# Patient Record
Sex: Male | Born: 1935
Health system: Southern US, Community
[De-identification: ages and names within clinical notes are randomized; demographics above are authoritative.]

## PROBLEM LIST (undated history)

## (undated) DIAGNOSIS — K5732 Diverticulitis of large intestine without perforation or abscess without bleeding: Secondary | ICD-10-CM

## (undated) DIAGNOSIS — N529 Male erectile dysfunction, unspecified: Secondary | ICD-10-CM

## (undated) DIAGNOSIS — E559 Vitamin D deficiency, unspecified: Secondary | ICD-10-CM

## (undated) DIAGNOSIS — I10 Essential (primary) hypertension: Secondary | ICD-10-CM

## (undated) DIAGNOSIS — R739 Hyperglycemia, unspecified: Secondary | ICD-10-CM

## (undated) HISTORY — DX: Hyperglycemia, unspecified: R73.9

## (undated) HISTORY — PX: HERNIA REPAIR: SHX51

## (undated) HISTORY — DX: Diverticulitis of large intestine without perforation or abscess without bleeding: K57.32

## (undated) HISTORY — DX: Essential (primary) hypertension: I10

## (undated) HISTORY — DX: Vitamin D deficiency, unspecified: E55.9

## (undated) HISTORY — DX: Male erectile dysfunction, unspecified: N52.9

## (undated) SURGERY — ENDOSCOPIC RETROGRADE CHOLANGIOPANCREATOGRAPHY (ERCP)
Anesthesia: General

---

## 2009-02-24 ENCOUNTER — Inpatient Hospital Stay: Payer: Self-pay | Admitting: Specialist

## 2011-07-10 ENCOUNTER — Emergency Department: Payer: Self-pay | Admitting: Emergency Medicine

## 2011-10-16 ENCOUNTER — Ambulatory Visit: Payer: Self-pay | Admitting: Gastroenterology

## 2011-10-17 LAB — PATHOLOGY REPORT

## 2013-12-22 ENCOUNTER — Encounter: Payer: Self-pay | Admitting: *Deleted

## 2013-12-29 ENCOUNTER — Encounter (INDEPENDENT_AMBULATORY_CARE_PROVIDER_SITE_OTHER): Payer: Self-pay

## 2013-12-29 ENCOUNTER — Encounter: Payer: Self-pay | Admitting: Cardiovascular Disease

## 2013-12-29 ENCOUNTER — Ambulatory Visit (INDEPENDENT_AMBULATORY_CARE_PROVIDER_SITE_OTHER): Payer: Medicare PPO | Admitting: Cardiovascular Disease

## 2013-12-29 VITALS — BP 152/94 | HR 70 | Ht 72.0 in | Wt 248.2 lb

## 2013-12-29 DIAGNOSIS — R0602 Shortness of breath: Secondary | ICD-10-CM

## 2013-12-29 DIAGNOSIS — R0600 Dyspnea, unspecified: Secondary | ICD-10-CM | POA: Insufficient documentation

## 2013-12-29 DIAGNOSIS — I10 Essential (primary) hypertension: Secondary | ICD-10-CM

## 2013-12-29 MED ORDER — ERGOCALCIFEROL 1.25 MG (50000 UT) PO CAPS: 50000.0000 [IU] | ORAL_CAPSULE | ORAL | Status: DC

## 2013-12-29 NOTE — Assessment & Plan Note (Signed)
BP is elevated but he states that his BP has been well controlled at home He may need addition of HCTZ Advised him to avoid salt and salty foods. Will see him in 6 months.

## 2013-12-29 NOTE — Assessment & Plan Note (Signed)
Blake Rivera presents for further evaluation of his shortness of breath. He has a history of hypertension and has evidence of LVH with repolarization changes on EKG. He also has a long history of cigarette smoking and may have some degree of COPD. We will get an echocardiogram for further evaluation of his LV function and cardiac size. We will also schedule him for set of pulmonary function tests.  He may need to be referred to [facility] pulmonary group if he's found have significant lung disease.  I will see him in 6 months.   Will have him DC his Vit. E since it was not found to be beneficial from a cardiac standpoint.

## 2013-12-29 NOTE — Patient Instructions (Addendum)
Your physician has requested that you have an echocardiogram. Echocardiography is a painless test that uses sound waves to create images of your heart. It provides your doctor with information about the size and shape of your heart and how well your heart's chambers and valves are working. This procedure takes approximately one hour. There are no restrictions for this procedure.   Your physician has recommended you make the following change in your medication:  Stop your Vitamin E   Your physician has recommended that you have a pulmonary function test. Pulmonary Function Tests are a group of tests that measure how well air moves in and out of your lungs. No prep for this test.  You will be contacted with the date and time.   Your physician wants you to follow-up in: 6 months with Dr. Acie Fredrickson. You will receive a reminder letter in the mail two months in advance. If you don't receive a letter, please call our office to schedule the follow-up appointment.

## 2013-12-29 NOTE — Progress Notes (Signed)
London Sheer Date of Birth  1934/10/31       [facility]    [employer] [address], [place], [place]  [address]   [place],   [address] [address]   Fax  [phone]     Fax [phone]  Problem List: 1. Shortness of breath 2. Hypertension  History of Present Illness:  Mr Harkless is a 79 yo who is seen today for further evaluation of his dyspnea.  He describes DOE while playing golf or walking at his grandson's campus [facility] )  Has been having DOE for several months. No CP. Lasts for only a few minuest or until he stops walking.   No sweats, no syncope.   No PND or orthopnea  Developed HTN after he stopped working.  He is a retired Education officer, museum in [place]. Now plays golf 3 times a week.    Takes his BP every day - readings are typically very well controlled. Is higher today.   Smokes 1 pack every 4 days. ETOH - 1-2 drinks at night.    Fhx:  No caridiac problems that he is aware of     Current Outpatient Prescriptions on File Prior to Visit  Medication Sig Dispense Refill  . amLODipine (NORVASC) 5 MG tablet Take 5 mg by mouth daily.    Marland Kitchen aspirin 81 MG tablet Take 81 mg by mouth daily.    . ergocalciferol (VITAMIN D2) 50000 UNITS capsule Take 50,000 Units by mouth once a week.    . Multiple Vitamins-Minerals (CENTRUM SILVER PO) Take by mouth daily.    . tadalafil (CIALIS) 20 MG tablet Take 20 mg by mouth daily as needed for erectile dysfunction.     No current facility-administered medications on file prior to visit.    Allergies  Allergen Reactions  . Lisinopril     Angioedema  . Omacor  [Omega-3-Acid Ethyl Esters]     Other reaction(s): Rash  . Zocor  [Simvastatin]     Muscle Pain    Past Medical History  Diagnosis Date  . Hypertension   . Vitamin D deficiency   . ED (erectile dysfunction)   . Hyperglycemia   . Diverticulitis of colon     Past  Surgical History  Procedure Laterality Date  . Hernia repair      History  Smoking status  . Current Some Day Smoker -- 25 years  Smokeless tobacco  . Not on file    History  Alcohol Use  . Yes    Comment: moderate    Family History  Problem Relation Age of Onset  . Heart disease Mother   . Heart disease Brother     Reviw of Systems:  Reviewed in the HPI.  All other systems are negative.  Physical Exam: Blood pressure 152/94, pulse 70, height 6' (1.829 m), weight 248 lb 4 oz (112.605 kg). Wt Readings from Last 3 Encounters:  12/29/13 248 lb 4 oz (112.605 kg)     General: Well developed, well nourished, in no acute distress.  Head: Normocephalic, atraumatic, sclera non-icteric, mucus membranes are moist,   Neck: Supple. Carotids are 2 + without bruits. No JVD   Lungs: Clear   Heart: RR, no significant murmur   Abdomen: Soft, non-tender, non-distended with normal bowel sounds.  Msk:  Strength and tone are normal   Extremities: No clubbing or cyanosis. No edema.  Distal  pedal pulses are 2+ and equal    Neuro: CN II - XII intact.  Alert and oriented X 3.   Psych:  Normal   ECG: 12/29/2013: Normal sinus rhythm at 70. Voltage for left ventricular hypertrophy with repolarization abnormalities.   Assessment / Plan:

## 2014-01-22 ENCOUNTER — Other Ambulatory Visit (INDEPENDENT_AMBULATORY_CARE_PROVIDER_SITE_OTHER): Payer: Medicare PPO

## 2014-01-22 ENCOUNTER — Other Ambulatory Visit: Payer: Self-pay

## 2014-01-22 DIAGNOSIS — R0602 Shortness of breath: Secondary | ICD-10-CM

## 2014-01-22 DIAGNOSIS — R0600 Dyspnea, unspecified: Secondary | ICD-10-CM

## 2014-01-23 ENCOUNTER — Other Ambulatory Visit: Payer: Medicare PPO

## 2014-07-16 ENCOUNTER — Ambulatory Visit (INDEPENDENT_AMBULATORY_CARE_PROVIDER_SITE_OTHER): Payer: Commercial Managed Care - HMO | Admitting: Family Medicine

## 2014-07-16 ENCOUNTER — Encounter: Payer: Self-pay | Admitting: Family Medicine

## 2014-07-16 VITALS — BP 130/84 | HR 79 | Temp 98.3°F | Ht 72.0 in | Wt 248.2 lb

## 2014-07-16 DIAGNOSIS — N529 Male erectile dysfunction, unspecified: Secondary | ICD-10-CM | POA: Insufficient documentation

## 2014-07-16 DIAGNOSIS — F17200 Nicotine dependence, unspecified, uncomplicated: Secondary | ICD-10-CM | POA: Insufficient documentation

## 2014-07-16 DIAGNOSIS — B029 Zoster without complications: Secondary | ICD-10-CM | POA: Diagnosis not present

## 2014-07-16 DIAGNOSIS — R739 Hyperglycemia, unspecified: Secondary | ICD-10-CM | POA: Insufficient documentation

## 2014-07-16 DIAGNOSIS — R9431 Abnormal electrocardiogram [ECG] [EKG]: Secondary | ICD-10-CM | POA: Insufficient documentation

## 2014-07-16 DIAGNOSIS — R209 Unspecified disturbances of skin sensation: Secondary | ICD-10-CM | POA: Insufficient documentation

## 2014-07-16 DIAGNOSIS — M25569 Pain in unspecified knee: Secondary | ICD-10-CM | POA: Insufficient documentation

## 2014-07-16 DIAGNOSIS — I10 Essential (primary) hypertension: Secondary | ICD-10-CM | POA: Insufficient documentation

## 2014-07-16 DIAGNOSIS — Z8719 Personal history of other diseases of the digestive system: Secondary | ICD-10-CM | POA: Insufficient documentation

## 2014-07-16 DIAGNOSIS — E559 Vitamin D deficiency, unspecified: Secondary | ICD-10-CM | POA: Insufficient documentation

## 2014-07-16 DIAGNOSIS — M25519 Pain in unspecified shoulder: Secondary | ICD-10-CM | POA: Insufficient documentation

## 2014-07-16 MED ORDER — LIDOCAINE 5 % EX OINT: 1.0000 "application " | TOPICAL_OINTMENT | Freq: Two times a day (BID) | CUTANEOUS | Status: DC | PRN

## 2014-07-16 MED ORDER — VALACYCLOVIR HCL 1 G PO TABS: 1000.0000 mg | ORAL_TABLET | Freq: Three times a day (TID) | ORAL | Status: DC

## 2014-07-16 MED ORDER — OXYCODONE-ACETAMINOPHEN 5-325 MG PO TABS: 1.0000 | ORAL_TABLET | Freq: Three times a day (TID) | ORAL | Status: DC | PRN

## 2014-07-16 NOTE — Progress Notes (Signed)
Name: Blake Rivera   MRN: [id number]    DOB: May 01, 1934   Date:07/16/2014       Progress Note  Subjective  Chief Complaint  Chief Complaint  Patient presents with  . Acute Visit    shingles    Rash This is a new problem. The current episode started yesterday. The problem has been gradually worsening since onset. The affected locations include the abdomen (itching and burning preceded the rash in the affected area). The rash is characterized by burning, itchiness and pain. He was exposed to nothing. Pertinent negatives include no fatigue, fever or joint pain. Past treatments include anti-itch cream. The treatment provided no relief.      Past Medical History  Diagnosis Date  . Hypertension   . Vitamin D deficiency   . ED (erectile dysfunction)   . Hyperglycemia   . Diverticulitis of colon     Past Surgical History  Procedure Laterality Date  . Hernia repair      Family History  Problem Relation Age of Onset  . Heart disease Mother   . Heart disease Brother     History   Social History  . Marital Status: Married    Spouse Name: N/A  . Number of Children: N/A  . Years of Education: N/A   Occupational History  . Not on file.   Social History Main Topics  . Smoking status: Current Some Day Smoker -- 25 years  . Smokeless tobacco: Not on file  . Alcohol Use: Yes     Comment: moderate  . Drug Use: No  . Sexual Activity: Not on file   Other Topics Concern  . Not on file   Social History Narrative     Current outpatient prescriptions:  .  amLODipine (NORVASC) 5 MG tablet, Take 5 mg by mouth daily., Disp: , Rfl:  .  aspirin 81 MG tablet, Take 81 mg by mouth daily., Disp: , Rfl:  .  ergocalciferol (VITAMIN D2) 50000 UNITS capsule, Take 1 capsule (50,000 Units total) by mouth once a week., Disp: , Rfl:  .  Multiple Vitamins-Minerals (CENTRUM SILVER PO), Take by mouth daily., Disp: , Rfl:  .  tadalafil (CIALIS) 20 MG tablet, Take 20 mg by mouth daily as  needed for erectile dysfunction., Disp: , Rfl:   Allergies  Allergen Reactions  . Lisinopril     Angioedema  . Omacor  [Omega-3-Acid Ethyl Esters]     Other reaction(s): Rash  . Zocor  [Simvastatin]     Muscle Pain     Review of Systems  Constitutional: Negative for fever and fatigue.  Musculoskeletal: Negative for joint pain.  Skin: Positive for rash.      Objective  Filed Vitals:   07/16/14 1422  BP: 130/84  Pulse: 79  Temp: 98.3 F (36.8 C)  TempSrc: Oral  Height: 6' (1.829 m)  Weight: 248 lb 3.2 oz (112.583 kg)  SpO2: 91%    Physical Exam  Constitutional: He is well-developed, well-nourished, and in no distress.  Skin: Burn and rash noted. Rash is maculopapular. There is erythema.     Erythematous intensely pruritic maculo-papular lesions on the abdomen, c/w early onset shingles.       No results found for this or any previous visit (from the past 2160 hour(s)).   Assessment & Plan 1. Shingles Symptoms consistent with shingles infection. Patient will be started on Valtrex, lidocaine topical for itch relief, and as needed opioid analgesics for relief of pain. Return in one  week if symptoms not improving. - valACYclovir (VALTREX) 1000 MG tablet; Take 1 tablet (1,000 mg total) by mouth 3 (three) times daily.  Dispense: 21 tablet; Refill: 0 - lidocaine (XYLOCAINE) 5 % ointment; Apply 1 application topically 2 (two) times daily as needed.  Dispense: 50 g; Refill: 0 - oxyCODONE-acetaminophen (ROXICET) 5-325 MG per tablet; Take 1 tablet by mouth every 8 (eight) hours as needed for moderate pain or severe pain.  Dispense: 21 tablet; Refill: 0    Eliese Kerwood Asad A[facility] 07/16/2014 2:38 PM

## 2014-07-30 ENCOUNTER — Ambulatory Visit: Payer: Commercial Managed Care - HMO | Admitting: Family Medicine

## 2014-08-19 ENCOUNTER — Ambulatory Visit: Payer: Self-pay | Admitting: Family Medicine

## 2014-08-21 ENCOUNTER — Other Ambulatory Visit: Payer: Self-pay | Admitting: Family Medicine

## 2014-08-24 ENCOUNTER — Other Ambulatory Visit: Payer: Self-pay | Admitting: Family Medicine

## 2014-08-24 MED ORDER — AMLODIPINE BESYLATE 5 MG PO TABS
5.0000 mg | ORAL_TABLET | Freq: Every day | ORAL | Status: DC
Start: 2014-08-24 — End: 2015-02-25

## 2014-08-24 NOTE — Telephone Encounter (Signed)
Medication has been refilled and sent to Pharmacy

## 2014-08-26 ENCOUNTER — Ambulatory Visit (INDEPENDENT_AMBULATORY_CARE_PROVIDER_SITE_OTHER): Payer: Commercial Managed Care - HMO | Admitting: Family Medicine

## 2014-08-26 ENCOUNTER — Encounter: Payer: Self-pay | Admitting: Family Medicine

## 2014-08-26 VITALS — BP 132/77 | HR 71 | Temp 98.2°F | Resp 18 | Ht 72.0 in | Wt 250.0 lb

## 2014-08-26 DIAGNOSIS — Z0000 Encounter for general adult medical examination without abnormal findings: Secondary | ICD-10-CM | POA: Diagnosis not present

## 2014-08-26 NOTE — Progress Notes (Signed)
Name: Blake Rivera   MRN: [id number]    DOB: 08-Dec-1934   Date:08/26/2014       Progress Note  Subjective  Chief Complaint  Chief Complaint  Patient presents with  . Annual Exam    CPE  . Medication Refill    HPI Pt. Is here for a Comprehensive Annual physical exam. He is doing well.  Past Medical History  Diagnosis Date  . Hypertension   . Vitamin D deficiency   . ED (erectile dysfunction)   . Hyperglycemia   . Diverticulitis of colon     Past Surgical History  Procedure Laterality Date  . Hernia repair      Family History  Problem Relation Age of Onset  . Heart disease Mother   . Heart disease Brother     Social History   Social History  . Marital Status: Married    Spouse Name: N/A  . Number of Children: N/A  . Years of Education: N/A   Occupational History  . Not on file.   Social History Main Topics  . Smoking status: Former Smoker -- 25 years    Quit date: 05/18/2014  . Smokeless tobacco: Never Used  . Alcohol Use: 0.0 oz/week    0 Standard drinks or equivalent per week     Comment: moderate  . Drug Use: No  . Sexual Activity: Not on file   Other Topics Concern  . Not on file   Social History Narrative     Current outpatient prescriptions:  .  amLODipine (NORVASC) 5 MG tablet, Take 1 tablet (5 mg total) by mouth daily., Disp: 30 tablet, Rfl: 0 .  aspirin 81 MG tablet, Take 81 mg by mouth daily., Disp: , Rfl:  .  ergocalciferol (VITAMIN D2) 50000 UNITS capsule, Take 1 capsule (50,000 Units total) by mouth once a week., Disp: , Rfl:  .  Multiple Vitamins-Minerals (CENTRUM SILVER PO), Take by mouth daily., Disp: , Rfl:  .  tadalafil (CIALIS) 20 MG tablet, Take 20 mg by mouth daily as needed for erectile dysfunction., Disp: , Rfl:  .  lidocaine (XYLOCAINE) 5 % ointment, Apply 1 application topically 2 (two) times daily as needed. (Patient not taking: Reported on 08/26/2014), Disp: 50 g, Rfl: 0 .  oxyCODONE-acetaminophen (ROXICET) 5-325 MG per  tablet, Take 1 tablet by mouth every 8 (eight) hours as needed for moderate pain or severe pain. (Patient not taking: Reported on 08/26/2014), Disp: 21 tablet, Rfl: 0 .  valACYclovir (VALTREX) 1000 MG tablet, Take 1 tablet (1,000 mg total) by mouth 3 (three) times daily. (Patient not taking: Reported on 08/26/2014), Disp: 21 tablet, Rfl: 0  Allergies  Allergen Reactions  . Lisinopril     Angioedema  . Omacor  [Omega-3-Acid Ethyl Esters]     Other reaction(s): Rash  . Zocor  [Simvastatin]     Muscle Pain     Review of Systems  Constitutional: Negative for fever, chills, weight loss and malaise/fatigue.  HENT: Negative for ear pain and sore throat.   Eyes: Negative for blurred vision.  Respiratory: Negative for cough, sputum production and shortness of breath.   Cardiovascular: Negative for chest pain and palpitations.  Gastrointestinal: Negative for heartburn, nausea, vomiting, abdominal pain, diarrhea, constipation and blood in stool.  Genitourinary: Negative for dysuria, frequency and hematuria.  Musculoskeletal: Positive for myalgias. Negative for back pain and neck pain.  Skin: Negative for rash.  Neurological: Negative for dizziness, tingling, seizures, loss of consciousness and headaches.  Endo/Heme/Allergies: Does  not bruise/bleed easily.  Psychiatric/Behavioral: Negative for depression. The patient is not nervous/anxious.      Objective  Filed Vitals:   08/26/14 0908  BP: 132/77  Pulse: 71  Temp: 98.2 F (36.8 C)  TempSrc: Oral  Resp: 18  Height: 6' (1.829 m)  Weight: 250 lb (113.399 kg)  SpO2: 93%    Physical Exam  Constitutional: He is oriented to person, place, and time and well-developed, well-nourished, and in no distress.  HENT:  Head: Normocephalic.  Eyes: Pupils are equal, round, and reactive to light.  Neck: Normal range of motion. Neck supple.  Cardiovascular: Normal rate and regular rhythm.   Pulmonary/Chest: Effort normal and breath sounds normal.   Abdominal: Soft. Bowel sounds are normal.  Genitourinary: Rectum normal and prostate normal.  Musculoskeletal: Normal range of motion. He exhibits no edema or tenderness.  Neurological: He is alert and oriented to person, place, and time.  Skin: Skin is warm and dry.  Psychiatric: Memory, affect and judgment normal.  Nursing note and vitals reviewed.    Assessment & Plan  1. Annual physical exam Last colonoscopy was in 2012. Hemoccult testing in office today was negative.  - CBC with Differential - Comprehensive metabolic panel - HgB A0O - Vitamin D (25 hydroxy) - PSA - TSH - Lipid Profile   Yarrow Linhart Asad A[facility] 08/26/2014 9:42 AM

## 2014-08-27 LAB — CBC WITH DIFFERENTIAL/PLATELET
BASOS ABS: 0 10*3/uL (ref 0.0–0.2)
Basos: 0 %
EOS (ABSOLUTE): 0.1 10*3/uL (ref 0.0–0.4)
EOS: 2 %
HEMOGLOBIN: 15.1 g/dL (ref 12.6–17.7)
Hematocrit: 46 % (ref 37.5–51.0)
Immature Grans (Abs): 0 10*3/uL (ref 0.0–0.1)
Immature Granulocytes: 0 %
LYMPHS ABS: 2.3 10*3/uL (ref 0.7–3.1)
Lymphs: 39 %
MCH: 29.6 pg (ref 26.6–33.0)
MCHC: 32.8 g/dL (ref 31.5–35.7)
MCV: 90 fL (ref 79–97)
MONOCYTES: 8 %
Monocytes Absolute: 0.5 10*3/uL (ref 0.1–0.9)
NEUTROS ABS: 2.9 10*3/uL (ref 1.4–7.0)
Neutrophils: 51 %
Platelets: 202 10*3/uL (ref 150–379)
RBC: 5.1 x10E6/uL (ref 4.14–5.80)
RDW: 14.2 % (ref 12.3–15.4)
WBC: 5.9 10*3/uL (ref 3.4–10.8)

## 2014-08-27 LAB — COMPREHENSIVE METABOLIC PANEL
A/G RATIO: 1.7 (ref 1.1–2.5)
ALBUMIN: 4.5 g/dL (ref 3.5–4.8)
ALK PHOS: 75 IU/L (ref 39–117)
ALT: 20 IU/L (ref 0–44)
AST: 25 IU/L (ref 0–40)
BILIRUBIN TOTAL: 0.4 mg/dL (ref 0.0–1.2)
BUN / CREAT RATIO: 8 — AB (ref 10–22)
BUN: 9 mg/dL (ref 8–27)
CHLORIDE: 101 mmol/L (ref 97–108)
CO2: 28 mmol/L (ref 18–29)
Calcium: 9.4 mg/dL (ref 8.6–10.2)
Creatinine, Ser: 1.16 mg/dL (ref 0.76–1.27)
GFR, EST AFRICAN AMERICAN: 69 mL/min/{1.73_m2} (ref >59)
GFR, EST NON AFRICAN AMERICAN: 60 mL/min/{1.73_m2} (ref >59)
GLUCOSE: 117 mg/dL — AB (ref 65–99)
Globulin, Total: 2.6 g/dL (ref 1.5–4.5)
Potassium: 4.9 mmol/L (ref 3.5–5.2)
Sodium: 140 mmol/L (ref 134–144)
Total Protein: 7.1 g/dL (ref 6.0–8.5)

## 2014-08-27 LAB — HEMOGLOBIN A1C
ESTIMATED AVERAGE GLUCOSE: 134 mg/dL
HEMOGLOBIN A1C: 6.3 % — AB (ref 4.8–5.6)

## 2014-08-27 LAB — LIPID PANEL
Chol/HDL Ratio: 3.9 ratio units (ref 0.0–5.0)
Cholesterol, Total: 195 mg/dL (ref 100–199)
HDL: 50 mg/dL (ref >39)
LDL Calculated: 116 mg/dL — ABNORMAL HIGH (ref 0–99)
TRIGLYCERIDES: 147 mg/dL (ref 0–149)
VLDL Cholesterol Cal: 29 mg/dL (ref 5–40)

## 2014-08-27 LAB — TSH: TSH: 2 u[IU]/mL (ref 0.450–4.500)

## 2014-08-27 LAB — PSA: PROSTATE SPECIFIC AG, SERUM: 1.5 ng/mL (ref 0.0–4.0)

## 2014-08-27 LAB — VITAMIN D 25 HYDROXY (VIT D DEFICIENCY, FRACTURES): Vit D, 25-Hydroxy: 20.2 ng/mL — ABNORMAL LOW (ref 30.0–100.0)

## 2014-08-28 ENCOUNTER — Telehealth: Payer: Self-pay | Admitting: Family Medicine

## 2014-08-28 MED ORDER — ERGOCALCIFEROL 1.25 MG (50000 UT) PO CAPS: 50000.0000 [IU] | ORAL_CAPSULE | ORAL | Status: DC

## 2014-08-28 NOTE — Telephone Encounter (Signed)
Medication has been refilled and sent to [facility] [address]

## 2014-08-28 NOTE — Telephone Encounter (Signed)
Patient was seen the other day for his annual CPE. Was told that his Vitamin D level was low. States that he normally get a prescription for the Vitamin D. Please send to [facility]. Also, patient is requesting a copy of his physical.

## 2014-11-10 ENCOUNTER — Telehealth: Payer: Self-pay | Admitting: Cardiovascular Disease

## 2014-11-10 NOTE — Telephone Encounter (Signed)
3 attempts made to schedule OD fu appt.  LM with spouse for patient to schedule.  Patient will call office when ready to schedule appt.     Deleting Recall.

## 2014-11-17 ENCOUNTER — Other Ambulatory Visit: Payer: Self-pay | Admitting: Family Medicine

## 2014-11-25 ENCOUNTER — Encounter: Payer: Self-pay | Admitting: Family Medicine

## 2014-11-25 ENCOUNTER — Ambulatory Visit (INDEPENDENT_AMBULATORY_CARE_PROVIDER_SITE_OTHER): Payer: Commercial Managed Care - HMO | Admitting: Family Medicine

## 2014-11-25 VITALS — BP 132/77 | HR 96 | Temp 99.1°F | Resp 18 | Ht 72.0 in | Wt 250.9 lb

## 2014-11-25 DIAGNOSIS — E559 Vitamin D deficiency, unspecified: Secondary | ICD-10-CM | POA: Diagnosis not present

## 2014-11-25 DIAGNOSIS — R972 Elevated prostate specific antigen [PSA]: Secondary | ICD-10-CM | POA: Diagnosis not present

## 2014-11-25 DIAGNOSIS — E785 Hyperlipidemia, unspecified: Secondary | ICD-10-CM

## 2014-11-25 MED ORDER — ATORVASTATIN CALCIUM 10 MG PO TABS: 10.0000 mg | ORAL_TABLET | Freq: Every day | ORAL | Status: DC

## 2014-11-25 NOTE — Progress Notes (Signed)
Name: Blake Rivera   MRN: [id number]    DOB: [birth year]   Date:11/25/2014       Progress Note  Subjective  Chief Complaint  Chief Complaint  Patient presents with  . Medication Refill    vitamin D, check labs first    Hyperlipidemia This is a chronic problem. The problem is uncontrolled. Recent lipid tests were reviewed and are high. Exacerbating diseases include obesity. Pertinent negatives include no myalgias. He is currently on no antihyperlipidemic treatment. Risk factors for coronary artery disease include dyslipidemia, obesity, male sex and hypertension.   Vitamin D Deficiency Pt. Is here for follow up of Vitamin D deficiency. Last level, obtained in August 2016 was 20.2, following which, he was started on Vitamin D3 50000 units every 12 weeks. He has finished the prescription and now presenting for recheck.  Increase in PSA Pt. Presents for recheck of PSA. It was 1.5 in August 2016, which was higher than 0.9 in August 2015. Pt. Has no history of prostate cancer, no LUTS (dysuria, irritative voiding, change in stream etc). He presents to recheck PSA.  Past Medical History  Diagnosis Date  . Hypertension   . Vitamin D deficiency   . ED (erectile dysfunction)   . Hyperglycemia   . Diverticulitis of colon     Past Surgical History  Procedure Laterality Date  . Hernia repair      Family History  Problem Relation Age of Onset  . Heart disease Mother   . Heart disease Brother     Social History   Social History  . Marital Status: Married    Spouse Name: N/A  . Number of Children: N/A  . Years of Education: N/A   Occupational History  . Not on file.   Social History Main Topics  . Smoking status: Former Smoker -- 25 years    Quit date: 05/18/2014  . Smokeless tobacco: Never Used  . Alcohol Use: 0.0 oz/week    0 Standard drinks or equivalent per week     Comment: moderate  . Drug Use: No  . Sexual Activity: Not on file   Other Topics Concern  . Not on  file   Social History Narrative     Current outpatient prescriptions:  .  amLODipine (NORVASC) 5 MG tablet, Take 1 tablet (5 mg total) by mouth daily., Disp: 30 tablet, Rfl: 0 .  aspirin 81 MG tablet, Take 81 mg by mouth daily., Disp: , Rfl:  .  Multiple Vitamins-Minerals (CENTRUM SILVER PO), Take by mouth daily., Disp: , Rfl:  .  tadalafil (CIALIS) 20 MG tablet, Take 20 mg by mouth daily as needed for erectile dysfunction., Disp: , Rfl:  .  ergocalciferol (VITAMIN D2) 50000 UNITS capsule, Take 1 capsule (50,000 Units total) by mouth once a week. For 12 weeks (Patient not taking: Reported on 11/25/2014), Disp: 12 capsule, Rfl: 0 .  lidocaine (XYLOCAINE) 5 % ointment, Apply 1 application topically 2 (two) times daily as needed. (Patient not taking: Reported on 08/26/2014), Disp: 50 g, Rfl: 0 .  oxyCODONE-acetaminophen (ROXICET) 5-325 MG per tablet, Take 1 tablet by mouth every 8 (eight) hours as needed for moderate pain or severe pain. (Patient not taking: Reported on 08/26/2014), Disp: 21 tablet, Rfl: 0 .  valACYclovir (VALTREX) 1000 MG tablet, Take 1 tablet (1,000 mg total) by mouth 3 (three) times daily. (Patient not taking: Reported on 08/26/2014), Disp: 21 tablet, Rfl: 0  Allergies  Allergen Reactions  . Lisinopril  Angioedema  . Omacor  [Omega-3-Acid Ethyl Esters]     Other reaction(s): Rash  . Zocor  [Simvastatin]     Muscle Pain   Review of Systems  Constitutional: Negative for fever, chills and malaise/fatigue.  Genitourinary: Negative for dysuria and hematuria.  Musculoskeletal: Negative for myalgias and joint pain.   Objective  Filed Vitals:   11/25/14 0853  BP: 132/77  Pulse: 96  Temp: 99.1 F (37.3 C)  TempSrc: Oral  Resp: 18  Height: 6' (1.829 m)  Weight: 250 lb 14.4 oz (113.807 kg)  SpO2: 98%    Physical Exam  Constitutional: He is oriented to person, place, and time and well-developed, well-nourished, and in no distress.  HENT:  Head: Normocephalic and  atraumatic.  Cardiovascular: Normal rate.   Pulmonary/Chest: Effort normal and breath sounds normal. He has no wheezes.  Abdominal: Soft. Bowel sounds are normal. There is no tenderness.  Neurological: He is alert and oriented to person, place, and time.  Nursing note and vitals reviewed.   Assessment & Plan  1. Avitaminosis D  - Vitamin D (25 hydroxy)  2. Dyslipidemia  - atorvastatin (LIPITOR) 10 MG tablet; Take 1 tablet (10 mg total) by mouth daily.  Dispense: 90 tablet; Refill: 0  3. Rising PSA level  - PSA   Claiborne Stroble Asad A. [facility] 11/25/2014 9:02 AM

## 2014-11-26 ENCOUNTER — Telehealth: Payer: Self-pay | Admitting: Family Medicine

## 2014-11-26 LAB — VITAMIN D 25 HYDROXY (VIT D DEFICIENCY, FRACTURES): Vit D, 25-Hydroxy: 28.5 ng/mL — ABNORMAL LOW (ref 30.0–100.0)

## 2014-11-26 LAB — PSA: Prostate Specific Ag, Serum: 2.2 ng/mL (ref 0.0–4.0)

## 2014-11-26 NOTE — Telephone Encounter (Signed)
Pt would like for his Vit D to be called into [facility] [address]

## 2014-11-26 NOTE — Telephone Encounter (Signed)
Lab results have been reported to patient and he has been informed to take OTC Vitamin D 2000 units daily and we will recheck levels in 3-4 months per Dr. Manuella Ghazi

## 2015-02-25 ENCOUNTER — Ambulatory Visit (INDEPENDENT_AMBULATORY_CARE_PROVIDER_SITE_OTHER): Payer: Medicare Other | Admitting: Family Medicine

## 2015-02-25 ENCOUNTER — Encounter: Payer: Self-pay | Admitting: Family Medicine

## 2015-02-25 VITALS — BP 152/94 | HR 107 | Temp 98.5°F | Resp 20 | Ht 72.0 in | Wt 249.6 lb

## 2015-02-25 DIAGNOSIS — I10 Essential (primary) hypertension: Secondary | ICD-10-CM | POA: Diagnosis not present

## 2015-02-25 MED ORDER — AMLODIPINE BESYLATE 10 MG PO TABS: 10.0000 mg | ORAL_TABLET | Freq: Every day | ORAL | Status: DC

## 2015-02-25 NOTE — Progress Notes (Signed)
Name: Blake Rivera   MRN: [id number]    DOB: 08/06/1934   Date:02/25/2015       Progress Note  Subjective  Chief Complaint  Chief Complaint  Patient presents with  . Follow-up    3 mo / BP  . Hypertension  . Hyperlipidemia    Hypertension This is a chronic problem. The problem is uncontrolled. Pertinent negatives include no blurred vision, chest pain, headaches, palpitations or shortness of breath. Past treatments include calcium channel blockers.   BP elevated to 138/70 my repeat check 10 minutes later was 152/94. Patient usually has excellent blood pressure control, values ranging between 120 to 130 over 70s to 80s.  Past Medical History  Diagnosis Date  . Hypertension   . Vitamin D deficiency   . ED (erectile dysfunction)   . Hyperglycemia   . Diverticulitis of colon     Past Surgical History  Procedure Laterality Date  . Hernia repair      Family History  Problem Relation Age of Onset  . Heart disease Mother   . Heart disease Brother     Social History   Social History  . Marital Status: Married    Spouse Name: N/A  . Number of Children: N/A  . Years of Education: N/A   Occupational History  . Not on file.   Social History Main Topics  . Smoking status: Former Smoker -- 25 years    Quit date: 05/18/2014  . Smokeless tobacco: Never Used  . Alcohol Use: 0.0 oz/week    0 Standard drinks or equivalent per week     Comment: moderate  . Drug Use: No  . Sexual Activity: Not on file   Other Topics Concern  . Not on file   Social History Narrative     Current outpatient prescriptions:  .  amLODipine (NORVASC) 5 MG tablet, Take 1 tablet (5 mg total) by mouth daily., Disp: 30 tablet, Rfl: 0 .  aspirin 81 MG tablet, Take 81 mg by mouth daily., Disp: , Rfl:  .  atorvastatin (LIPITOR) 10 MG tablet, Take 1 tablet (10 mg total) by mouth daily., Disp: 90 tablet, Rfl: 0 .  Multiple Vitamins-Minerals (CENTRUM SILVER PO), Take by mouth daily., Disp: , Rfl:  .   tadalafil (CIALIS) 20 MG tablet, Take 20 mg by mouth daily as needed for erectile dysfunction., Disp: , Rfl:   Allergies  Allergen Reactions  . Lisinopril     Angioedema  . Omacor  [Omega-3-Acid Ethyl Esters]     Other reaction(s): Rash  . Zocor  [Simvastatin]     Muscle Pain     Review of Systems  Eyes: Negative for blurred vision.  Respiratory: Negative for shortness of breath.   Cardiovascular: Negative for chest pain and palpitations.  Neurological: Negative for headaches.     Objective  Filed Vitals:   02/25/15 0824  BP: 138/70  Pulse: 107  Temp: 98.5 F (36.9 C)  TempSrc: Oral  Resp: 20  Height: 6' (1.829 m)  Weight: 249 lb 9.6 oz (113.218 kg)  SpO2: 96%    Physical Exam  Constitutional: He is oriented to person, place, and time and well-developed, well-nourished, and in no distress.  HENT:  Head: Normocephalic and atraumatic.  Cardiovascular: Normal rate and regular rhythm.   Pulmonary/Chest: Effort normal and breath sounds normal.  Musculoskeletal: He exhibits no edema.  Neurological: He is alert and oriented to person, place, and time.  Nursing note and vitals reviewed.    Assessment &  Plan  1. Benign hypertension We will increase amlodipine to 10 mg daily, patient advised to check blood pressure frequently and return in one month. - amLODipine (NORVASC) 10 MG tablet; Take 1 tablet (10 mg total) by mouth daily.  Dispense: 30 tablet; Refill: 2   Amron Guerrette Asad A. [facility] 02/25/2015 8:45 AM

## 2015-03-23 ENCOUNTER — Ambulatory Visit: Payer: Commercial Managed Care - HMO | Admitting: Family Medicine

## 2015-03-25 ENCOUNTER — Ambulatory Visit (INDEPENDENT_AMBULATORY_CARE_PROVIDER_SITE_OTHER): Payer: Medicare Other | Admitting: Family Medicine

## 2015-03-25 ENCOUNTER — Encounter: Payer: Self-pay | Admitting: Family Medicine

## 2015-03-25 VITALS — BP 118/82 | HR 71 | Temp 98.6°F | Resp 16 | Ht 72.0 in | Wt 253.0 lb

## 2015-03-25 DIAGNOSIS — I10 Essential (primary) hypertension: Secondary | ICD-10-CM | POA: Diagnosis not present

## 2015-03-25 MED ORDER — AMLODIPINE BESYLATE 10 MG PO TABS
10.0000 mg | ORAL_TABLET | Freq: Every day | ORAL | Status: DC
Start: 2015-03-25 — End: 2015-09-16

## 2015-03-25 NOTE — Progress Notes (Signed)
Name: Blake Rivera   MRN: [id number]    DOB: Jun 29, 1934   Date:03/25/2015       Progress Note  Subjective  Chief Complaint  Chief Complaint  Patient presents with  . Follow-up    1 month  . Hypertension    increased Amlodipine to 10 mg last visit    Hypertension This is a chronic problem. The problem is controlled. Pertinent negatives include no blurred vision, chest pain, headaches or palpitations. Past treatments include calcium channel blockers. There are no compliance problems.  There is no history of kidney disease, CAD/MI or CVA.    Past Medical History  Diagnosis Date  . Hypertension   . Vitamin D deficiency   . ED (erectile dysfunction)   . Hyperglycemia   . Diverticulitis of colon     Past Surgical History  Procedure Laterality Date  . Hernia repair      Family History  Problem Relation Age of Onset  . Heart disease Mother   . Heart disease Brother     Social History   Social History  . Marital Status: Married    Spouse Name: N/A  . Number of Children: N/A  . Years of Education: N/A   Occupational History  . Not on file.   Social History Main Topics  . Smoking status: Former Smoker -- 25 years    Quit date: 05/18/2014  . Smokeless tobacco: Never Used  . Alcohol Use: 0.0 oz/week    0 Standard drinks or equivalent per week     Comment: moderate  . Drug Use: No  . Sexual Activity: Not on file   Other Topics Concern  . Not on file   Social History Narrative     Current outpatient prescriptions:  .  amLODipine (NORVASC) 10 MG tablet, Take 1 tablet (10 mg total) by mouth daily., Disp: 30 tablet, Rfl: 2 .  aspirin 81 MG tablet, Take 81 mg by mouth daily., Disp: , Rfl:  .  atorvastatin (LIPITOR) 10 MG tablet, Take 1 tablet (10 mg total) by mouth daily., Disp: 90 tablet, Rfl: 0 .  Multiple Vitamins-Minerals (CENTRUM SILVER PO), Take by mouth daily., Disp: , Rfl:  .  tadalafil (CIALIS) 20 MG tablet, Take 20 mg by mouth daily as needed for  erectile dysfunction., Disp: , Rfl:   Allergies  Allergen Reactions  . Lisinopril     Angioedema  . Omacor  [Omega-3-Acid Ethyl Esters]     Other reaction(s): Rash  . Zocor  [Simvastatin]     Muscle Pain     Review of Systems  Eyes: Negative for blurred vision.  Cardiovascular: Negative for chest pain, palpitations and leg swelling.  Neurological: Negative for headaches.     Objective  Filed Vitals:   03/25/15 0833  BP: 118/82  Pulse: 71  Temp: 98.6 F (37 C)  TempSrc: Oral  Resp: 16  Height: 6' (1.829 m)  Weight: 253 lb (114.76 kg)  SpO2: 92%    Physical Exam  Constitutional: He is oriented to person, place, and time and well-developed, well-nourished, and in no distress.  Cardiovascular: Normal rate and regular rhythm.   Pulmonary/Chest: Effort normal and breath sounds normal.  Musculoskeletal:       Right ankle: He exhibits no swelling.       Left ankle: He exhibits no swelling.  Neurological: He is alert and oriented to person, place, and time.  Nursing note and vitals reviewed.      Assessment & Plan  1.  Essential hypertension Blood pressure at goal. - amLODipine (NORVASC) 10 MG tablet; Take 1 tablet (10 mg total) by mouth daily.  Dispense: 90 tablet; Refill: 1    Saira Kramme [facility] 03/25/2015 8:54 AM

## 2015-09-16 ENCOUNTER — Other Ambulatory Visit: Payer: Self-pay | Admitting: Family Medicine

## 2015-09-16 DIAGNOSIS — I10 Essential (primary) hypertension: Secondary | ICD-10-CM

## 2015-11-01 ENCOUNTER — Telehealth: Payer: Self-pay | Admitting: Family Medicine

## 2015-11-01 NOTE — Telephone Encounter (Signed)
Called Pt to schedule AWV with [facility] and CPE with [id number]

## 2016-02-07 ENCOUNTER — Ambulatory Visit (INDEPENDENT_AMBULATORY_CARE_PROVIDER_SITE_OTHER): Payer: Medicare Other | Admitting: Family Medicine

## 2016-02-07 ENCOUNTER — Encounter: Payer: Self-pay | Admitting: Family Medicine

## 2016-02-07 DIAGNOSIS — R05 Cough: Secondary | ICD-10-CM | POA: Diagnosis not present

## 2016-02-07 DIAGNOSIS — R058 Other specified cough: Secondary | ICD-10-CM | POA: Insufficient documentation

## 2016-02-07 MED ORDER — AZITHROMYCIN 250 MG PO TABS: ORAL_TABLET | ORAL | 0 refills | Status: DC

## 2016-02-07 MED ORDER — GUAIFENESIN-CODEINE 100-10 MG/5ML PO SYRP: 10.0000 mL | ORAL_SOLUTION | Freq: Two times a day (BID) | ORAL | 0 refills | Status: DC | PRN

## 2016-02-07 NOTE — Progress Notes (Signed)
Name: Blake Rivera   MRN: [id number]    DOB: 10/19/1934   Date:02/07/2016       Progress Note  Subjective  Chief Complaint  Chief Complaint  Patient presents with  . Cough    x3 weeks  . Nasal Congestion    Cough  This is a new problem. The current episode started 1 to 4 weeks ago (3 weeks ago). The problem has been gradually worsening. The cough is productive of sputum. Associated symptoms include chills, postnasal drip, a sore throat and wheezing. Pertinent negatives include no ear pain, fever, nasal congestion or shortness of breath. He has tried OTC cough suppressant (Mucinex and Nyquil) for the symptoms.    Past Medical History:  Diagnosis Date  . Diverticulitis of colon   . ED (erectile dysfunction)   . Hyperglycemia   . Hypertension   . Vitamin D deficiency     Past Surgical History:  Procedure Laterality Date  . HERNIA REPAIR      Family History  Problem Relation Age of Onset  . Heart disease Mother   . Heart disease Brother     Social History   Social History  . Marital status: Married    Spouse name: N/A  . Number of children: N/A  . Years of education: N/A   Occupational History  . Not on file.   Social History Main Topics  . Smoking status: Former Smoker    Years: 25.00    Quit date: 05/18/2014  . Smokeless tobacco: Never Used  . Alcohol use 0.0 oz/week     Comment: moderate  . Drug use: No  . Sexual activity: Not on file   Other Topics Concern  . Not on file   Social History Narrative  . No narrative on file     Current Outpatient Prescriptions:  .  amLODipine (NORVASC) 10 MG tablet, TAKE 1 TABLET (10 MG TOTAL) BY MOUTH DAILY., Disp: 90 tablet, Rfl: 1 .  aspirin 81 MG tablet, Take 81 mg by mouth daily., Disp: , Rfl:  .  Multiple Vitamins-Minerals (CENTRUM SILVER PO), Take by mouth daily., Disp: , Rfl:  .  tadalafil (CIALIS) 20 MG tablet, Take 20 mg by mouth daily as needed for erectile dysfunction., Disp: , Rfl:  .  atorvastatin  (LIPITOR) 10 MG tablet, Take 1 tablet (10 mg total) by mouth daily. (Patient not taking: Reported on 02/07/2016), Disp: 90 tablet, Rfl: 0  Allergies  Allergen Reactions  . Lisinopril     Angioedema  . Omacor  [Omega-3-Acid Ethyl Esters]     Other reaction(s): Rash  . Zocor  [Simvastatin]     Muscle Pain     Review of Systems  Constitutional: Positive for chills. Negative for fever.  HENT: Positive for postnasal drip and sore throat. Negative for ear pain.   Respiratory: Positive for cough and wheezing. Negative for shortness of breath.     Objective  Vitals:   02/07/16 0955  BP: 120/63  Pulse: 65  Resp: 16  Temp: 98.6 F (37 C)  TempSrc: Oral  SpO2: 96%  Weight: 249 lb 4.8 oz (113.1 kg)  Height: 6' (1.829 m)    Physical Exam  Constitutional: He is well-developed, well-nourished, and in no distress.  Nursing note and vitals reviewed.       Assessment & Plan  1. Productive cough  - azithromycin (ZITHROMAX) 250 MG tablet; 2 tabs po day 1,then 1 tab po q day x 4 days  Dispense: 6 each; Refill:  0 - guaiFENesin-codeine (CHERATUSSIN AC) 100-10 MG/5ML syrup; Take 10 mLs by mouth 2 (two) times daily as needed for cough.  Dispense: 100 mL; Refill: 0   Maxi Rodas [facility] 02/07/2016 10:12 AM

## 2016-02-08 ENCOUNTER — Ambulatory Visit: Payer: Medicare Other | Admitting: Family Medicine

## 2016-03-23 ENCOUNTER — Other Ambulatory Visit: Payer: Self-pay | Admitting: Family Medicine

## 2016-03-23 DIAGNOSIS — I10 Essential (primary) hypertension: Secondary | ICD-10-CM

## 2016-06-22 DIAGNOSIS — M79674 Pain in right toe(s): Secondary | ICD-10-CM | POA: Diagnosis not present

## 2016-06-22 DIAGNOSIS — M79675 Pain in left toe(s): Secondary | ICD-10-CM | POA: Diagnosis not present

## 2016-06-22 DIAGNOSIS — B351 Tinea unguium: Secondary | ICD-10-CM | POA: Diagnosis not present

## 2016-07-03 ENCOUNTER — Encounter: Payer: Self-pay | Admitting: Family Medicine

## 2016-07-03 ENCOUNTER — Ambulatory Visit (INDEPENDENT_AMBULATORY_CARE_PROVIDER_SITE_OTHER): Payer: Medicare Other | Admitting: Family Medicine

## 2016-07-03 VITALS — BP 122/72 | HR 68 | Temp 98.3°F | Resp 16 | Ht 72.0 in | Wt 243.8 lb

## 2016-07-03 DIAGNOSIS — Z0000 Encounter for general adult medical examination without abnormal findings: Secondary | ICD-10-CM

## 2016-07-03 DIAGNOSIS — Z125 Encounter for screening for malignant neoplasm of prostate: Secondary | ICD-10-CM | POA: Diagnosis not present

## 2016-07-03 DIAGNOSIS — E559 Vitamin D deficiency, unspecified: Secondary | ICD-10-CM | POA: Diagnosis not present

## 2016-07-03 DIAGNOSIS — R7303 Prediabetes: Secondary | ICD-10-CM

## 2016-07-03 DIAGNOSIS — I10 Essential (primary) hypertension: Secondary | ICD-10-CM | POA: Diagnosis not present

## 2016-07-03 LAB — COMPLETE METABOLIC PANEL WITH GFR
ALBUMIN: 4.2 g/dL (ref 3.6–5.1)
ALK PHOS: 79 U/L (ref 40–115)
ALT: 16 U/L (ref 9–46)
AST: 21 U/L (ref 10–35)
BILIRUBIN TOTAL: 0.6 mg/dL (ref 0.2–1.2)
BUN: 10 mg/dL (ref 7–25)
CALCIUM: 9 mg/dL (ref 8.6–10.3)
CO2: 30 mmol/L (ref 20–31)
CREATININE: 1.14 mg/dL — AB (ref 0.70–1.11)
Chloride: 104 mmol/L (ref 98–110)
GFR, Est African American: 70 mL/min (ref >=60)
GFR, Est Non African American: 60 mL/min (ref >=60)
Glucose, Bld: 92 mg/dL (ref 65–99)
Potassium: 4.4 mmol/L (ref 3.5–5.3)
Sodium: 141 mmol/L (ref 135–146)
TOTAL PROTEIN: 7.2 g/dL (ref 6.1–8.1)

## 2016-07-03 LAB — LIPID PANEL
CHOLESTEROL: 181 mg/dL (ref <200)
HDL: 45 mg/dL (ref >40)
LDL Cholesterol: 112 mg/dL — ABNORMAL HIGH (ref <100)
Total CHOL/HDL Ratio: 4 Ratio (ref <5.0)
Triglycerides: 122 mg/dL (ref <150)
VLDL: 24 mg/dL (ref <30)

## 2016-07-03 LAB — CBC WITH DIFFERENTIAL/PLATELET
BASOS PCT: 0 %
Basophils Absolute: 0 cells/uL (ref 0–200)
EOS ABS: 50 {cells}/uL (ref 15–500)
Eosinophils Relative: 1 %
HEMATOCRIT: 46.1 % (ref 38.5–50.0)
HEMOGLOBIN: 14.9 g/dL (ref 13.2–17.1)
LYMPHS ABS: 2500 {cells}/uL (ref 850–3900)
Lymphocytes Relative: 50 %
MCH: 28.7 pg (ref 27.0–33.0)
MCHC: 32.3 g/dL (ref 32.0–36.0)
MCV: 88.8 fL (ref 80.0–100.0)
MONO ABS: 400 {cells}/uL (ref 200–950)
MPV: 8.5 fL (ref 7.5–12.5)
Monocytes Relative: 8 %
Neutro Abs: 2050 cells/uL (ref 1500–7800)
Neutrophils Relative %: 41 %
Platelets: 190 10*3/uL (ref 140–400)
RBC: 5.19 MIL/uL (ref 4.20–5.80)
RDW: 14.3 % (ref 11.0–15.0)
WBC: 5 10*3/uL (ref 3.8–10.8)

## 2016-07-03 LAB — POCT GLYCOSYLATED HEMOGLOBIN (HGB A1C): HEMOGLOBIN A1C: 6.3

## 2016-07-03 LAB — TSH: TSH: 2.04 mIU/L (ref 0.40–4.50)

## 2016-07-03 NOTE — Progress Notes (Signed)
Name: Blake Rivera   MRN: [id number]    DOB: 1934-03-05   Date:07/03/2016       Progress Note  Subjective  Chief Complaint  Chief Complaint  Patient presents with  . Annual Exam    CPE    HPI  Pt. Presents for a Complete Physical Exam.  Last colonoscopy was in 2013, He is due for prostate exam and prostate cancer screening   Past Medical History:  Diagnosis Date  . Diverticulitis of colon   . ED (erectile dysfunction)   . Hyperglycemia   . Hypertension   . Vitamin D deficiency     Past Surgical History:  Procedure Laterality Date  . HERNIA REPAIR      Family History  Problem Relation Age of Onset  . Heart disease Mother   . Heart disease Brother     Social History   Social History  . Marital status: Married    Spouse name: N/A  . Number of children: N/A  . Years of education: N/A   Occupational History  . Not on file.   Social History Main Topics  . Smoking status: Former Smoker    Years: 25.00    Quit date: 05/18/2014  . Smokeless tobacco: Never Used  . Alcohol use 0.0 oz/week     Comment: moderate  . Drug use: No  . Sexual activity: Not on file   Other Topics Concern  . Not on file   Social History Narrative  . No narrative on file     Current Outpatient Prescriptions:  .  amLODipine (NORVASC) 10 MG tablet, TAKE 1 TABLET (10 MG TOTAL) BY MOUTH DAILY., Disp: 90 tablet, Rfl: 1 .  aspirin 81 MG tablet, Take 81 mg by mouth daily., Disp: , Rfl:  .  Multiple Vitamins-Minerals (CENTRUM SILVER PO), Take by mouth daily., Disp: , Rfl:  .  tadalafil (CIALIS) 20 MG tablet, Take 20 mg by mouth daily as needed for erectile dysfunction., Disp: , Rfl:   Allergies  Allergen Reactions  . Lisinopril     Angioedema  . Omacor  [Omega-3-Acid Ethyl Esters]     Other reaction(s): Rash  . Zocor  [Simvastatin]     Muscle Pain     Review of Systems  Constitutional: Positive for malaise/fatigue. Negative for chills and fever.  HENT: Negative for  congestion, ear pain and sore throat.   Eyes: Negative for blurred vision and double vision.  Respiratory: Negative for cough and shortness of breath.   Cardiovascular: Negative for chest pain and leg swelling.  Gastrointestinal: Negative for blood in stool, constipation, diarrhea, nausea and vomiting.  Genitourinary: Negative for dysuria and hematuria.  Musculoskeletal: Negative for back pain and neck pain.  Neurological: Negative for dizziness and headaches.  Psychiatric/Behavioral: Negative for depression. The patient is not nervous/anxious and does not have insomnia.     Objective  Vitals:   07/03/16 1407  BP: 122/72  Pulse: 68  Resp: 16  Temp: 98.3 F (36.8 C)  TempSrc: Oral  SpO2: 97%  Weight: 243 lb 12.8 oz (110.6 kg)  Height: 6' (1.829 m)    Physical Exam  Constitutional: He is oriented to person, place, and time and well-developed, well-nourished, and in no distress.  HENT:  Head: Normocephalic and atraumatic.  Right Ear: External ear normal.  Left Ear: External ear normal.  Mouth/Throat: Oropharynx is clear and moist.  Eyes: Pupils are equal, round, and reactive to light.  Neck: Neck supple.  Cardiovascular: Normal rate, regular rhythm  and normal heart sounds.   No murmur heard. Pulmonary/Chest: Effort normal and breath sounds normal. He has no wheezes.  Abdominal: Bowel sounds are normal. There is no tenderness.  Genitourinary: Rectum normal and prostate normal.  Musculoskeletal: He exhibits no edema.  Neurological: He is alert and oriented to person, place, and time.  Psychiatric: Mood, memory, affect and judgment normal.  Nursing note and vitals reviewed.     Assessment & Plan  1. Annual physical exam  - CBC with Differential/Platelet - COMPLETE METABOLIC PANEL WITH GFR - Lipid panel - TSH - VITAMIN D 25 Hydroxy (Vit-D Deficiency, Fractures)  2. Screening for prostate cancer  - PSA  3. Prediabetes Point-of-care A1c 6.3%, consistent with  prediabetes - POCT glycosylated hemoglobin (Hb A1C)   [name] [facility] 07/03/2016 2:45 PM

## 2016-07-04 LAB — PSA: PSA: 1.1 ng/mL (ref <=4.0)

## 2016-07-04 LAB — VITAMIN D 25 HYDROXY (VIT D DEFICIENCY, FRACTURES): VIT D 25 HYDROXY: 25 ng/mL — AB (ref 30–100)

## 2016-07-06 ENCOUNTER — Telehealth: Payer: Self-pay

## 2016-07-06 MED ORDER — VITAMIN D (ERGOCALCIFEROL) 1.25 MG (50000 UNIT) PO CAPS: 50000.0000 [IU] | ORAL_CAPSULE | ORAL | 0 refills | Status: DC

## 2016-07-06 NOTE — Telephone Encounter (Signed)
Patient has been notified of lab results and a prescription for vitamin D3 50,000 units take 1 capsule once a week x12 weeks has been sent to [facility] [address] per Dr. Manuella Ghazi, patient has been notified

## 2016-07-12 ENCOUNTER — Telehealth: Payer: Self-pay | Admitting: Family Medicine

## 2016-07-12 DIAGNOSIS — N529 Male erectile dysfunction, unspecified: Secondary | ICD-10-CM

## 2016-07-12 MED ORDER — TADALAFIL 20 MG PO TABS: 20.0000 mg | ORAL_TABLET | Freq: Every day | ORAL | 2 refills | Status: DC | PRN

## 2016-07-12 NOTE — Telephone Encounter (Signed)
Pt requesting refill on Cialis to be sent to [address]

## 2016-07-12 NOTE — Telephone Encounter (Signed)
Prescription for Cialis has been sent to patient's pharmacy

## 2016-07-13 ENCOUNTER — Encounter (INDEPENDENT_AMBULATORY_CARE_PROVIDER_SITE_OTHER): Payer: Self-pay

## 2016-07-16 ENCOUNTER — Other Ambulatory Visit: Payer: Self-pay | Admitting: Family Medicine

## 2016-07-16 DIAGNOSIS — I10 Essential (primary) hypertension: Secondary | ICD-10-CM

## 2016-08-22 ENCOUNTER — Encounter: Payer: Self-pay | Admitting: Family Medicine

## 2016-08-22 ENCOUNTER — Ambulatory Visit (INDEPENDENT_AMBULATORY_CARE_PROVIDER_SITE_OTHER): Payer: Medicare Other | Admitting: Family Medicine

## 2016-08-22 VITALS — BP 124/79 | HR 73 | Temp 98.4°F | Resp 16 | Ht 72.0 in | Wt 240.4 lb

## 2016-08-22 DIAGNOSIS — J40 Bronchitis, not specified as acute or chronic: Secondary | ICD-10-CM

## 2016-08-22 DIAGNOSIS — R05 Cough: Secondary | ICD-10-CM | POA: Diagnosis not present

## 2016-08-22 DIAGNOSIS — R059 Cough, unspecified: Secondary | ICD-10-CM

## 2016-08-22 MED ORDER — PREDNISONE 10 MG PO TABS: 10.0000 mg | ORAL_TABLET | Freq: Every day | ORAL | 0 refills | Status: DC

## 2016-08-22 MED ORDER — BENZONATATE 100 MG PO CAPS: 100.0000 mg | ORAL_CAPSULE | Freq: Three times a day (TID) | ORAL | 0 refills | Status: DC | PRN

## 2016-08-22 NOTE — Patient Instructions (Addendum)
Continue over the counter Mucinex Drink plenty of fluids Use cool mist humidifier at night

## 2016-08-22 NOTE — Progress Notes (Addendum)
Name: Blake Rivera   MRN: [id number]    DOB: Jun 07, 1934   Date:08/22/2016       Progress Note  Subjective  Chief Complaint  Chief Complaint  Patient presents with  . Cough    x1 week  . Nasal Congestion    HPI  PT presents with 1 week history of cough, chest and nasal congestion.  Had company over about a week ago that had a similar illness. Endorses mild shortness of breath when coughing and productive cough. No chest pain, fevers or chills, fatigue, body aches. Has tried mucinex and theraflu without relief.  Patient Active Problem List   Diagnosis Date Noted  . Productive cough 02/07/2016  . Dyslipidemia 11/25/2014  . Rising PSA level 11/25/2014  . Annual physical exam 08/26/2014  . Abnormal ECG 07/16/2014  . History of colitis 07/16/2014  . ED (erectile dysfunction) 07/16/2014  . Benign hypertension 07/16/2014  . Anterior knee pain 07/16/2014  . Arthralgia of shoulder 07/16/2014  . Disturbance of skin sensation 07/16/2014  . Avitaminosis D 07/16/2014  . Dyspnea 12/29/2013  . HTN (hypertension) 12/29/2013    Social History  Substance Use Topics  . Smoking status: Former Smoker    Years: 25.00    Quit date: 05/18/2014  . Smokeless tobacco: Never Used  . Alcohol use 0.0 oz/week     Comment: moderate     Current Outpatient Prescriptions:  .  amLODipine (NORVASC) 10 MG tablet, TAKE 1 TABLET (10 MG TOTAL) BY MOUTH DAILY., Disp: 90 tablet, Rfl: 1 .  aspirin 81 MG tablet, Take 81 mg by mouth daily., Disp: , Rfl:  .  Multiple Vitamins-Minerals (CENTRUM SILVER PO), Take by mouth daily., Disp: , Rfl:  .  tadalafil (CIALIS) 20 MG tablet, Take 1 tablet (20 mg total) by mouth daily as needed for erectile dysfunction., Disp: 10 tablet, Rfl: 2 .  Vitamin D, Ergocalciferol, (DRISDOL) 50000 units CAPS capsule, Take 1 capsule (50,000 Units total) by mouth once a week. For 12 weeks, Disp: 12 capsule, Rfl: 0  Allergies  Allergen Reactions  . Lisinopril     Angioedema  . Omacor   [Omega-3-Acid Ethyl Esters]     Other reaction(s): Rash  . Zocor  [Simvastatin]     Muscle Pain    ROS  Ten systems reviewed and is negative except as mentioned in HPI  Objective  Vitals:   08/22/16 0931  BP: 124/79  Pulse: 73  Resp: 16  Temp: 98.4 F (36.9 C)  TempSrc: Oral  SpO2: 94%  Weight: 240 lb 6.4 oz (109 kg)  Height: 6' (1.829 m)   Body mass index is 32.6 kg/m.  Nursing Note and Vital Signs reviewed.  Physical Exam  Constitutional: Patient appears well-developed and well-nourished. Obese No distress.  HEENT: head atraumatic, normocephalic, pupils equal and reactive to light, EOM's intact, TM's without erythema or bulging, no maxillary or frontal sinus pain on palpation, nares boggy; neck supple without lymphadenopathy, oropharynx mildly erythematous and moist without exudate Cardiovascular: Normal rate, regular rhythm, S1/S2 present.  No murmur or rub heard. No BLE edema. Pulmonary/Chest: Effort normal and breath sounds clear - no rhonchi, wheezes, or crackles. No respiratory distress or retractions. Psychiatric: Patient has a normal mood and affect. behavior is normal. Judgment and thought content normal.  Recent Results (from the past 2160 hour(s))  CBC with Differential/Platelet     Status: None   Collection Time: 07/03/16  3:22 PM  Result Value Ref Range   WBC 5.0 3.8 -  10.8 K/uL   RBC 5.19 4.20 - 5.80 MIL/uL   Hemoglobin 14.9 13.2 - 17.1 g/dL   HCT 46.1 38.5 - 50.0 %   MCV 88.8 80.0 - 100.0 fL   MCH 28.7 27.0 - 33.0 pg   MCHC 32.3 32.0 - 36.0 g/dL   RDW 14.3 11.0 - 15.0 %   Platelets 190 140 - 400 K/uL   MPV 8.5 7.5 - 12.5 fL   Neutro Abs 2,050 1,500 - 7,800 cells/uL   Lymphs Abs 2,500 850 - 3,900 cells/uL   Monocytes Absolute 400 200 - 950 cells/uL   Eosinophils Absolute 50 15 - 500 cells/uL   Basophils Absolute 0 0 - 200 cells/uL   Neutrophils Relative % 41 %   Lymphocytes Relative 50 %   Monocytes Relative 8 %   Eosinophils Relative 1 %    Basophils Relative 0 %   Smear Review Criteria for review not met   COMPLETE METABOLIC PANEL WITH GFR     Status: Abnormal   Collection Time: 07/03/16  3:22 PM  Result Value Ref Range   Sodium 141 135 - 146 mmol/L   Potassium 4.4 3.5 - 5.3 mmol/L   Chloride 104 98 - 110 mmol/L   CO2 30 20 - 31 mmol/L   Glucose, Bld 92 65 - 99 mg/dL   BUN 10 7 - 25 mg/dL   Creat 1.14 (H) 0.70 - 1.11 mg/dL    Comment:   For patients > or = 80 years of age: The upper reference limit for Creatinine is approximately 13% higher for people identified as African-American.      Total Bilirubin 0.6 0.2 - 1.2 mg/dL   Alkaline Phosphatase 79 40 - 115 U/L   AST 21 10 - 35 U/L   ALT 16 9 - 46 U/L   Total Protein 7.2 6.1 - 8.1 g/dL   Albumin 4.2 3.6 - 5.1 g/dL   Calcium 9.0 8.6 - 10.3 mg/dL   GFR, Est African American 70 >=60 mL/min   GFR, Est Non African American 60 >=60 mL/min  Lipid panel     Status: Abnormal   Collection Time: 07/03/16  3:22 PM  Result Value Ref Range   Cholesterol 181 <200 mg/dL   Triglycerides 122 <150 mg/dL   HDL 45 >40 mg/dL   Total CHOL/HDL Ratio 4.0 <5.0 Ratio   VLDL 24 <30 mg/dL   LDL Cholesterol 112 (H) <100 mg/dL  TSH     Status: None   Collection Time: 07/03/16  3:22 PM  Result Value Ref Range   TSH 2.04 0.40 - 4.50 mIU/L  VITAMIN D 25 Hydroxy (Vit-D Deficiency, Fractures)     Status: Abnormal   Collection Time: 07/03/16  3:22 PM  Result Value Ref Range   Vit D, 25-Hydroxy 25 (L) 30 - 100 ng/mL    Comment: Vitamin D Status           25-OH Vitamin D        Deficiency                <20 ng/mL        Insufficiency         20 - 29 ng/mL        Optimal             > or = 30 ng/mL   For 25-OH Vitamin D testing on patients on D2-supplementation and patients for whom quantitation of D2 and D3 fractions is required, the QuestAssureD 25-OH  VIT D, (D2,D3), LC/MS/MS is recommended: order code [phone] (patients > 2 yrs).   PSA     Status: None   Collection Time: 07/03/16  3:22  PM  Result Value Ref Range   PSA 1.1 <=4.0 ng/mL    Comment:   The total PSA value from this assay system is standardized against the WHO standard. The test result will be approximately 20% lower when compared to the equimolar-standardized total PSA (Beckman Coulter). Comparison of serial PSA results should be interpreted with this fact in mind.   This test was performed using the Siemens chemiluminescent method. Values obtained from different assay methods cannot be used interchangeably. PSA levels, regardless of value, should not be interpreted as absolute evidence of the presence or absence of disease.     POCT glycosylated hemoglobin (Hb A1C)     Status: Abnormal   Collection Time: 07/03/16  3:44 PM  Result Value Ref Range   Hemoglobin A1C 6.3      Assessment & Plan  1. Bronchitis - benzonatate (TESSALON PERLES) 100 MG capsule; Take 1 capsule (100 mg total) by mouth 3 (three) times daily as needed for cough.  Dispense: 20 capsule; Refill: 0 - predniSONE (DELTASONE) 10 MG tablet; Take 1 tablet (10 mg total) by mouth daily with breakfast. Day1:6pills, Day2:5pills, Day3:4pills, Day4:3pills, Day5:2pills, Day6:1pill  Dispense: 21 tablet; Refill: 0 - Declines inhaler - states does not like to use them.  2. Cough - benzonatate (TESSALON PERLES) 100 MG capsule; Take 1 capsule (100 mg total) by mouth 3 (three) times daily as needed for cough.  Dispense: 20 capsule; Refill: 0 - predniSONE (DELTASONE) 10 MG tablet; Take 1 tablet (10 mg total) by mouth daily with breakfast. Day1:6pills, Day2:5pills, Day3:4pills, Day4:3pills, Day5:2pills, Day6:1pill  Dispense: 21 tablet; Refill: 0  - Return if symptoms worsen or fail to improve, for 3-4 days.  -Red flags and when to present for emergency care or RTC including fever >101.15F, chest pain, shortness of breath or wheezing, new/worsening/un-resolving symptoms, reviewed with patient at time of visit. Follow up and care instructions discussed and  provided in AVS.  I have reviewed this encounter including the documentation in this note and/or discussed this patient with the Johney Maine, FNP, NP-C. I am certifying that I agree with the content of this note as supervising physician.  Steele Sizer, MD [facility] 08/30/2016, 1:06 PM

## 2016-09-29 ENCOUNTER — Other Ambulatory Visit: Payer: Self-pay | Admitting: Family Medicine

## 2016-10-05 ENCOUNTER — Other Ambulatory Visit: Payer: Self-pay | Admitting: Family Medicine

## 2016-10-14 ENCOUNTER — Other Ambulatory Visit: Payer: Self-pay | Admitting: Family Medicine

## 2016-10-17 ENCOUNTER — Other Ambulatory Visit: Payer: Self-pay

## 2016-10-17 DIAGNOSIS — I10 Essential (primary) hypertension: Secondary | ICD-10-CM

## 2016-10-17 NOTE — Telephone Encounter (Signed)
Refill request was sent to Dr. Syed Asad A. Shah for approval and submission.  

## 2016-10-21 ENCOUNTER — Other Ambulatory Visit: Payer: Self-pay | Admitting: Family Medicine

## 2016-10-23 ENCOUNTER — Telehealth: Payer: Self-pay | Admitting: Family Medicine

## 2016-10-23 DIAGNOSIS — I10 Essential (primary) hypertension: Secondary | ICD-10-CM

## 2016-10-23 NOTE — Telephone Encounter (Signed)
Blake Rivera from [facility] requesting verbal to dispense amlodipine 10 mg.  (P) [id number] Refernce #: [id number]

## 2016-10-24 MED ORDER — AMLODIPINE BESYLATE 10 MG PO TABS: 10.0000 mg | ORAL_TABLET | Freq: Every day | ORAL | 0 refills | Status: DC

## 2016-10-24 NOTE — Telephone Encounter (Signed)
Pt last seen by you 07/03/16 for PE, please advise on refills

## 2016-10-24 NOTE — Telephone Encounter (Signed)
Please refill for 30 days and schedule appointment for blood pressure follow-up. Thank you

## 2016-10-24 NOTE — Telephone Encounter (Signed)
RX sent, per Dr.Shah please have the patient scheduled for a BP follow up, thanks!

## 2016-10-24 NOTE — Telephone Encounter (Signed)
Left message with wife Stanton Kidney) to call the office to schedule an appointment.

## 2016-12-22 ENCOUNTER — Ambulatory Visit: Payer: Medicare Other

## 2016-12-22 ENCOUNTER — Ambulatory Visit (INDEPENDENT_AMBULATORY_CARE_PROVIDER_SITE_OTHER): Payer: Medicare Other

## 2016-12-22 VITALS — BP 124/70 | HR 63 | Temp 97.4°F | Resp 16 | Ht 72.0 in | Wt 248.5 lb

## 2016-12-22 DIAGNOSIS — Z0000 Encounter for general adult medical examination without abnormal findings: Secondary | ICD-10-CM | POA: Diagnosis not present

## 2016-12-22 NOTE — Patient Instructions (Signed)
Blake Rivera , Thank you for taking time to come for your Medicare Wellness Visit. I appreciate your ongoing commitment to your health goals. Please review the following plan we discussed and let me know if I can assist you in the future.   Screening recommendations/referrals: Colonoscopy: Colon cancer screenings no longer required Recommended yearly ophthalmology/optometry visit for glaucoma screening and checkup Recommended yearly dental visit for hygiene and checkup  Vaccinations: Influenza vaccine: Declined. Please provide a copy of your vaccination receord Pneumococcal vaccine: Declined. Declined. Please call your insurance company to determine your out of pocket expense. Tdap vaccine: Declined. Please call your insurance company to determine your out of pocket expense. Shingles vaccine: Declined. Please call your insurance company to determine your out of pocket expense.  Advanced directives: Please bring a copy of your POA (Power of Attorney) and/or Living Will to your next appointment.   Conditions/risks identified: Fall risk prevention discussed  Next appointment: Please schedule an appointment with Dr. Manuella Ghazi for January 2019 to complete your physical exam.   Please schedule your annual wellness exam with your Nurse Health Advisor in one year.  Preventive Care 7 Years and Older, Male Preventive care refers to lifestyle choices and visits with your health care provider that can promote health and wellness. What does preventive care include?  A yearly physical exam. This is also called an annual well check.  Dental exams once or twice a year.  Routine eye exams. Ask your health care provider how often you should have your eyes checked.  Personal lifestyle choices, including:  Daily care of your teeth and gums.  Regular physical activity.  Eating a healthy diet.  Avoiding tobacco and drug use.  Limiting alcohol use.  Practicing safe sex.  Taking low doses of aspirin  every day.  Taking vitamin and mineral supplements as recommended by your health care provider. What happens during an annual well check? The services and screenings done by your health care provider during your annual well check will depend on your age, overall health, lifestyle risk factors, and family history of disease. Counseling  Your health care provider may ask you questions about your:  Alcohol use.  Tobacco use.  Drug use.  Emotional well-being.  Home and relationship well-being.  Sexual activity.  Eating habits.  History of falls.  Memory and ability to understand (cognition).  Work and work Statistician. Screening  You may have the following tests or measurements:  Height, weight, and BMI.  Blood pressure.  Lipid and cholesterol levels. These may be checked every 5 years, or more frequently if you are over 64 years old.  Skin check.  Lung cancer screening. You may have this screening every year starting at age 36 if you have a 30-pack-year history of smoking and currently smoke or have quit within the past 15 years.  Fecal occult blood test (FOBT) of the stool. You may have this test every year starting at age 73.  Flexible sigmoidoscopy or colonoscopy. You may have a sigmoidoscopy every 5 years or a colonoscopy every 10 years starting at age 100.  Prostate cancer screening. Recommendations will vary depending on your family history and other risks.  Hepatitis C blood test.  Hepatitis B blood test.  Sexually transmitted disease (STD) testing.  Diabetes screening. This is done by checking your blood sugar (glucose) after you have not eaten for a while (fasting). You may have this done every 1-3 years.  Abdominal aortic aneurysm (AAA) screening. You may need this if you  are a current or former smoker.  Osteoporosis. You may be screened starting at age 102 if you are at high risk. Talk with your health care provider about your test results, treatment  options, and if necessary, the need for more tests. Vaccines  Your health care provider may recommend certain vaccines, such as:  Influenza vaccine. This is recommended every year.  Tetanus, diphtheria, and acellular pertussis (Tdap, Td) vaccine. You may need a Td booster every 10 years.  Zoster vaccine. You may need this after age 47.  Pneumococcal 13-valent conjugate (PCV13) vaccine. One dose is recommended after age 20.  Pneumococcal polysaccharide (PPSV23) vaccine. One dose is recommended after age 21. Talk to your health care provider about which screenings and vaccines you need and how often you need them. This information is not intended to replace advice given to you by your health care provider. Make sure you discuss any questions you have with your health care provider. Document Released: 01/29/2015 Document Revised: 09/22/2015 Document Reviewed: 11/03/2014 Elsevier Interactive Patient Education  2017 [place] Prevention in the Home Falls can cause injuries. They can happen to people of all ages. There are many things you can do to make your home safe and to help prevent falls. What can I do on the outside of my home?  Regularly fix the edges of walkways and driveways and fix any cracks.  Remove anything that might make you trip as you walk through a door, such as a raised step or threshold.  Trim any bushes or trees on the path to your home.  Use bright outdoor lighting.  Clear any walking paths of anything that might make someone trip, such as rocks or tools.  Regularly check to see if handrails are loose or broken. Make sure that both sides of any steps have handrails.  Any raised decks and porches should have guardrails on the edges.  Have any leaves, snow, or ice cleared regularly.  Use sand or salt on walking paths during winter.  Clean up any spills in your garage right away. This includes oil or grease spills. What can I do in the  bathroom?  Use night lights.  Install grab bars by the toilet and in the tub and shower. Do not use towel bars as grab bars.  Use non-skid mats or decals in the tub or shower.  If you need to sit down in the shower, use a plastic, non-slip stool.  Keep the floor dry. Clean up any water that spills on the floor as soon as it happens.  Remove soap buildup in the tub or shower regularly.  Attach bath mats securely with double-sided non-slip rug tape.  Do not have throw rugs and other things on the floor that can make you trip. What can I do in the bedroom?  Use night lights.  Make sure that you have a light by your bed that is easy to reach.  Do not use any sheets or blankets that are too big for your bed. They should not hang down onto the floor.  Have a firm chair that has side arms. You can use this for support while you get dressed.  Do not have throw rugs and other things on the floor that can make you trip. What can I do in the kitchen?  Clean up any spills right away.  Avoid walking on wet floors.  Keep items that you use a lot in easy-to-reach places.  If you need to reach  something above you, use a strong step stool that has a grab bar.  Keep electrical cords out of the way.  Do not use floor polish or wax that makes floors slippery. If you must use wax, use non-skid floor wax.  Do not have throw rugs and other things on the floor that can make you trip. What can I do with my stairs?  Do not leave any items on the stairs.  Make sure that there are handrails on both sides of the stairs and use them. Fix handrails that are broken or loose. Make sure that handrails are as long as the stairways.  Check any carpeting to make sure that it is firmly attached to the stairs. Fix any carpet that is loose or worn.  Avoid having throw rugs at the top or bottom of the stairs. If you do have throw rugs, attach them to the floor with carpet tape.  Make sure that you have a  light switch at the top of the stairs and the bottom of the stairs. If you do not have them, ask someone to add them for you. What else can I do to help prevent falls?  Wear shoes that:  Do not have high heels.  Have rubber bottoms.  Are comfortable and fit you well.  Are closed at the toe. Do not wear sandals.  If you use a stepladder:  Make sure that it is fully opened. Do not climb a closed stepladder.  Make sure that both sides of the stepladder are locked into place.  Ask someone to hold it for you, if possible.  Clearly mark and make sure that you can see:  Any grab bars or handrails.  First and last steps.  Where the edge of each step is.  Use tools that help you move around (mobility aids) if they are needed. These include:  Canes.  Walkers.  Scooters.  Crutches.  Turn on the lights when you go into a dark area. Replace any light bulbs as soon as they burn out.  Set up your furniture so you have a clear path. Avoid moving your furniture around.  If any of your floors are uneven, fix them.  If there are any pets around you, be aware of where they are.  Review your medicines with your doctor. Some medicines can make you feel dizzy. This can increase your chance of falling. Ask your doctor what other things that you can do to help prevent falls. This information is not intended to replace advice given to you by your health care provider. Make sure you discuss any questions you have with your health care provider. Document Released: 10/29/2008 Document Revised: 06/10/2015 Document Reviewed: 02/06/2014 Elsevier Interactive Patient Education  2017 [place] provider would like to you have your annual eye exam. Please contact your current eye doctor or here are some good options for you to contact.   [facility] Address: [address] [place], [place] [address]   Address: [address] [place], [place] [address]  Phone:  [phone]      Phone: [phone]  Website: [email or web address]    Website: [email or web address]     [facility]  Address: [address] [place], [place], [place] [address]   Address: [place], [place], [place] [address] Phone: [phone]      Phone: [phone]  [facility] Address: [place], [place], [place] [address]  Phone: [phone]

## 2016-12-22 NOTE — Progress Notes (Signed)
Subjective:   Blake Rivera is a 82 y.o. male who presents for Medicare Annual/Subsequent preventive examination.  Review of Systems:  N/A Cardiac Risk Factors include: advanced age (>68men, >48 women);obesity (BMI >30kg/m2);male gender;hypertension;dyslipidemia     Objective:    Vitals: BP 124/70 (BP Location: Left Arm, Patient Position: Sitting, Cuff Size: Large)   Pulse 63   Temp (!) 97.4 F (36.3 C) (Oral)   Resp 16   Ht 6' (1.829 m)   Wt 248 lb 8 oz (112.7 kg)   BMI 33.70 kg/m   Body mass index is 33.7 kg/m.  Advanced Directives 12/22/2016 08/22/2016 07/03/2016 02/07/2016 02/25/2015 11/25/2014 08/26/2014  Does Patient Have a Medical Advance Directive? Yes No No No No Yes Yes  Type of Paramedic of [place];Living will - - - - Living will Living will  Does patient want to make changes to medical advance directive? - - - - - - No - Patient declined  Copy of [place] in Chart? No - copy requested - - - - No - copy requested No - copy requested  Would patient like information on creating a medical advance directive? - - - - No - patient declined information No - patient declined information -    Tobacco Social History   Tobacco Use  Smoking Status Former Smoker  . Packs/day: 0.25  . Years: 25.00  . Pack years: 6.25  . Types: Cigarettes  . Last attempt to quit: 05/18/2014  . Years since quitting: 2.6  Smokeless Tobacco Never Used  Tobacco Comment   Former smoker - no need to provide with smoking cessation materials     Counseling given: N/A Comment: Former smoker - no need to provide with smoking cessation materials   Clinical Intake:  Pre-visit preparation completed: Yes  Pain : No/denies pain     BMI - recorded: 33.7 Nutritional Status: BMI > 30  Obese Nutritional Risks: Within the last 2 months, pt has denied all of the following: Unintentional weight loss or weight gain, non-healing wounds and N/V/D Diabetes:  No  Activities of Daily Living: Independent Ambulation: Independent with device- listed below Home Assistive Devices/Equipment: Eyeglasses, Other (Comment)(grab bars in shower) Medication Administration: Independent Home Management: Independent  Barriers to Care Management & Learning: None  Do you feel unsafe in your current relationship?: No(married) Do you feel physically threatened by others?: No Anyone hurting you at home, work, or school?: No  How often do you need to have someone help you when you read instructions, pamphlets, or other written materials from your doctor or pharmacy?: 1 - Never What is the last grade level you completed in school?: Phd in education  Interpreter Needed?: No  Information entered by :: Idell Pickles, LPN  Past Medical History:  Diagnosis Date  . Diverticulitis of colon   . ED (erectile dysfunction)   . Hyperglycemia   . Hypertension   . Vitamin D deficiency    Past Surgical History:  Procedure Laterality Date  . HERNIA REPAIR     Family History  Problem Relation Age of Onset  . Heart disease Mother   . Aneurysm Mother   . Heart disease Brother   . Emphysema Father   . Healthy Sister   . Healthy Brother   . Healthy Brother   . Heart disease Sister    Social History   Socioeconomic History  . Marital status: Married    Spouse name: None  . Number of children: 2  .  Years of education: None  . Highest education level: None  Social Needs  . Financial resource strain: Not hard at all  . Food insecurity - worry: Never true  . Food insecurity - inability: Never true  . Transportation needs - medical: No  . Transportation needs - non-medical: No  Occupational History  . Occupation: Retired  Tobacco Use  . Smoking status: Former Smoker    Packs/day: 0.25    Years: 25.00    Pack years: 6.25    Types: Cigarettes    Last attempt to quit: 05/18/2014    Years since quitting: 2.6  . Smokeless tobacco: Never Used  . Tobacco comment:  Former smoker - no need to provide with smoking cessation materials  Substance and Sexual Activity  . Alcohol use: Yes    Alcohol/week: 0.6 oz    Types: 1 Shots of liquor per week    Comment: moderate - 1 whiskey shot per day  . Drug use: No  . Sexual activity: No  Other Topics Concern  . None  Social History Narrative  . None    Outpatient Encounter Medications as of 12/22/2016  Medication Sig  . amLODipine (NORVASC) 10 MG tablet Take 1 tablet (10 mg total) by mouth daily.  Marland Kitchen aspirin 81 MG tablet Take 81 mg by mouth daily.  . Multiple Vitamins-Minerals (CENTRUM SILVER PO) Take by mouth daily.  . tadalafil (CIALIS) 20 MG tablet Take 1 tablet (20 mg total) by mouth daily as needed for erectile dysfunction.  . benzonatate (TESSALON PERLES) 100 MG capsule Take 1 capsule (100 mg total) by mouth 3 (three) times daily as needed for cough. (Patient not taking: Reported on 12/22/2016)  . predniSONE (DELTASONE) 10 MG tablet Take 1 tablet (10 mg total) by mouth daily with breakfast. Day1:6pills, Day2:5pills, Day3:4pills, Day4:3pills, Day5:2pills, Day6:1pill (Patient not taking: Reported on 12/22/2016)  . Vitamin D, Ergocalciferol, (DRISDOL) 50000 units CAPS capsule Take 1 capsule (50,000 Units total) by mouth once a week. For 12 weeks (Patient not taking: Reported on 12/22/2016)   No facility-administered encounter medications on file as of 12/22/2016.     Activities of Daily Living In your present state of health, do you have any difficulty performing the following activities: 12/22/2016 08/22/2016  Hearing? N N  Vision? N Y  Comment - reading glasses  Difficulty concentrating or making decisions? N N  Walking or climbing stairs? Y N  Comment exhaustion -  Dressing or bathing? N N  Doing errands, shopping? N N  Preparing Food and eating ? N -  Using the Toilet? N -  In the past six months, have you accidently leaked urine? N -  Do you have problems with loss of bowel control? N -  Managing  your Medications? N -  Managing your Finances? N -  Housekeeping or managing your Housekeeping? N -  Some recent data might be hidden    Timed Get Up and Go Performed: Yes. 9 sec. No intervention required  Patient Care Team: Roselee Nova, MD as PCP - General (Family Medicine)   Assessment:   Exercise Activities and Dietary recommendations Current Exercise Habits: The patient does not participate in regular exercise at present, Exercise limited by: None identified  Goals    None     Fall Risk Fall Risk  12/22/2016 08/22/2016 07/03/2016 02/07/2016 02/25/2015  Falls in the past year? No No No No No  Number falls in past yr: - - - - -  Injury with Fall? - - - - -  Risk for fall due to : - - - - -  Risk for fall due to: Comment - - - - -  Follow up - - - - -   Is the patient's home free of loose throw rugs in walkways, pet beds, electrical cords, etc?   yes      Grab bars in the bathroom? yes      Handrails on the stairs?   yes      Adequate lighting?   yes Depression Screen PHQ 2/9 Scores 12/22/2016 08/22/2016 07/03/2016 02/07/2016  PHQ - 2 Score 0 0 0 0    Cognitive Function     6CIT Screen 12/22/2016  What Year? 0 points  What month? 0 points  What time? 0 points  Count back from 20 0 points  Months in reverse 0 points  Repeat phrase 0 points  Total Score 0    Immunization History  Administered Date(s) Administered  . Influenza Split 12/02/2013  . Influenza, Seasonal, Injecte, Preservative Fre 11/11/2012  . Influenza-Unspecified 12/06/2014, 10/25/2015   Due for flu vaccine. Pt declined today stating he has received this vaccine at his local pharmacy. Requested pt provide a copy of his immunization record.   Also due for Pneumococcal and Tdap vaccines. Declined my offer to administer Tdap and PCV13 vaccine today. Pt has been advised to call his insurance company to determine his out of pocket expense. Advised he may also receive these vaccines at his local pharmacy or  Health Dept.  Due for Shingles vaccine. Declined today. States he has received Zostavax in the past. Asked pt to provide a copy of his immunization record. Education provided about the CDC's recommendations for Shingrix. Pt has been advised to call his insurance company to determine his out of pocket expense. Advised he may also receive this vaccine at his local pharmacy or Health Dept.  Screening Tests Health Maintenance  Topic Date Due  . INFLUENZA VACCINE  08/16/2016  . TETANUS/TDAP  12/22/2017 (Originally 01/31/1953)  . PNA vac Low Risk Adult (1 of 2 - PCV13) 12/22/2017 (Originally 01/30/2001)   Cancer Screenings: Lung: Low Dose CT Chest recommended if Age 77-80 years, 30 pack-year currently smoking OR have quit w/in 15years. Patient does not qualify. Colorectal: Colon cancer screenings no longer required  Additional Screenings: HIV Screening: does not qualify Hepatitis C Screening: does not qualify    Plan:   I have personally reviewed and addressed the Medicare Annual Wellness questionnaire and have noted the following in the patient's chart:  A. Medical and social history B. Use of alcohol, tobacco or illicit drugs  C. Current medications and supplements D. Functional ability and status E.  Nutritional status F.  Physical activity G. Advance directives and Code Status: Pt provided with MOST and DNR documents for his/her review. Pt has been advised that he/she will only need to complete the document that is most appropriate to suit his/her health care wishes. Once completed, pt has been advised to return the appropriate document to our office for the physician to review and sign. H. List of other physicians I.  Hospitalizations, surgeries, and ER visits in previous 12 months J.  [place] such as hearing and vision if needed, cognitive and depression L. Referrals and appointments - none  In addition, I have reviewed and discussed with patient certain preventive  protocols, quality metrics, and best practice recommendations. A written personalized care plan for preventive services as well as general preventive health recommendations were provided to patient.  See attached scanned questionnaire for additional information.   Signed,  Aleatha Borer, LPN Nurse Health Advisor

## 2017-02-12 ENCOUNTER — Encounter: Payer: Self-pay | Admitting: Family Medicine

## 2017-02-12 ENCOUNTER — Ambulatory Visit (INDEPENDENT_AMBULATORY_CARE_PROVIDER_SITE_OTHER): Payer: Medicare Other | Admitting: Family Medicine

## 2017-02-12 VITALS — BP 142/78 | HR 89 | Temp 100.4°F | Resp 16 | Ht 72.0 in | Wt 243.4 lb

## 2017-02-12 DIAGNOSIS — J111 Influenza due to unidentified influenza virus with other respiratory manifestations: Secondary | ICD-10-CM | POA: Diagnosis not present

## 2017-02-12 DIAGNOSIS — I10 Essential (primary) hypertension: Secondary | ICD-10-CM

## 2017-02-12 DIAGNOSIS — B9789 Other viral agents as the cause of diseases classified elsewhere: Secondary | ICD-10-CM

## 2017-02-12 DIAGNOSIS — J069 Acute upper respiratory infection, unspecified: Secondary | ICD-10-CM

## 2017-02-12 LAB — POCT INFLUENZA A/B
INFLUENZA A, POC: POSITIVE — AB
Influenza B, POC: NEGATIVE

## 2017-02-12 MED ORDER — OSELTAMIVIR PHOSPHATE 75 MG PO CAPS: 75.0000 mg | ORAL_CAPSULE | Freq: Two times a day (BID) | ORAL | 0 refills | Status: AC

## 2017-02-12 MED ORDER — AMLODIPINE BESYLATE 10 MG PO TABS: 10.0000 mg | ORAL_TABLET | Freq: Every day | ORAL | 0 refills | Status: DC

## 2017-02-12 MED ORDER — GUAIFENESIN-CODEINE 100-10 MG/5ML PO SYRP: 10.0000 mL | ORAL_SOLUTION | Freq: Three times a day (TID) | ORAL | 0 refills | Status: AC | PRN

## 2017-02-12 NOTE — Progress Notes (Signed)
Name: Blake Rivera   MRN: [id number]    DOB: [birth year]   Date:02/12/2017       Progress Note  Subjective  Chief Complaint  Chief Complaint  Patient presents with  . Cough    Onset-02/11/2017 Dry cough. Pt denies any other symtoms.   . Hypertension    Pt deniesany issues  . Medication Refill    Cough  This is a new problem. The current episode started yesterday. The cough is non-productive. Associated symptoms include shortness of breath (from the coughing.). Pertinent negatives include no chest pain, chills, fever (felt warm yesterday, today's temp is 100.61F), headaches, hemoptysis, sore throat or sweats. He has tried OTC cough suppressant (took Theraflu last night) for the symptoms. There is no history of bronchitis, COPD or pneumonia.  Hypertension  This is a chronic problem. The problem is unchanged. The problem is controlled. Associated symptoms include shortness of breath (from the coughing.). Pertinent negatives include no anxiety, blurred vision, chest pain, headaches, palpitations or sweats. Past treatments include calcium channel blockers. There is no history of kidney disease, CAD/MI or CVA.     Past Medical History:  Diagnosis Date  . Diverticulitis of colon   . ED (erectile dysfunction)   . Hyperglycemia   . Hypertension   . Vitamin D deficiency     Past Surgical History:  Procedure Laterality Date  . HERNIA REPAIR      Family History  Problem Relation Age of Onset  . Heart disease Mother   . Aneurysm Mother   . Heart disease Brother   . Emphysema Father   . Healthy Sister   . Healthy Brother   . Healthy Brother   . Heart disease Sister     Social History   Socioeconomic History  . Marital status: Married    Spouse name: Not on file  . Number of children: 2  . Years of education: Not on file  . Highest education level: Not on file  Social Needs  . Financial resource strain: Not hard at all  . Food insecurity - worry: Never true  . Food  insecurity - inability: Never true  . Transportation needs - medical: No  . Transportation needs - non-medical: No  Occupational History  . Occupation: Retired  Tobacco Use  . Smoking status: Former Smoker    Packs/day: 0.25    Years: 25.00    Pack years: 6.25    Types: Cigarettes    Last attempt to quit: 05/18/2014    Years since quitting: 2.7  . Smokeless tobacco: Never Used  . Tobacco comment: Former smoker - no need to provide with smoking cessation materials  Substance and Sexual Activity  . Alcohol use: Yes    Alcohol/week: 0.6 oz    Types: 1 Shots of liquor per week    Comment: moderate - 1 whiskey shot per day  . Drug use: No  . Sexual activity: No  Other Topics Concern  . Not on file  Social History Narrative  . Not on file     Current Outpatient Medications:  .  amLODipine (NORVASC) 10 MG tablet, Take 1 tablet (10 mg total) by mouth daily., Disp: 30 tablet, Rfl: 0 .  aspirin 81 MG tablet, Take 81 mg by mouth daily., Disp: , Rfl:  .  Multiple Vitamins-Minerals (CENTRUM SILVER PO), Take by mouth daily., Disp: , Rfl:  .  benzonatate (TESSALON PERLES) 100 MG capsule, Take 1 capsule (100 mg total) by mouth 3 (three) times daily  as needed for cough. (Patient not taking: Reported on 02/12/2017), Disp: 20 capsule, Rfl: 0 .  predniSONE (DELTASONE) 10 MG tablet, Take 1 tablet (10 mg total) by mouth daily with breakfast. Day1:6pills, Day2:5pills, Day3:4pills, Day4:3pills, Day5:2pills, Day6:1pill (Patient not taking: Reported on 12/22/2016), Disp: 21 tablet, Rfl: 0 .  tadalafil (CIALIS) 20 MG tablet, Take 1 tablet (20 mg total) by mouth daily as needed for erectile dysfunction. (Patient not taking: Reported on 02/12/2017), Disp: 10 tablet, Rfl: 2 .  Vitamin D, Ergocalciferol, (DRISDOL) 50000 units CAPS capsule, Take 1 capsule (50,000 Units total) by mouth once a week. For 12 weeks (Patient not taking: Reported on 12/22/2016), Disp: 12 capsule, Rfl: 0  Allergies  Allergen Reactions  .  Lisinopril     Angioedema  . Omacor  [Omega-3-Acid Ethyl Esters]     Other reaction(s): Rash  . Zocor  [Simvastatin]     Muscle Pain     Review of Systems  Constitutional: Negative for chills and fever (felt warm yesterday, today's temp is 100.14F).  HENT: Negative for sore throat.   Eyes: Negative for blurred vision.  Respiratory: Positive for cough and shortness of breath (from the coughing.). Negative for hemoptysis.   Cardiovascular: Negative for chest pain and palpitations.  Neurological: Negative for headaches.     Objective  Vitals:   02/12/17 0956  BP: (!) 142/78  Pulse: 89  Resp: 16  Temp: (!) 100.4 F (38 C)  TempSrc: Oral  SpO2: 96%  Weight: 243 lb 6.4 oz (110.4 kg)  Height: 6' (1.829 m)    Physical Exam  Constitutional: He is well-developed, well-nourished, and in no distress.  HENT:  Head: Normocephalic and atraumatic.  Right Ear: External ear normal.  Left Ear: External ear normal.  Mouth/Throat: Posterior oropharyngeal erythema present.  Cardiovascular: Normal rate, regular rhythm, S1 normal, S2 normal and normal heart sounds.  No murmur heard. Pulmonary/Chest: Breath sounds normal. He has no decreased breath sounds. He has no wheezes.  Musculoskeletal:       Right ankle: He exhibits no swelling.       Left ankle: He exhibits no swelling.      Assessment & Plan  1. Viral upper respiratory tract infection with cough Started on Cheratussin for relief of coughing associated with influenza - POCT Influenza A/B - guaiFENesin-codeine (CHERATUSSIN AC) 100-10 MG/5ML syrup; Take 10 mLs by mouth 3 (three) times daily as needed for up to 5 days for cough.  Dispense: 150 mL; Refill: 0  2. Essential hypertension Blood pressure elevated likely because of an acute infection, continue on amlodipine and recheck in 3 - amLODipine (NORVASC) 10 MG tablet; Take 1 tablet (10 mg total) by mouth daily.  Dispense: 90 tablet; Refill: 0  3. Influenza Start on 5 days  of Tamiflu for treatment of influenza - oseltamivir (TAMIFLU) 75 MG capsule; Take 1 capsule (75 mg total) by mouth 2 (two) times daily for 5 days.  Dispense: 10 capsule; Refill: 0  Micayla Brathwaite [facility] 02/12/2017 10:11 AM

## 2017-05-10 ENCOUNTER — Other Ambulatory Visit: Payer: Self-pay

## 2017-05-10 DIAGNOSIS — I10 Essential (primary) hypertension: Secondary | ICD-10-CM

## 2017-05-10 MED ORDER — AMLODIPINE BESYLATE 10 MG PO TABS: 10.0000 mg | ORAL_TABLET | Freq: Every day | ORAL | 1 refills | Status: DC

## 2017-05-21 ENCOUNTER — Ambulatory Visit (INDEPENDENT_AMBULATORY_CARE_PROVIDER_SITE_OTHER): Payer: Medicare Other | Admitting: Family Medicine

## 2017-05-21 ENCOUNTER — Encounter: Payer: Self-pay | Admitting: Family Medicine

## 2017-05-21 VITALS — BP 142/80 | HR 52 | Temp 98.1°F | Resp 16 | Ht 72.0 in | Wt 241.1 lb

## 2017-05-21 DIAGNOSIS — R001 Bradycardia, unspecified: Secondary | ICD-10-CM | POA: Diagnosis not present

## 2017-05-21 DIAGNOSIS — E785 Hyperlipidemia, unspecified: Secondary | ICD-10-CM

## 2017-05-21 DIAGNOSIS — E559 Vitamin D deficiency, unspecified: Secondary | ICD-10-CM | POA: Diagnosis not present

## 2017-05-21 DIAGNOSIS — N182 Chronic kidney disease, stage 2 (mild): Secondary | ICD-10-CM

## 2017-05-21 DIAGNOSIS — I10 Essential (primary) hypertension: Secondary | ICD-10-CM | POA: Diagnosis not present

## 2017-05-21 DIAGNOSIS — Z5181 Encounter for therapeutic drug level monitoring: Secondary | ICD-10-CM

## 2017-05-21 DIAGNOSIS — I83813 Varicose veins of bilateral lower extremities with pain: Secondary | ICD-10-CM

## 2017-05-21 NOTE — Assessment & Plan Note (Signed)
He'll monitor at home; try to reduce sodium; he wishes to not add another pill at this point; call me if over 140 on top

## 2017-05-21 NOTE — Progress Notes (Signed)
BP (!) 142/80   Pulse (!) 52   Temp 98.1 F (36.7 C) (Oral)   Resp 16   Ht 6' (1.829 m)   Wt 241 lb 1.6 oz (109.4 kg)   SpO2 92%   BMI 32.70 kg/m    Subjective:    Patient ID: Blake Rivera, male    DOB: 1934/12/05, 83 y.o.   MRN: [id number]  HPI: Blake Rivera is a 83 y.o. male  Chief Complaint  Patient presents with  . Follow-up    HPI Patient is new to me; previous provider left our practice  He has hypertension, treated with amlodipine; checks it at home sometimes; tries to keep in the higher 120s, low 130s Came fasting except for coffee, maybe had some pizza yesterday; adds a little salt, tries to not cook with it; HTN does run in the family; he just got HTN when he retired  Vitamin D deficiency Last 3 readings have all been low; last was 9 on July 03, 2016; energy level is good, taking B12 as well  CKD; last creatinine was 1.14; last GFR 70  High cholesterol; quit taking the statin, bad dreams  Varicose veins; had them treated, came back in the left leg mostly, some pain associated with them; active, going to play golf today  Functional Status Survey: Is the patient deaf or have difficulty hearing?: No Does the patient have difficulty seeing, even when wearing glasses/contacts?: No Does the patient have difficulty concentrating, remembering, or making decisions?: No Does the patient have difficulty walking or climbing stairs?: No Does the patient have difficulty dressing or bathing?: No Does the patient have difficulty doing errands alone such as visiting a doctor's office or shopping?: No   Fall Risk  05/21/2017 02/12/2017 12/22/2016 08/22/2016 07/03/2016  Falls in the past year? Yes No No No No  Number falls in past yr: 1 - - - -  Injury with Fall? No - - - -  Risk for fall due to : - - - - -  Risk for fall due to: Comment - - - - -  Follow up - - - - -     Depression screen [facility] 2/9 05/21/2017 02/12/2017 12/22/2016 08/22/2016 07/03/2016  Decreased Interest  0 0 0 0 0  Down, Depressed, Hopeless 0 0 0 0 0  PHQ - 2 Score 0 0 0 0 0    Relevant past medical, surgical, family and social history reviewed Past Medical History:  Diagnosis Date  . Diverticulitis of colon   . ED (erectile dysfunction)   . Hyperglycemia   . Hypertension   . Vitamin D deficiency    Past Surgical History:  Procedure Laterality Date  . HERNIA REPAIR     Family History  Problem Relation Age of Onset  . Heart disease Mother   . Aneurysm Mother   . Heart disease Brother   . Emphysema Father   . Healthy Sister   . Healthy Brother   . Healthy Brother   . Heart disease Sister    Social History   Tobacco Use  . Smoking status: Former Smoker    Packs/day: 0.25    Years: 25.00    Pack years: 6.25    Types: Cigarettes    Last attempt to quit: 05/18/2014    Years since quitting: 3.0  . Smokeless tobacco: Never Used  . Tobacco comment: Former smoker - no need to provide with smoking cessation materials  Substance Use Topics  . Alcohol  use: Yes    Alcohol/week: 0.6 oz    Types: 1 Shots of liquor per week    Comment: moderate - 1 whiskey shot per day  . Drug use: No    Interim medical history since last visit reviewed. Allergies and medications reviewed  Review of Systems Per HPI unless specifically indicated above     Objective:    BP (!) 142/80   Pulse (!) 52   Temp 98.1 F (36.7 C) (Oral)   Resp 16   Ht 6' (1.829 m)   Wt 241 lb 1.6 oz (109.4 kg)   SpO2 92%   BMI 32.70 kg/m   Wt Readings from Last 3 Encounters:  05/21/17 241 lb 1.6 oz (109.4 kg)  02/12/17 243 lb 6.4 oz (110.4 kg)  12/22/16 248 lb 8 oz (112.7 kg)    Physical Exam  Constitutional: He appears well-developed and well-nourished. No distress.  HENT:  Head: Normocephalic and atraumatic.  Eyes: EOM are normal. No scleral icterus.  Neck: No thyromegaly present.  Cardiovascular: Normal rate and regular rhythm.  Pulmonary/Chest: Effort normal and breath sounds normal.    Abdominal: Soft. Bowel sounds are normal. He exhibits no distension.  Musculoskeletal: He exhibits no edema.  Neurological: Coordination normal.  Skin: Skin is warm and dry. No pallor.  Prominent varicose veins in the legs, left >> right; no ulcers  Psychiatric: He has a normal mood and affect. His behavior is normal. Judgment and thought content normal.       Assessment & Plan:   Problem List Items Addressed This Visit      Cardiovascular and Mediastinum   HTN (hypertension) - Primary    He'll monitor at home; try to reduce sodium; he wishes to not add another pill at this point; call me if over 140 on top        Other   Dyslipidemia    Check lipids today; limit saturated fats      Relevant Orders   Lipid panel   Avitaminosis D    Check level      Relevant Orders   VITAMIN D 25 Hydroxy (Vit-D Deficiency, Fractures)    Other Visit Diagnoses    Varicose veins of both lower extremities with pain       Relevant Orders   Ambulatory referral to Vascular Surgery   Bradycardia       Relevant Orders   TSH   CKD (chronic kidney disease) stage 2, GFR 60-89 ml/min       Relevant Orders   COMPLETE METABOLIC PANEL WITH GFR   Encounter for medication monitoring       Relevant Orders   COMPLETE METABOLIC PANEL WITH GFR       Follow up plan: Return in about 6 months (around 11/21/2017) for follow-up visit with Dr. Sanda Klein.  An after-visit summary was printed and given to the patient at [facility].  Please see the patient instructions which may contain other information and recommendations beyond what is mentioned above in the assessment and plan.  No orders of the defined types were placed in this encounter.   Orders Placed This Encounter  Procedures  . COMPLETE METABOLIC PANEL WITH GFR  . TSH  . VITAMIN D 25 Hydroxy (Vit-D Deficiency, Fractures)  . Lipid panel  . Ambulatory referral to Vascular Surgery

## 2017-05-21 NOTE — Patient Instructions (Addendum)
Try to follow the DASH guidelines (DASH stands for Dietary Approaches to Stop Hypertension). Try to limit the sodium in your diet to no more than 1,500mg of sodium per day. Certainly try to not exceed 2,000 mg per day at the very most. Do not add salt when cooking or at the table.  Check the sodium amount on labels when shopping, and choose items lower in sodium when given a choice. Avoid or limit foods that already contain a lot of sodium. Eat a diet rich in fruits and vegetables and whole grains, and try to lose weight if overweight or obese  DASH Eating Plan DASH stands for "Dietary Approaches to Stop Hypertension." The DASH eating plan is a healthy eating plan that has been shown to reduce high blood pressure (hypertension). It may also reduce your risk for type 2 diabetes, heart disease, and stroke. The DASH eating plan may also help with weight loss. What are tips for following this plan? General guidelines  Avoid eating more than 2,300 mg (milligrams) of salt (sodium) a day. If you have hypertension, you may need to reduce your sodium intake to 1,500 mg a day.  Limit alcohol intake to no more than 1 drink a day for nonpregnant women and 2 drinks a day for men. One drink equals 12 oz of beer, 5 oz of wine, or 1 oz of hard liquor.  Work with your health care provider to maintain a healthy body weight or to lose weight. Ask what an ideal weight is for you.  Get at least 30 minutes of exercise that causes your heart to beat faster (aerobic exercise) most days of the week. Activities may include walking, swimming, or biking.  Work with your health care provider or diet and nutrition specialist (dietitian) to adjust your eating plan to your individual calorie needs. Reading food labels  Check food labels for the amount of sodium per serving. Choose foods with less than 5 percent of the Daily Value of sodium. Generally, foods with less than 300 mg of sodium per serving fit into this eating  plan.  To find whole grains, look for the word "whole" as the first word in the ingredient list. Shopping  Buy products labeled as "low-sodium" or "no salt added."  Buy fresh foods. Avoid canned foods and premade or frozen meals. Cooking  Avoid adding salt when cooking. Use salt-free seasonings or herbs instead of table salt or sea salt. Check with your health care provider or pharmacist before using salt substitutes.  Do not fry foods. Cook foods using healthy methods such as baking, boiling, grilling, and broiling instead.  Cook with heart-healthy oils, such as olive, canola, soybean, or sunflower oil. Meal planning   Eat a balanced diet that includes: ? 5 or more servings of fruits and vegetables each day. At each meal, try to fill half of your plate with fruits and vegetables. ? Up to 6-8 servings of whole grains each day. ? Less than 6 oz of lean meat, poultry, or fish each day. A 3-oz serving of meat is about the same size as a deck of cards. One egg equals 1 oz. ? 2 servings of low-fat dairy each day. ? A serving of nuts, seeds, or beans 5 times each week. ? Heart-healthy fats. Healthy fats called Omega-3 fatty acids are found in foods such as flaxseeds and coldwater fish, like sardines, salmon, and mackerel.  Limit how much you eat of the following: ? Canned or prepackaged foods. ? Food that   Food that is high in trans fat, such as fried foods. ? Food that is high in saturated fat, such as fatty meat. ? Sweets, desserts, sugary drinks, and other foods with added sugar. ? Full-fat dairy products.  Do not salt foods before eating.  Try to eat at least 2 vegetarian meals each week.  Eat more home-cooked food and less restaurant, buffet, and fast food.  When eating at a restaurant, ask that your food be prepared with less salt or no salt, if possible. What foods are recommended? The items listed may not be a complete list. Talk with your dietitian about what dietary choices are best  for you. Grains Whole-grain or whole-wheat bread. Whole-grain or whole-wheat pasta. Brown rice. [place]. Bulgur. Whole-grain and low-sodium cereals. Pita bread. Low-fat, low-sodium crackers. Whole-wheat flour tortillas. Vegetables Fresh or frozen vegetables (raw, steamed, roasted, or grilled). Low-sodium or reduced-sodium tomato and vegetable juice. Low-sodium or reduced-sodium tomato sauce and tomato paste. Low-sodium or reduced-sodium canned vegetables. Fruits All fresh, dried, or frozen fruit. Canned fruit in natural juice (without added sugar). Meat and other protein foods Skinless chicken or [place]. Ground chicken or [place]. Pork with fat trimmed off. Fish and seafood. Egg whites. Dried beans, peas, or lentils. Unsalted nuts, nut butters, and seeds. Unsalted canned beans. Lean cuts of beef with fat trimmed off. Low-sodium, lean deli meat. Dairy Low-fat (1%) or fat-free (skim) milk. Fat-free, low-fat, or reduced-fat cheeses. Nonfat, low-sodium ricotta or cottage cheese. Low-fat or nonfat yogurt. Low-fat, low-sodium cheese. Fats and oils Soft margarine without trans fats. Vegetable oil. Low-fat, reduced-fat, or light mayonnaise and salad dressings (reduced-sodium). Canola, safflower, olive, soybean, and sunflower oils. Avocado. Seasoning and other foods Herbs. Spices. Seasoning mixes without salt. Unsalted popcorn and pretzels. Fat-free sweets. What foods are not recommended? The items listed may not be a complete list. Talk with your dietitian about what dietary choices are best for you. Grains Baked goods made with fat, such as croissants, muffins, or some breads. Dry pasta or rice meal packs. Vegetables Creamed or fried vegetables. Vegetables in a cheese sauce. Regular canned vegetables (not low-sodium or reduced-sodium). Regular canned tomato sauce and paste (not low-sodium or reduced-sodium). Regular tomato and vegetable juice (not low-sodium or reduced-sodium). Angie Fava.  Olives. Fruits Canned fruit in a light or heavy syrup. Fried fruit. Fruit in cream or butter sauce. Meat and other protein foods Fatty cuts of meat. Ribs. Fried meat. Berniece Salines. Sausage. Bologna and other processed lunch meats. Salami. Fatback. Hotdogs. Bratwurst. Salted nuts and seeds. Canned beans with added salt. Canned or smoked fish. Whole eggs or egg yolks. Chicken or [place] with skin. Dairy Whole or 2% milk, cream, and half-and-half. Whole or full-fat cream cheese. Whole-fat or sweetened yogurt. Full-fat cheese. Nondairy creamers. Whipped toppings. Processed cheese and cheese spreads. Fats and oils Butter. Stick margarine. Lard. Shortening. Ghee. Bacon fat. Tropical oils, such as coconut, palm kernel, or palm oil. Seasoning and other foods Salted popcorn and pretzels. Onion salt, garlic salt, seasoned salt, table salt, and sea salt. Worcestershire sauce. Tartar sauce. Barbecue sauce. Teriyaki sauce. Soy sauce, including reduced-sodium. Steak sauce. Canned and packaged gravies. Fish sauce. Oyster sauce. Cocktail sauce. Horseradish that you find on the shelf. Ketchup. Mustard. Meat flavorings and tenderizers. Bouillon cubes. Hot sauce and Tabasco sauce. Premade or packaged marinades. Premade or packaged taco seasonings. Relishes. Regular salad dressings. Where to find more information:  National Heart, Lung, and [place]: https://wilson-eaton.com/  American Heart Association: www.heart.org Summary  The DASH eating plan is  eating plan that has been shown to reduce high blood pressure (hypertension). It may also reduce your risk for type 2 diabetes, heart disease, and stroke.  With the DASH eating plan, you should limit salt (sodium) intake to 2,300 mg a day. If you have hypertension, you may need to reduce your sodium intake to 1,500 mg a day.  When on the DASH eating plan, aim to eat more fresh fruits and vegetables, whole grains, lean proteins, low-fat dairy, and heart-healthy  fats.  Work with your health care provider or diet and nutrition specialist (dietitian) to adjust your eating plan to your individual calorie needs. This information is not intended to replace advice given to you by your health care provider. Make sure you discuss any questions you have with your health care provider. Document Released: 12/22/2010 Document Revised: 12/27/2015 Document Reviewed: 12/27/2015 Elsevier Interactive Patient Education  2018 Elsevier Inc.  

## 2017-05-21 NOTE — Assessment & Plan Note (Signed)
Check level 

## 2017-05-21 NOTE — Assessment & Plan Note (Signed)
Check lipids today; limit saturated fats 

## 2017-05-22 ENCOUNTER — Other Ambulatory Visit: Payer: Self-pay | Admitting: Family Medicine

## 2017-05-22 ENCOUNTER — Other Ambulatory Visit: Payer: Self-pay

## 2017-05-22 DIAGNOSIS — E785 Hyperlipidemia, unspecified: Secondary | ICD-10-CM

## 2017-05-22 LAB — COMPLETE METABOLIC PANEL WITH GFR
AG Ratio: 1.6 (calc) (ref 1.0–2.5)
ALBUMIN MSPROF: 4.2 g/dL (ref 3.6–5.1)
ALKALINE PHOSPHATASE (APISO): 71 U/L (ref 40–115)
ALT: 14 U/L (ref 9–46)
AST: 20 U/L (ref 10–35)
BUN / CREAT RATIO: 10 (calc) (ref 6–22)
BUN: 12 mg/dL (ref 7–25)
CALCIUM: 9.3 mg/dL (ref 8.6–10.3)
CO2: 30 mmol/L (ref 20–32)
CREATININE: 1.17 mg/dL — AB (ref 0.70–1.11)
Chloride: 106 mmol/L (ref 98–110)
GFR, EST NON AFRICAN AMERICAN: 58 mL/min/{1.73_m2} — AB (ref >=60)
GFR, Est African American: 67 mL/min/{1.73_m2} (ref >=60)
Globulin: 2.6 g/dL (calc) (ref 1.9–3.7)
Glucose, Bld: 106 mg/dL — ABNORMAL HIGH (ref 65–99)
Potassium: 4.3 mmol/L (ref 3.5–5.3)
SODIUM: 140 mmol/L (ref 135–146)
Total Bilirubin: 0.3 mg/dL (ref 0.2–1.2)
Total Protein: 6.8 g/dL (ref 6.1–8.1)

## 2017-05-22 LAB — TSH: TSH: 2.86 mIU/L (ref 0.40–4.50)

## 2017-05-22 LAB — VITAMIN D 25 HYDROXY (VIT D DEFICIENCY, FRACTURES): Vit D, 25-Hydroxy: 21 ng/mL — ABNORMAL LOW (ref 30–100)

## 2017-05-22 LAB — LIPID PANEL
CHOLESTEROL: 182 mg/dL (ref <200)
HDL: 47 mg/dL (ref >40)
LDL Cholesterol (Calc): 117 mg/dL (calc) — ABNORMAL HIGH
NON-HDL CHOLESTEROL (CALC): 135 mg/dL — AB (ref <130)
TRIGLYCERIDES: 83 mg/dL (ref <150)
Total CHOL/HDL Ratio: 3.9 (calc) (ref <5.0)

## 2017-05-22 MED ORDER — VITAMIN D (ERGOCALCIFEROL) 1.25 MG (50000 UNIT) PO CAPS: 50000.0000 [IU] | ORAL_CAPSULE | ORAL | 0 refills | Status: AC

## 2017-05-22 MED ORDER — ATORVASTATIN CALCIUM 10 MG PO TABS: 10.0000 mg | ORAL_TABLET | ORAL | 1 refills | Status: DC

## 2017-05-22 NOTE — Progress Notes (Signed)
Start Rx vit D 

## 2017-06-21 ENCOUNTER — Other Ambulatory Visit: Payer: Self-pay | Admitting: Family Medicine

## 2017-07-05 ENCOUNTER — Other Ambulatory Visit: Payer: Self-pay | Admitting: Family Medicine

## 2017-07-12 ENCOUNTER — Other Ambulatory Visit: Payer: Self-pay | Admitting: Family Medicine

## 2017-07-12 NOTE — Telephone Encounter (Signed)
Vit D Rx requested; denied  See note from May 22, 2017: "vitamin D is low, so start Rx vit D once a week for 4 weeks, then start 1,000 iu OTC vitamin D3 once a day"

## 2017-07-18 ENCOUNTER — Other Ambulatory Visit: Payer: Self-pay | Admitting: Family Medicine

## 2017-07-20 NOTE — Telephone Encounter (Signed)
Please contact patient We had hoped to get repeat cholesterol panel 6-8 weeks after he started the medicine to see if it's the right strength Please ask him to come in during the next 5-6 days to get his labs drawn; he does not have to be fasting I'll send in one more week to give him time to get in; thank you

## 2017-10-16 ENCOUNTER — Ambulatory Visit (INDEPENDENT_AMBULATORY_CARE_PROVIDER_SITE_OTHER): Payer: Medicare Other

## 2017-10-16 DIAGNOSIS — Z23 Encounter for immunization: Secondary | ICD-10-CM

## 2017-11-15 ENCOUNTER — Other Ambulatory Visit: Payer: Self-pay | Admitting: Family Medicine

## 2017-11-15 DIAGNOSIS — I10 Essential (primary) hypertension: Secondary | ICD-10-CM

## 2017-11-22 ENCOUNTER — Ambulatory Visit: Payer: Medicare Other | Admitting: Family Medicine

## 2017-12-03 ENCOUNTER — Ambulatory Visit (INDEPENDENT_AMBULATORY_CARE_PROVIDER_SITE_OTHER): Payer: Medicare Other | Admitting: Family Medicine

## 2017-12-03 ENCOUNTER — Encounter: Payer: Self-pay | Admitting: Family Medicine

## 2017-12-03 VITALS — BP 120/74 | HR 70 | Temp 98.1°F | Ht 72.0 in | Wt 240.3 lb

## 2017-12-03 DIAGNOSIS — M25511 Pain in right shoulder: Secondary | ICD-10-CM

## 2017-12-03 DIAGNOSIS — M25512 Pain in left shoulder: Secondary | ICD-10-CM

## 2017-12-03 DIAGNOSIS — E669 Obesity, unspecified: Secondary | ICD-10-CM | POA: Insufficient documentation

## 2017-12-03 DIAGNOSIS — E785 Hyperlipidemia, unspecified: Secondary | ICD-10-CM

## 2017-12-03 DIAGNOSIS — I10 Essential (primary) hypertension: Secondary | ICD-10-CM

## 2017-12-03 DIAGNOSIS — G8929 Other chronic pain: Secondary | ICD-10-CM

## 2017-12-03 DIAGNOSIS — R972 Elevated prostate specific antigen [PSA]: Secondary | ICD-10-CM

## 2017-12-03 DIAGNOSIS — D170 Benign lipomatous neoplasm of skin and subcutaneous tissue of head, face and neck: Secondary | ICD-10-CM

## 2017-12-03 DIAGNOSIS — N182 Chronic kidney disease, stage 2 (mild): Secondary | ICD-10-CM

## 2017-12-03 DIAGNOSIS — Z5181 Encounter for therapeutic drug level monitoring: Secondary | ICD-10-CM

## 2017-12-03 NOTE — Assessment & Plan Note (Signed)
Well-controlled; continue amlodipine

## 2017-12-03 NOTE — Assessment & Plan Note (Signed)
Limit NSAIDs; check Cr today

## 2017-12-03 NOTE — Assessment & Plan Note (Signed)
Check lipids today; limit saturated fats 

## 2017-12-03 NOTE — Assessment & Plan Note (Signed)
Encouraged weight loss, 5-10 pounds

## 2017-12-03 NOTE — Assessment & Plan Note (Signed)
Explained to patient that we cut off routine screening at age 83; explained rationale; consider being left impotent, incontinent with treatment vs leaving alone if cancer present and perhaps dying from something else; he still wants PSA checked

## 2017-12-03 NOTE — Assessment & Plan Note (Signed)
Patient is welcome to call me to get a referral to a general surgeon if he'd like this removed

## 2017-12-03 NOTE — Progress Notes (Signed)
BP 120/74   Pulse 70   Temp 98.1 F (36.7 C)   Ht 6' (1.829 m)   Wt 240 lb 4.8 oz (109 kg)   SpO2 93%   BMI 32.59 kg/m    Subjective:    Patient ID: Blake Rivera, male    DOB: 09-11-1934, 83 y.o.   MRN: [id number]  HPI: Blake Rivera is a 83 y.o. male  Chief Complaint  Patient presents with  . Follow-up    HPI Here for f/u  High blood pressure; well-controlled; not checking BP away from the doctor; on amlodipine; uses just a little sea salt when cooking  He has pain in the left shoulder; bothering him "forever", worsening over the last few months; has seen ortho for the right knee; limited in abduction to the side and the front; can reach behind him just fine; right-handed; nothing repetitive with the left arm; no old injuries; he used to have sciatic trouble; uses TENS unit at times which helps; pulsates and that helps some; worse pain is up to 10 at times; sitting still is a 0 out of 10; has been frostbitten before; tips of fingers cool but toes are okay; that's not new  High cholesterol; he has stopped the atorvastatin; did not have any problems; had lunch already today; limits saturated fats; bacon once a week, sausage once a week; not much cheese; occasional whole grains  Lab Results  Component Value Date   CHOL 182 05/21/2017   HDL 47 05/21/2017   LDLCALC 117 (H) 05/21/2017   TRIG 83 05/21/2017   CHOLHDL 3.9 05/21/2017   Vitamin D deficiency; taking supplement now  Rising PSA: it had gone from 1.5 to 2.2; then last was 1.1; urination is okay; no one the family with prostate cancer  CKD stage 2; occasional advil for joint pains, but not in the last year  He has a soft growth on the left side upper forehead; appeared after sister's funeral; has been there 20-25 years  Depression screen [facility] 2/9 12/03/2017 05/21/2017 02/12/2017 12/22/2016 08/22/2016  Decreased Interest 0 0 0 0 0  Down, Depressed, Hopeless 0 0 0 0 0  PHQ - 2 Score 0 0 0 0 0  Altered sleeping 0 -  - - -  Tired, decreased energy 0 - - - -  Change in appetite 0 - - - -  Feeling bad or failure about yourself  0 - - - -  Trouble concentrating 0 - - - -  Suicidal thoughts 0 - - - -  PHQ-9 Score 0 - - - -   Fall Risk  12/03/2017 05/21/2017 02/12/2017 12/22/2016 08/22/2016  Falls in the past year? 0 Yes No No No  Number falls in past yr: - 1 - - -  Injury with Fall? - No - - -  Risk for fall due to : - - - - -  Risk for fall due to: Comment - - - - -  Follow up - - - - -    Relevant past medical, surgical, family and social history reviewed Past Medical History:  Diagnosis Date  . Diverticulitis of colon   . ED (erectile dysfunction)   . Hyperglycemia   . Hypertension   . Vitamin D deficiency    Past Surgical History:  Procedure Laterality Date  . HERNIA REPAIR     Family History  Problem Relation Age of Onset  . Heart disease Mother   . Aneurysm Mother   .  Heart disease Brother   . Emphysema Father   . Healthy Sister   . Healthy Brother   . Healthy Brother   . Heart disease Sister    Social History   Tobacco Use  . Smoking status: Former Smoker    Packs/day: 0.25    Years: 25.00    Pack years: 6.25    Types: Cigarettes    Last attempt to quit: 05/18/2014    Years since quitting: 3.5  . Smokeless tobacco: Never Used  . Tobacco comment: Former smoker - no need to provide with smoking cessation materials  Substance Use Topics  . Alcohol use: Yes    Alcohol/week: 1.0 standard drinks    Types: 1 Shots of liquor per week    Comment: moderate - 1 whiskey shot per day  . Drug use: No     [facility] Visit from 12/03/2017 in [facility]  AUDIT-C Score  4      Interim medical history since last visit reviewed. Allergies and medications reviewed  Review of Systems Per HPI unless specifically indicated above     Objective:    BP 120/74   Pulse 70   Temp 98.1 F (36.7 C)   Ht 6' (1.829 m)   Wt 240 lb 4.8 oz (109 kg)   SpO2 93%   BMI 32.59  kg/m   Wt Readings from Last 3 Encounters:  12/03/17 240 lb 4.8 oz (109 kg)  05/21/17 241 lb 1.6 oz (109.4 kg)  02/12/17 243 lb 6.4 oz (110.4 kg)    Physical Exam  Constitutional: He appears well-developed and well-nourished. No distress.  HENT:  Head: Normocephalic and atraumatic.  Eyes: EOM are normal. No scleral icterus.  Neck: No thyromegaly present.  Cardiovascular: Normal rate and regular rhythm.  Pulmonary/Chest: Effort normal and breath sounds normal.  Abdominal: Soft. Bowel sounds are normal. He exhibits no distension.  Musculoskeletal: He exhibits no edema.       Left shoulder: He exhibits decreased range of motion and tenderness. He exhibits no deformity, no spasm and normal strength.  Neurological: Coordination normal.  Skin: Skin is warm and dry. No pallor.     Soft growth over the upper forehead left of midline  Psychiatric: He has a normal mood and affect. His behavior is normal. Judgment and thought content normal. His mood appears not anxious. He does not exhibit a depressed mood.    Results for orders placed or performed in visit on 05/21/17  COMPLETE METABOLIC PANEL WITH GFR  Result Value Ref Range   Glucose, Bld 106 (H) 65 - 99 mg/dL   BUN 12 7 - 25 mg/dL   Creat 1.17 (H) 0.70 - 1.11 mg/dL   GFR, Est Non African American 58 (L) > OR = 60 mL/min/1.75m2   GFR, Est African American 67 > OR = 60 mL/min/1.79m2   BUN/Creatinine Ratio 10 6 - 22 (calc)   Sodium 140 135 - 146 mmol/L   Potassium 4.3 3.5 - 5.3 mmol/L   Chloride 106 98 - 110 mmol/L   CO2 30 20 - 32 mmol/L   Calcium 9.3 8.6 - 10.3 mg/dL   Total Protein 6.8 6.1 - 8.1 g/dL   Albumin 4.2 3.6 - 5.1 g/dL   Globulin 2.6 1.9 - 3.7 g/dL (calc)   AG Ratio 1.6 1.0 - 2.5 (calc)   Total Bilirubin 0.3 0.2 - 1.2 mg/dL   Alkaline phosphatase (APISO) 71 40 - 115 U/L   AST 20 10 - 35 U/L  ALT 14 9 - 46 U/L  TSH  Result Value Ref Range   TSH 2.86 0.40 - 4.50 mIU/L  VITAMIN D 25 Hydroxy (Vit-D Deficiency,  Fractures)  Result Value Ref Range   Vit D, 25-Hydroxy 21 (L) 30 - 100 ng/mL  Lipid panel  Result Value Ref Range   Cholesterol 182 <200 mg/dL   HDL 47 >40 mg/dL   Triglycerides 83 <150 mg/dL   LDL Cholesterol (Calc) 117 (H) mg/dL (calc)   Total CHOL/HDL Ratio 3.9 <5.0 (calc)   Non-HDL Cholesterol (Calc) 135 (H) <130 mg/dL (calc)      Assessment & Plan:   Problem List Items Addressed This Visit      Cardiovascular and Mediastinum   HTN (hypertension) (Chronic)    Well-controlled; continue amlodipine        Musculoskeletal and Integument   Lipoma of scalp    Patient is welcome to call me to get a referral to a general surgeon if he'd like this removed        Genitourinary   CKD (chronic kidney disease) stage 2, GFR 60-89 ml/min (Chronic)    Limit NSAIDs; check Cr today        Other   Rising PSA level    Explained to patient that we cut off routine screening at age 27; explained rationale; consider being left impotent, incontinent with treatment vs leaving alone if cancer present and perhaps dying from something else; he still wants PSA checked      Relevant Orders   PSA   Obesity (BMI 30.0-34.9)    Encouraged weight loss, 5-10 pounds      Dyslipidemia (Chronic)    Check lipids today; limit saturated fats      Relevant Orders   Lipid panel    Other Visit Diagnoses    Chronic left shoulder pain    -  Primary   Relevant Orders   Ambulatory referral to Orthopedic Surgery   Chronic right shoulder pain       Relevant Orders   Ambulatory referral to Orthopedic Surgery   Encounter for medication monitoring       Relevant Orders   COMPLETE METABOLIC PANEL WITH GFR       Follow up plan: Return in about 6 months (around 06/03/2018) for follow-up visit with Dr. Sanda Klein.  An after-visit summary was printed and given to the patient at [facility].  Please see the patient instructions which may contain other information and recommendations beyond what is mentioned above  in the assessment and plan.  No orders of the defined types were placed in this encounter.   Orders Placed This Encounter  Procedures  . PSA  . COMPLETE METABOLIC PANEL WITH GFR  . Lipid panel  . Ambulatory referral to Orthopedic Surgery

## 2017-12-03 NOTE — Patient Instructions (Addendum)
We'll have you see the orthopaedist We'll get labs today If you have not heard anything from my staff in a week about any orders/referrals/studies from today, please contact us here to follow-up ([phone] Try to lose 5-10 pounds over the next few months   Obesity, Adult Obesity is having too much body fat. If you have a BMI of 30 or more, you are obese. BMI is a number that explains how much body fat you have. Obesity is often caused by taking in (consuming) more calories than your body uses. Obesity can cause serious health problems. Changing your lifestyle can help to treat obesity. Follow these instructions at home: Eating and drinking   Follow advice from your doctor about what to eat and drink. Your doctor may tell you to: ? Cut down on (limit) fast foods, sweets, and processed snack foods. ? Choose low-fat options. For example, choose low-fat milk instead of whole milk. ? Eat 5 or more servings of fruits or vegetables every day. ? Eat at home more often. This gives you more control over what you eat. ? Choose healthy foods when you eat out. ? Learn what a healthy portion size is. A portion size is the amount of a certain food that is healthy for you to eat at one time. This is different for each person. ? Keep low-fat snacks available. ? Avoid sugary drinks. These include soda, fruit juice, iced tea that is sweetened with sugar, and flavored milk. ? Eat a healthy breakfast.  Drink enough water to keep your pee (urine) clear or pale yellow.  Do not go without eating for long periods of time (do not fast).  Do not go on popular or trendy diets (fad diets). Physical Activity  Exercise often, as told by your doctor. Ask your doctor: ? What types of exercise are safe for you. ? How often you should exercise.  Warm up and stretch before being active.  Do slow stretching after being active (cool down).  Rest between times of being active. Lifestyle  Limit how much time  you spend in front of your TV, computer, or video game system (be less sedentary).  Find ways to reward yourself that do not involve food.  Limit alcohol intake to no more than 1 drink a day for nonpregnant women and 2 drinks a day for men. One drink equals 12 oz of beer, 5 oz of wine, or 1 oz of hard liquor. General instructions  Keep a weight loss journal. This can help you keep track of: ? The food that you eat. ? The exercise that you do.  Take over-the-counter and prescription medicines only as told by your doctor.  Take vitamins and supplements only as told by your doctor.  Think about joining a support group. Your doctor may be able to help with this.  Keep all follow-up visits as told by your doctor. This is important. Contact a doctor if:  You cannot meet your weight loss goal after you have changed your diet and lifestyle for 6 weeks. This information is not intended to replace advice given to you by your health care provider. Make sure you discuss any questions you have with your health care provider. Document Released: 03/27/2011 Document Revised: 06/10/2015 Document Reviewed: 10/21/2014 Elsevier Interactive Patient Education  2018 [employer].  Preventing Unhealthy [employer], Adult Staying at a healthy weight is important. When fat builds up in your body, you may become overweight or obese. These conditions put you at greater  risk for developing certain health problems, such as heart disease, diabetes, sleeping problems, joint problems, and some cancers. Unhealthy weight gain is often the result of making unhealthy choices in what you eat. It is also a result of not getting enough exercise. You can make changes to your lifestyle to prevent obesity and stay as healthy as possible. What nutrition changes can be made? To maintain a healthy weight and prevent obesity:  Eat only as much as your body needs. To do this: ? Pay attention to signs that you are hungry or full.  Stop eating as soon as you feel full. ? If you feel hungry, try drinking water first. Drink enough water so your urine is clear or pale yellow. ? Eat smaller portions. ? Look at serving sizes on food labels. Most foods contain more than one serving per container. ? Eat the recommended amount of calories for your gender and activity level. While most active people should eat around 2,000 calories per day, if you are trying to lose weight or are not very active, you main need to eat less calories. Talk to your health care provider or dietitian about how many calories you should eat each day.  Choose healthy foods, such as: ? Fruits and vegetables. Try to fill at least half of your plate at each meal with fruits and vegetables. ? Whole grains, such as whole wheat bread, brown rice, and quinoa. ? Lean meats, such as chicken or fish. ? Other healthy proteins, such as beans, eggs, or tofu. ? Healthy fats, such as nuts, seeds, fatty fish, and olive oil. ? Low-fat or fat-free dairy.  Check food labels and avoid food and drinks that: ? Are high in calories. ? Have added sugar. ? Are high in sodium. ? Have saturated fats or trans fats.  Limit how much you eat of the following foods: ? Prepackaged meals. ? Fast food. ? Fried foods. ? Processed meat, such as bacon, sausage, and deli meats. ? Fatty cuts of red meat and poultry with skin.  Cook foods in healthier ways, such as by baking, broiling, or grilling.  When grocery shopping, try to shop around the outside of the store. This helps you buy mostly fresh foods and avoid canned and prepackaged foods.  What lifestyle changes can be made?  Exercise at least 30 minutes 5 or more days each week. Exercising includes brisk walking, yard work, biking, running, swimming, and team sports like basketball and soccer. Ask your health care provider which exercises are safe for you.  Do not use any products that contain nicotine or tobacco, such as  cigarettes and e-cigarettes. If you need help quitting, ask your health care provider.  Limit alcohol intake to no more than 1 drink a day for nonpregnant women and 2 drinks a day for men. One drink equals 12 oz of beer, 5 oz of wine, or 1 oz of hard liquor.  Try to get 7-9 hours of sleep each night. What other changes can be made?  Keep a food and activity journal to keep track of: ? What you ate and how many calories you had. Remember to count sauces, dressings, and side dishes. ? Whether you were active, and what exercises you did. ? Your calorie, weight, and activity goals.  Check your weight regularly. Track any changes. If you notice you have gained weight, make changes to your diet or activity routine.  Avoid taking weight-loss medicines or supplements. Talk to your health care provider before starting  any new medicine or supplement.  Talk to your health care provider before trying any new diet or exercise plan. Why are these changes important? Eating healthy, staying active, and having healthy habits not only help prevent obesity, they also:  Help you to manage stress and emotions.  Help you to connect with friends and family.  Improve your self-esteem.  Improve your sleep.  Prevent long-term health problems.  What can happen if changes are not made? Being obese or overweight can cause you to develop joint or bone problems, which can make it hard for you to stay active or do activities you enjoy. Being obese or overweight also puts stress on your heart and lungs and can lead to health problems like diabetes, heart disease, and some cancers. Where to find more information: Talk with your health care provider or a dietitian about healthy eating and healthy lifestyle choices. You may also find other information through these resources:  [place]. Department of Agriculture MyPlate: FormerBoss.no  American Heart Association: www.heart.org  Centers for Disease Control and  Prevention: http://www.wolf.info/  Summary  Staying at a healthy weight is important. It helps prevent certain diseases and health problems, such as heart disease, diabetes, joint problems, sleep disorders, and some cancers.  Being obese or overweight can cause you to develop joint or bone problems, which can make it hard for you to stay active or do activities you enjoy.  You can prevent unhealthy weight gain by eating a healthy diet, exercising regularly, not smoking, limiting alcohol, and getting enough sleep.  Talk with your health care provider or a dietitian for guidance about healthy eating and healthy lifestyle choices. This information is not intended to replace advice given to you by your health care provider. Make sure you discuss any questions you have with your health care provider. Document Released: 01/04/2016 Document Revised: 02/09/2016 Document Reviewed: 02/09/2016 Elsevier Interactive Patient Education  [employer].

## 2017-12-25 ENCOUNTER — Ambulatory Visit (INDEPENDENT_AMBULATORY_CARE_PROVIDER_SITE_OTHER): Payer: Medicare Other

## 2017-12-25 VITALS — BP 128/72 | HR 81 | Temp 98.1°F | Resp 16 | Ht 72.0 in | Wt 241.6 lb

## 2017-12-25 DIAGNOSIS — Z0000 Encounter for general adult medical examination without abnormal findings: Secondary | ICD-10-CM

## 2017-12-25 DIAGNOSIS — M1991 Primary osteoarthritis, unspecified site: Secondary | ICD-10-CM | POA: Insufficient documentation

## 2017-12-25 NOTE — Patient Instructions (Addendum)
Blake Rivera , Thank you for taking time to come for your Medicare Wellness Visit. I appreciate your ongoing commitment to your health goals. Please review the following plan we discussed and let me know if I can assist you in the future.   Screening recommendations/referrals: Recommended yearly ophthalmology/optometry visit for glaucoma screening and checkup Recommended yearly dental visit for hygiene and checkup  Vaccinations: Influenza vaccine: done 10/16/17 Pneumococcal vaccine: due  Tdap vaccine: Please contact us if you get a cut or scrape Shingles vaccine: Shingrix discussed. Please contact your pharmacy for coverage information.     Advanced directives: Please bring a copy of your health care power of attorney and living will to the office at your convenience.  Conditions/risks identified: Recommend drinking 6-8 glasses of water per day.   Next appointment: 06/04/2018 7:40 am Dr. Sanda Klein  Preventive Care 81 Years and Older, Male Preventive care refers to lifestyle choices and visits with your health care provider that can promote health and wellness. What does preventive care include?  A yearly physical exam. This is also called an annual well check.  Dental exams once or twice a year.  Routine eye exams. Ask your health care provider how often you should have your eyes checked.  Personal lifestyle choices, including:  Daily care of your teeth and gums.  Regular physical activity.  Eating a healthy diet.  Avoiding tobacco and drug use.  Limiting alcohol use.  Practicing safe sex.  Taking low doses of aspirin every day.  Taking vitamin and mineral supplements as recommended by your health care provider. What happens during an annual well check? The services and screenings done by your health care provider during your annual well check will depend on your age, overall health, lifestyle risk factors, and family history of disease. Counseling  Your health care provider  may ask you questions about your:  Alcohol use.  Tobacco use.  Drug use.  Emotional well-being.  Home and relationship well-being.  Sexual activity.  Eating habits.  History of falls.  Memory and ability to understand (cognition).  Work and work Statistician. Screening  You may have the following tests or measurements:  Height, weight, and BMI.  Blood pressure.  Lipid and cholesterol levels. These may be checked every 5 years, or more frequently if you are over 81 years old.  Skin check.  Lung cancer screening. You may have this screening every year starting at age 81 if you have a 30-pack-year history of smoking and currently smoke or have quit within the past 15 years.  Fecal occult blood test (FOBT) of the stool. You may have this test every year starting at age 81.  Flexible sigmoidoscopy or colonoscopy. You may have a sigmoidoscopy every 5 years or a colonoscopy every 10 years starting at age 81.  Prostate cancer screening. Recommendations will vary depending on your family history and other risks.  Hepatitis C blood test.  Hepatitis B blood test.  Sexually transmitted disease (STD) testing.  Diabetes screening. This is done by checking your blood sugar (glucose) after you have not eaten for a while (fasting). You may have this done every 1-3 years.  Abdominal aortic aneurysm (AAA) screening. You may need this if you are a current or former smoker.  Osteoporosis. You may be screened starting at age 81 if you are at high risk. Talk with your health care provider about your test results, treatment options, and if necessary, the need for more tests. Vaccines  Your health care provider may  recommend certain vaccines, such as:  Influenza vaccine. This is recommended every year.  Tetanus, diphtheria, and acellular pertussis (Tdap, Td) vaccine. You may need a Td booster every 10 years.  Zoster vaccine. You may need this after age 81.  Pneumococcal 13-valent  conjugate (PCV13) vaccine. One dose is recommended after age 81.  Pneumococcal polysaccharide (PPSV23) vaccine. One dose is recommended after age 81. Talk to your health care provider about which screenings and vaccines you need and how often you need them. This information is not intended to replace advice given to you by your health care provider. Make sure you discuss any questions you have with your health care provider. Document Released: 01/29/2015 Document Revised: 09/22/2015 Document Reviewed: 11/03/2014 Elsevier Interactive Patient Education  2017 [place] Prevention in the Home Falls can cause injuries. They can happen to people of all ages. There are many things you can do to make your home safe and to help prevent falls. What can I do on the outside of my home?  Regularly fix the edges of walkways and driveways and fix any cracks.  Remove anything that might make you trip as you walk through a door, such as a raised step or threshold.  Trim any bushes or trees on the path to your home.  Use bright outdoor lighting.  Clear any walking paths of anything that might make someone trip, such as rocks or tools.  Regularly check to see if handrails are loose or broken. Make sure that both sides of any steps have handrails.  Any raised decks and porches should have guardrails on the edges.  Have any leaves, snow, or ice cleared regularly.  Use sand or salt on walking paths during winter.  Clean up any spills in your garage right away. This includes oil or grease spills. What can I do in the bathroom?  Use night lights.  Install grab bars by the toilet and in the tub and shower. Do not use towel bars as grab bars.  Use non-skid mats or decals in the tub or shower.  If you need to sit down in the shower, use a plastic, non-slip stool.  Keep the floor dry. Clean up any water that spills on the floor as soon as it happens.  Remove soap buildup in the tub or  shower regularly.  Attach bath mats securely with double-sided non-slip rug tape.  Do not have throw rugs and other things on the floor that can make you trip. What can I do in the bedroom?  Use night lights.  Make sure that you have a light by your bed that is easy to reach.  Do not use any sheets or blankets that are too big for your bed. They should not hang down onto the floor.  Have a firm chair that has side arms. You can use this for support while you get dressed.  Do not have throw rugs and other things on the floor that can make you trip. What can I do in the kitchen?  Clean up any spills right away.  Avoid walking on wet floors.  Keep items that you use a lot in easy-to-reach places.  If you need to reach something above you, use a strong step stool that has a grab bar.  Keep electrical cords out of the way.  Do not use floor polish or wax that makes floors slippery. If you must use wax, use non-skid floor wax.  Do not have throw rugs and  other things on the floor that can make you trip. What can I do with my stairs?  Do not leave any items on the stairs.  Make sure that there are handrails on both sides of the stairs and use them. Fix handrails that are broken or loose. Make sure that handrails are as long as the stairways.  Check any carpeting to make sure that it is firmly attached to the stairs. Fix any carpet that is loose or worn.  Avoid having throw rugs at the top or bottom of the stairs. If you do have throw rugs, attach them to the floor with carpet tape.  Make sure that you have a light switch at the top of the stairs and the bottom of the stairs. If you do not have them, ask someone to add them for you. What else can I do to help prevent falls?  Wear shoes that:  Do not have high heels.  Have rubber bottoms.  Are comfortable and fit you well.  Are closed at the toe. Do not wear sandals.  If you use a stepladder:  Make sure that it is fully  opened. Do not climb a closed stepladder.  Make sure that both sides of the stepladder are locked into place.  Ask someone to hold it for you, if possible.  Clearly mark and make sure that you can see:  Any grab bars or handrails.  First and last steps.  Where the edge of each step is.  Use tools that help you move around (mobility aids) if they are needed. These include:  Canes.  Walkers.  Scooters.  Crutches.  Turn on the lights when you go into a dark area. Replace any light bulbs as soon as they burn out.  Set up your furniture so you have a clear path. Avoid moving your furniture around.  If any of your floors are uneven, fix them.  If there are any pets around you, be aware of where they are.  Review your medicines with your doctor. Some medicines can make you feel dizzy. This can increase your chance of falling. Ask your doctor what other things that you can do to help prevent falls. This information is not intended to replace advice given to you by your health care provider. Make sure you discuss any questions you have with your health care provider. Document Released: 10/29/2008 Document Revised: 06/10/2015 Document Reviewed: 02/06/2014 Elsevier Interactive Patient Education  2017 [employer].

## 2017-12-25 NOTE — Progress Notes (Signed)
Subjective:   Blake Rivera is a 83 y.o. male who presents for Medicare Annual/Subsequent preventive examination.  Review of Systems:   Cardiac Risk Factors include: advanced age (>57men, >56 women);hypertension;male gender;obesity (BMI >30kg/m2)     Objective:    Vitals: BP 128/72 (BP Location: Left Arm, Patient Position: Sitting, Cuff Size: Large)   Pulse 81   Temp 98.1 F (36.7 C) (Oral)   Resp 16   Ht 6' (1.829 m)   Wt 241 lb 9.6 oz (109.6 kg)   SpO2 98%   BMI 32.77 kg/m   Body mass index is 32.77 kg/m.  Advanced Directives 12/25/2017 12/22/2016 08/22/2016 07/03/2016 02/07/2016 02/25/2015 11/25/2014  Does Patient Have a Medical Advance Directive? Yes Yes No No No No Yes  Type of Paramedic of [place];Living will [place];Living will - - - - Living will  Does patient want to make changes to medical advance directive? - - - - - - -  Copy of [place] in Chart? No - copy requested No - copy requested - - - - No - copy requested  Would patient like information on creating a medical advance directive? - - - - - No - patient declined information No - patient declined information    Tobacco Social History   Tobacco Use  Smoking Status Former Smoker  . Packs/day: 0.25  . Years: 25.00  . Pack years: 6.25  . Types: Cigarettes  . Last attempt to quit: 05/18/2014  . Years since quitting: 3.6  Smokeless Tobacco Never Used  Tobacco Comment   Former smoker - no need to provide with smoking cessation materials     Counseling given: Not Answered Comment: Former smoker - no need to provide with smoking cessation materials   Clinical Intake:  Pre-visit preparation completed: Yes  Pain : No/denies pain     Nutritional Status: BMI > 30  Obese Diabetes: No  How often do you need to have someone help you when you read instructions, pamphlets, or other written materials from your doctor or pharmacy?: 1 - Never What  is the last grade level you completed in school?: PhD in Education  Interpreter Needed?: No  Information entered by :: Clemetine Marker LPN  Past Medical History:  Diagnosis Date  . Diverticulitis of colon   . ED (erectile dysfunction)   . Hyperglycemia   . Hypertension   . Vitamin D deficiency    Past Surgical History:  Procedure Laterality Date  . HERNIA REPAIR     Family History  Problem Relation Age of Onset  . Heart disease Mother   . Aneurysm Mother   . Heart disease Brother   . Emphysema Father   . Healthy Sister   . Healthy Brother   . Healthy Brother   . Heart disease Sister    Social History   Socioeconomic History  . Marital status: Married    Spouse name: Not on file  . Number of children: 2  . Years of education: Not on file  . Highest education level: Doctorate  Occupational History  . Occupation: Retired  Scientific laboratory technician  . Financial resource strain: Not hard at all  . Food insecurity:    Worry: Never true    Inability: Never true  . Transportation needs:    Medical: No    Non-medical: No  Tobacco Use  . Smoking status: Former Smoker    Packs/day: 0.25    Years: 25.00  Pack years: 6.25    Types: Cigarettes    Last attempt to quit: 05/18/2014    Years since quitting: 3.6  . Smokeless tobacco: Never Used  . Tobacco comment: Former smoker - no need to provide with smoking cessation materials  Substance and Sexual Activity  . Alcohol use: Yes    Alcohol/week: 1.0 standard drinks    Types: 1 Shots of liquor per week    Comment: moderate - 1 whiskey shot per day  . Drug use: No  . Sexual activity: Yes  Lifestyle  . Physical activity:    Days per week: 3 days    Minutes per session: Not on file  . Stress: Not at all  Relationships  . Social connections:    Talks on phone: More than three times a week    Gets together: Once a week    Attends religious service: More than 4 times per year    Active member of club or organization: Yes    Attends  meetings of clubs or organizations: More than 4 times per year    Relationship status: Married  Other Topics Concern  . Not on file  Social History Narrative  . Not on file    Outpatient Encounter Medications as of 12/25/2017  Medication Sig  . amLODipine (NORVASC) 10 MG tablet TAKE 1 TABLET BY MOUTH EVERY DAY  . aspirin 81 MG tablet Take 81 mg by mouth daily.  . cholecalciferol (VITAMIN D) 1000 units tablet Take 1,000 Units by mouth daily.  . Cod Liver Oil OIL Take 1 tablet by mouth daily.  . Multiple Vitamins-Minerals (CENTRUM SILVER PO) Take by mouth daily.  . Thiamine HCl (VITAMIN B-1) 250 MG tablet Take 250 mg by mouth daily.  . vitamin B-12 (CYANOCOBALAMIN) 1000 MCG tablet Take by mouth.   No facility-administered encounter medications on file as of 12/25/2017.     Activities of Daily Living In your present state of health, do you have any difficulty performing the following activities: 12/25/2017 12/03/2017  Hearing? N N  Comment declines hearing aids -  Vision? N N  Comment wears reading glasses -  Difficulty concentrating or making decisions? N N  Walking or climbing stairs? N N  Comment - -  Dressing or bathing? N N  Doing errands, shopping? N N  Preparing Food and eating ? N -  Using the Toilet? N -  In the past six months, have you accidently leaked urine? N -  Do you have problems with loss of bowel control? N -  Managing your Medications? N -  Managing your Finances? N -  Housekeeping or managing your Housekeeping? N -  Some recent data might be hidden    Patient Care Team: Lada, Satira Anis, MD as PCP - General (Family Medicine)   Assessment:   This is a routine wellness examination for [place].  Exercise Activities and Dietary recommendations Current Exercise Habits: Home exercise routine, Type of exercise: Other - see comments(plays golf 3 days a week), Frequency (Times/Week): 3, Intensity: Mild, Exercise limited by: None identified  Goals   None      Fall Risk Fall Risk  12/25/2017 12/03/2017 05/21/2017 02/12/2017 12/22/2016  Falls in the past year? 0 0 Yes No No  Number falls in past yr: 0 - 1 - -  Injury with Fall? - - No - -  Risk for fall due to : - - - - -  Risk for fall due to: Comment - - - - -  Follow up - - - - -   FALL RISK PREVENTION PERTAINING TO THE HOME:  Any stairs in or around the home WITH handrails? Yes  Home free of loose throw rugs in walkways, pet beds, electrical cords, etc? Yes  Adequate lighting in your home to reduce risk of falls? Yes   ASSISTIVE DEVICES UTILIZED TO PREVENT FALLS:  Life alert? No  Use of a cane, walker or w/c? No  Grab bars in the bathroom? Yes  Shower chair or bench in shower? No  Elevated toilet seat or a handicapped toilet? Yes   DME ORDERS:  DME order needed?  No   TIMED UP AND GO:  Was the test performed? Yes .  Length of time to ambulate 10 feet: 6 sec.   GAIT:  Appearance of gait: Gait stead-fast and without the use of an assistive device. .  Education: Fall risk prevention has been discussed.  Intervention(s) required? No   Depression Screen PHQ 2/9 Scores 12/25/2017 12/03/2017 05/21/2017 02/12/2017  PHQ - 2 Score 0 0 0 0  PHQ- 9 Score 0 0 - -    Cognitive Function     6CIT Screen 12/25/2017 12/22/2016  What Year? 0 points 0 points  What month? 0 points 0 points  What time? 0 points 0 points  Count back from 20 0 points 0 points  Months in reverse 0 points 0 points  Repeat phrase 2 points 0 points  Total Score 2 0    Immunization History  Administered Date(s) Administered  . Influenza Split 12/02/2013  . Influenza, High Dose Seasonal PF 10/24/2016, 10/16/2017  . Influenza, Seasonal, Injecte, Preservative Fre 11/11/2012  . Influenza-Unspecified 12/06/2014, 10/25/2015    Qualifies for Shingles Vaccine? Yes  Zostavax completed per pt, unsure date. Due for Shingrix. Education has been provided regarding the importance of this vaccine. Pt has been advised  to call insurance company to determine out of pocket expense. Advised may also receive vaccine at local pharmacy or Health Dept. Verbalized acceptance and understanding.  Tdap: Up to date   Flu Vaccine: Up to date   Pneumococcal Vaccine: Due for Pneumococcal vaccine. Does the patient want to receive this vaccine today?  No . Education has been provided regarding the importance of this vaccine but still declined. Advised may receive this vaccine at local pharmacy or Health Dept. Aware to provide a copy of the vaccination record if obtained from local pharmacy or Health Dept. Verbalized acceptance and understanding.  Pt declined this vaccine today due to receiving cortisone shots in both arms yesterday. Pt aware to receive at pharmacy or nurse visit.   Screening Tests Health Maintenance  Topic Date Due  . TETANUS/TDAP  01/31/1953  . PNA vac Low Risk Adult (1 of 2 - PCV13) 05/17/2018 (Originally 01/30/2001)  . INFLUENZA VACCINE  Completed   Cancer Screenings:  Colorectal Screening: Completed 2015. No longer required.   Lung Cancer Screening: (Low Dose CT Chest recommended if Age 63-80 years, 30 pack-year currently smoking OR have quit w/in 15years.) does not qualify.   Additional Screening:  Hepatitis C Screening: no longer required  Vision Screening: Recommended annual ophthalmology exams for early detection of glaucoma and other disorders of the eye. Is the patient up to date with their annual eye exam?  No  Who is the provider or what is the name of the office in which the pt attends annual eye exams? [facility]  Dental Screening: Recommended annual dental exams for proper oral hygiene  Community Resource Referral:  CRR required this visit?  No       Plan:    I have personally reviewed and addressed the Medicare Annual Wellness questionnaire and have noted the following in the patient's chart:  A. Medical and social history B. Use of alcohol, tobacco or illicit drugs   C. Current medications and supplements D. Functional ability and status E.  Nutritional status F.  Physical activity G. Advance directives H. List of other physicians I.  Hospitalizations, surgeries, and ER visits in previous 12 months J.  [place] such as hearing and vision if needed, cognitive and depression L. Referrals and appointments   In addition, I have reviewed and discussed with patient certain preventive protocols, quality metrics, and best practice recommendations. A written personalized care plan for preventive services as well as general preventive health recommendations were provided to patient.   Signed,  Clemetine Marker, LPN Nurse Health Advisor   Nurse Notes: none

## 2018-01-18 ENCOUNTER — Other Ambulatory Visit: Payer: Self-pay | Admitting: Student

## 2018-01-18 DIAGNOSIS — M7582 Other shoulder lesions, left shoulder: Secondary | ICD-10-CM

## 2018-01-22 ENCOUNTER — Ambulatory Visit
Admission: RE | Admit: 2018-01-22 | Discharge: 2018-01-22 | Disposition: A | Discharge status: Home / Self Care | Payer: Medicare Other | Source: Ambulatory Visit | Attending: Student | Admitting: Student

## 2018-01-22 DIAGNOSIS — M7582 Other shoulder lesions, left shoulder: Secondary | ICD-10-CM | POA: Diagnosis not present

## 2018-02-08 DIAGNOSIS — M7582 Other shoulder lesions, left shoulder: Secondary | ICD-10-CM | POA: Insufficient documentation

## 2018-02-08 DIAGNOSIS — S46012A Strain of muscle(s) and tendon(s) of the rotator cuff of left shoulder, initial encounter: Secondary | ICD-10-CM | POA: Insufficient documentation

## 2018-03-17 HISTORY — PX: ROTATOR CUFF REPAIR: SHX139

## 2018-05-17 ENCOUNTER — Other Ambulatory Visit: Payer: Self-pay | Admitting: Nurse Practitioner

## 2018-05-17 DIAGNOSIS — I10 Essential (primary) hypertension: Secondary | ICD-10-CM

## 2018-05-17 NOTE — Telephone Encounter (Signed)
done

## 2018-05-17 NOTE — Telephone Encounter (Signed)
Please schedule routine appointment in the next 2-4 weeks

## 2018-06-04 ENCOUNTER — Ambulatory Visit: Payer: Medicare Other | Admitting: Family Medicine

## 2018-06-07 ENCOUNTER — Ambulatory Visit: Payer: Medicare Other | Admitting: Nurse Practitioner

## 2018-06-11 ENCOUNTER — Ambulatory Visit: Payer: Medicare Other | Admitting: Nurse Practitioner

## 2018-10-24 ENCOUNTER — Ambulatory Visit: Payer: Medicare Other

## 2018-10-30 ENCOUNTER — Encounter: Payer: Self-pay | Admitting: Family Medicine

## 2018-10-30 ENCOUNTER — Ambulatory Visit (INDEPENDENT_AMBULATORY_CARE_PROVIDER_SITE_OTHER): Payer: Medicare Other | Admitting: Family Medicine

## 2018-10-30 ENCOUNTER — Other Ambulatory Visit: Payer: Self-pay

## 2018-10-30 VITALS — BP 128/80 | HR 67 | Temp 97.6°F | Resp 14 | Ht 72.0 in | Wt 236.7 lb

## 2018-10-30 DIAGNOSIS — Z5181 Encounter for therapeutic drug level monitoring: Secondary | ICD-10-CM

## 2018-10-30 DIAGNOSIS — R399 Unspecified symptoms and signs involving the genitourinary system: Secondary | ICD-10-CM | POA: Diagnosis not present

## 2018-10-30 DIAGNOSIS — Z23 Encounter for immunization: Secondary | ICD-10-CM

## 2018-10-30 DIAGNOSIS — R351 Nocturia: Secondary | ICD-10-CM

## 2018-10-30 DIAGNOSIS — Z9889 Other specified postprocedural states: Secondary | ICD-10-CM

## 2018-10-30 DIAGNOSIS — R1031 Right lower quadrant pain: Secondary | ICD-10-CM

## 2018-10-30 DIAGNOSIS — I10 Essential (primary) hypertension: Secondary | ICD-10-CM | POA: Diagnosis not present

## 2018-10-30 DIAGNOSIS — E785 Hyperlipidemia, unspecified: Secondary | ICD-10-CM | POA: Diagnosis not present

## 2018-10-30 DIAGNOSIS — Z8719 Personal history of other diseases of the digestive system: Secondary | ICD-10-CM

## 2018-10-30 MED ORDER — TAMSULOSIN HCL 0.4 MG PO CAPS: 0.4000 mg | ORAL_CAPSULE | Freq: Every day | ORAL | 3 refills | Status: DC

## 2018-10-30 NOTE — Patient Instructions (Signed)
Urinary Frequency, Adult Urinary frequency means urinating more often than usual. You may urinate every 1-2 hours even though you drink a normal amount of fluid and do not have a bladder infection or condition. Although you urinate more often than normal, the total amount of urine produced in a day is normal. With urinary frequency, you may have an urgent need to urinate often. The stress and anxiety of needing to find a bathroom quickly can make this urge worse. This condition may go away on its own or you may need treatment at home. Home treatment may include bladder training, exercises, taking medicines, or making changes to your diet. Follow these instructions at home: Bladder health   Keep a bladder diary if told by your health care provider. Keep track of: ? What you eat and drink. ? How often you urinate. ? How much you urinate.  Follow a bladder training program if told by your health care provider. This may include: ? Learning to delay going to the bathroom. ? Double urinating (voiding). This helps if you are not completely emptying your bladder. ? Scheduled voiding.  Do Kegel exercises as told by your health care provider. Kegel exercises strengthen the muscles that help control urination, which may help the condition. Eating and drinking  If told by your health care provider, make diet changes, such as: ? Avoiding caffeine. ? Drinking fewer fluids, especially alcohol. ? Not drinking in the evening. ? Avoiding foods or drinks that may irritate the bladder. These include coffee, tea, soda, artificial sweeteners, citrus, tomato-based foods, and chocolate. ? Eating foods that help prevent or ease constipation. Constipation can make this condition worse. Your health care provider may recommend that you:  Drink enough fluid to keep your urine pale yellow.  Take over-the-counter or prescription medicines.  Eat foods that are high in fiber, such as beans, whole grains, and fresh  fruits and vegetables.  Limit foods that are high in fat and processed sugars, such as fried or sweet foods. General instructions  Take over-the-counter and prescription medicines only as told by your health care provider.  Keep all follow-up visits as told by your health care provider. This is important. Contact a health care provider if:  You start urinating more often.  You feel pain or irritation when you urinate.  You notice blood in your urine.  Your urine looks cloudy.  You develop a fever.  You begin vomiting. Get help right away if:  You are unable to urinate. Summary  Urinary frequency means urinating more often than usual. With urinary frequency, you may urinate every 1-2 hours even though you drink a normal amount of fluid and do not have a bladder infection or other bladder condition.  Your health care provider may recommend that you keep a bladder diary, follow a bladder training program, or make dietary changes.  If told by your health care provider, do Kegel exercises to strengthen the muscles that help control urination.  Take over-the-counter and prescription medicines only as told by your health care provider.  Contact a health care provider if your symptoms do not improve or get worse. This information is not intended to replace advice given to you by your health care provider. Make sure you discuss any questions you have with your health care provider. Document Released: 10/29/2008 Document Revised: 07/12/2017 Document Reviewed: 07/12/2017 Elsevier Patient Education  2020 [employer]  

## 2018-10-30 NOTE — Progress Notes (Signed)
Name: Blake Rivera   MRN: [id number]    DOB: Jun 07, 1934   Date:10/30/2018       Progress Note  Chief Complaint  Patient presents with  . Follow-up    wants labs  . Nocturia    would like PSA tested  . Groin Pain    right side hx of hernia repair     Subjective:   Blake Rivera is a 84 y.o. male, presents to clinic for routine follow up on the conditions listed above.    For the past couple months change to urinary sx, more frequency and nocturia with weaker stream.  No dysuria, hematuria, some back pain and suprapubic pain - a pressure and ache to bladder area and sore to low back Weight loss - a little weight loss, no polyphagia, polydipsia, fatigue, weakness Wt Readings from Last 5 Encounters:  10/30/18 236 lb 11.2 oz (107.4 kg)  12/25/17 241 lb 9.6 oz (109.6 kg)  12/03/17 240 lb 4.8 oz (109 kg)  05/21/17 241 lb 1.6 oz (109.4 kg)  02/12/17 243 lb 6.4 oz (110.4 kg)   BMI Readings from Last 5 Encounters:  10/30/18 32.10 kg/m  12/25/17 32.77 kg/m  12/03/17 32.59 kg/m  05/21/17 32.70 kg/m  02/12/17 33.01 kg/m   Urinary frequency -  IPSS Questionnaire (AUA-7): Over the past month.   1)  How often have you had a sensation of not emptying your bladder completely after you finish urinating?  1 - Less than 1 time in 5  2)  How often have you had to urinate again less than two hours after you finished urinating? 0 - Not at all  3)  How often have you found you stopped and started again several times when you urinated?  0 - Not at all  4) How difficult have you found it to postpone urination?  0 - Not at all  5) How often have you had a weak urinary stream?  3 - About half the time  6) How often have you had to push or strain to begin urination?  2 - Less than half the time  7) How many times did you most typically get up to urinate from the time you went to bed until the time you got up in the morning?  3 - 3 times  Total score:  0-7 mildly symptomatic   8-19  moderately symptomatic   20-35 severely symptomatic   No BPH in the past, no LUTS or meds in the past Lab Results  Component Value Date   PSA 1.1 07/03/2016     Hypertension:  Currently managed on norvasc 10 mg Pt reports excellent med compliance and denies any SE.   No lightheadedness, hypotension, syncope. Blood pressure today is well controlled. BP Readings from Last 3 Encounters:  10/30/18 128/80  12/25/17 128/72  12/03/17 120/74   Pt denies CP, SOB, exertional sx, LE edema, palpitation, Ha's, visual disturbances Dietary efforts for BP?  Healthy, lower salt    Hyperlipidemia:  Current Medication Regimen:  No statin only oil supplement Last Lipids: Lab Results  Component Value Date   CHOL 182 05/21/2017   HDL 47 05/21/2017   LDLCALC 117 (H) 05/21/2017   TRIG 83 05/21/2017   CHOLHDL 3.9 05/21/2017  Patient had allergy to simvastatin and omega-3 fatty acid supplement  Patient also complains of pain in his bilateral groins with history over 25 years ago of right inguinal hernia repair he feels no bulge he denies any  swelling to his pubic area or scrotum.    Patient Active Problem List   Diagnosis Date Noted  . Localized, primary osteoarthritis 12/25/2017  . Lipoma of scalp 12/03/2017  . Obesity (BMI 30.0-34.9) 12/03/2017  . CKD (chronic kidney disease) stage 2, GFR 60-89 ml/min 12/03/2017  . Productive cough 02/07/2016  . Dyslipidemia 11/25/2014  . Rising PSA level 11/25/2014  . Annual physical exam 08/26/2014  . Abnormal ECG 07/16/2014  . History of colitis 07/16/2014  . ED (erectile dysfunction) 07/16/2014  . Anterior knee pain 07/16/2014  . Arthralgia of shoulder 07/16/2014  . Disturbance of skin sensation 07/16/2014  . Avitaminosis D 07/16/2014  . Dyspnea 12/29/2013  . HTN (hypertension) 12/29/2013    Past Surgical History:  Procedure Laterality Date  . HERNIA REPAIR    . ROTATOR CUFF REPAIR  03/17/2018    Family History  Problem Relation Age of  Onset  . Heart disease Mother   . Aneurysm Mother   . Heart disease Brother   . Emphysema Father   . Healthy Sister   . Healthy Brother   . Healthy Brother   . Heart disease Sister     Social History   Socioeconomic History  . Marital status: Married    Spouse name: Not on file  . Number of children: 2  . Years of education: Not on file  . Highest education level: Doctorate  Occupational History  . Occupation: Retired  Scientific laboratory technician  . Financial resource strain: Not hard at all  . Food insecurity    Worry: Never true    Inability: Never true  . Transportation needs    Medical: No    Non-medical: No  Tobacco Use  . Smoking status: Former Smoker    Packs/day: 0.25    Years: 25.00    Pack years: 6.25    Types: Cigarettes    Quit date: 05/18/2014    Years since quitting: 4.4  . Smokeless tobacco: Never Used  . Tobacco comment: Former smoker - no need to provide with smoking cessation materials  Substance and Sexual Activity  . Alcohol use: Yes    Alcohol/week: 1.0 standard drinks    Types: 1 Shots of liquor per week    Comment: moderate - 1 whiskey shot per day  . Drug use: No  . Sexual activity: Yes  Lifestyle  . Physical activity    Days per week: 3 days    Minutes per session: Not on file  . Stress: Not at all  Relationships  . Social connections    Talks on phone: More than three times a week    Gets together: Once a week    Attends religious service: More than 4 times per year    Active member of club or organization: Yes    Attends meetings of clubs or organizations: More than 4 times per year    Relationship status: Married  . Intimate partner violence    Fear of current or ex partner: No    Emotionally abused: No    Physically abused: No    Forced sexual activity: No  Other Topics Concern  . Not on file  Social History Narrative  . Not on file     Current Outpatient Medications:  .  amLODipine (NORVASC) 10 MG tablet, TAKE 1 TABLET BY MOUTH  EVERY DAY, Disp: 90 tablet, Rfl: 1 .  cholecalciferol (VITAMIN D) 1000 units tablet, Take 1,000 Units by mouth daily., Disp: , Rfl:  .  [place]ardo  Liver Oil OIL, Take 1 tablet by mouth daily., Disp: , Rfl:  .  Multiple Vitamins-Minerals (CENTRUM SILVER PO), Take by mouth daily., Disp: , Rfl:  .  Thiamine HCl (VITAMIN B-1) 250 MG tablet, Take 250 mg by mouth daily., Disp: , Rfl:  .  vitamin B-12 (CYANOCOBALAMIN) 1000 MCG tablet, Take by mouth., Disp: , Rfl:  .  aspirin 81 MG tablet, Take 81 mg by mouth daily., Disp: , Rfl:   Allergies  Allergen Reactions  . Lisinopril     Angioedema  . [place]or  [Omega-3-Acid Ethyl Esters]     Other reaction(s): Rash  . Zocor  [Simvastatin]     Muscle Pain    I personally reviewed active problem list, medication list, allergies, family history, social history, health maintenance, notes from last encounter, lab results, imaging with the patient/caregiver today.  Review of Systems  Constitutional: Negative.  Negative for chills, diaphoresis, fatigue and fever.  HENT: Negative.   Eyes: Negative.   Respiratory: Negative.   Cardiovascular: Negative.   Gastrointestinal: Negative.  Negative for blood in stool, constipation, diarrhea, nausea, rectal pain and vomiting.  Endocrine: Negative.   Genitourinary: Negative.   Musculoskeletal: Negative.   Skin: Negative.   Allergic/Immunologic: Negative.   Neurological: Negative.   Hematological: Negative.   Psychiatric/Behavioral: Negative.   All other systems reviewed and are negative.    Objective:    Vitals:   10/30/18 1004  BP: 128/80  Pulse: 67  Resp: 14  Temp: 97.6 F (36.4 C)  SpO2: 93%  Weight: 236 lb 11.2 oz (107.4 kg)  Height: 6' (1.829 m)    Body mass index is 32.1 kg/m.  Physical Exam Vitals signs and nursing note reviewed.  Constitutional:      General: He is not in acute distress.    Appearance: Normal appearance. He is well-developed. He is not ill-appearing, toxic-appearing or  diaphoretic.     Interventions: Face mask in place.  HENT:     Head: Normocephalic and atraumatic.     Jaw: No trismus.     Right Ear: External ear normal.     Left Ear: External ear normal.  Eyes:     General: Lids are normal. No scleral icterus.    Conjunctiva/sclera: Conjunctivae normal.     Pupils: Pupils are equal, round, and reactive to light.  Neck:     Musculoskeletal: Normal range of motion and neck supple.     Trachea: Trachea and phonation normal. No tracheal deviation.  Cardiovascular:     Rate and Rhythm: Normal rate and regular rhythm.     Pulses: Normal pulses.          Radial pulses are 2+ on the right side and 2+ on the left side.       Posterior tibial pulses are 2+ on the right side and 2+ on the left side.     Heart sounds: Normal heart sounds. No murmur. No friction rub. No gallop.   Pulmonary:     Effort: Pulmonary effort is normal. No respiratory distress.     Breath sounds: Normal breath sounds. No stridor. No wheezing, rhonchi or rales.  Abdominal:     General: Abdomen is protuberant. Bowel sounds are normal. There is no distension or abdominal bruit.     Palpations: Abdomen is soft. There is no splenomegaly or pulsatile mass.     Tenderness: There is no abdominal tenderness. There is no right CVA tenderness, left CVA tenderness, guarding or rebound.  Hernia: A hernia is present. Hernia is present in the umbilical area and ventral area.     Comments: Did not palpate right or left inguinal hernia but he has tender to bilateral inguinal canal No suprapubic tenderness to palpation, no guarding no rebound  Musculoskeletal: Normal range of motion.     Right lower leg: No edema.     Left lower leg: No edema.  Skin:    General: Skin is warm and dry.     Capillary Refill: Capillary refill takes less than 2 seconds.     Coloration: Skin is not jaundiced.     Findings: No rash.     Nails: There is no clubbing.   Neurological:     Mental Status: He is alert.      Cranial Nerves: No dysarthria or facial asymmetry.     Motor: No tremor or abnormal muscle tone.     Gait: Gait normal.  Psychiatric:        Mood and Affect: Mood normal.        Speech: Speech normal.        Behavior: Behavior normal. Behavior is cooperative.        PHQ2/9: Depression screen [facility] 2/9 10/30/2018 12/25/2017 12/03/2017 05/21/2017 02/12/2017  Decreased Interest 0 0 0 0 0  Down, Depressed, Hopeless 0 0 0 0 0  PHQ - 2 Score 0 0 0 0 0  Altered sleeping 0 0 0 - -  Tired, decreased energy 0 0 0 - -  Change in appetite 0 0 0 - -  Feeling bad or failure about yourself  0 0 0 - -  Trouble concentrating 0 0 0 - -  Moving slowly or fidgety/restless 0 - - - -  Suicidal thoughts 0 0 0 - -  PHQ-9 Score 0 0 0 - -  Difficult doing work/chores Not difficult at all - - - -    phq 9 is negative   Fall Risk: Fall Risk  10/30/2018 12/25/2017 12/03/2017 05/21/2017 02/12/2017  Falls in the past year? 0 0 0 Yes No  Number falls in past yr: 0 0 - 1 -  Injury with Fall? 0 - - No -  Risk for fall due to : - - - - -  Risk for fall due to: Comment - - - - -  Follow up - - - - -      Functional Status Survey: Is the patient deaf or have difficulty hearing?: No Does the patient have difficulty seeing, even when wearing glasses/contacts?: No Does the patient have difficulty concentrating, remembering, or making decisions?: No Does the patient have difficulty walking or climbing stairs?: No Does the patient have difficulty dressing or bathing?: No Does the patient have difficulty doing errands alone such as visiting a doctor's office or shopping?: No    Assessment & Plan:       ICD-10-CM   1. Lower urinary tract symptoms (LUTS)  R39.9 tamsulosin (FLOMAX) 0.4 MG CAPS capsule    PSA   Trial of Flomax, PSA per patient request, likely have him follow-up with urology to assess prostate/BPH  2. Nocturia  R35.1 tamsulosin (FLOMAX) 0.4 MG CAPS capsule    Hemoglobin A1c    Urine Culture     Urinalysis, Routine w reflex microscopic    PSA   Rule out diabetes, suspect BPH with LUTS  3. Essential hypertension  99991111 COMPLETE METABOLIC PANEL WITH GFR   Well-controlled  4. Dyslipidemia  99991111 COMPLETE METABOLIC PANEL WITH  GFR    Lipid panel   Some limitations of management with past allergies but considering his age would not likely be extremely aggressive, recheck labs  5. Right groin pain  R10.31    hx of right inguinal hernia repair 25+ yrs ago, right groin pain for 1 week -tenderness on exam to bilateral inguinal canal, refer to surgery for eval and w/up Appears to have an small umbilical hernia possibly rectus diastases versus a ventral hernia -with his exam has history of right inguinal hernia repair and with his current symptoms will refer to specialist to determine required imaging etc.  6. Need for influenza vaccination  Z23 Flu Vaccine QUAD High Dose(Fluad)  7. Encounter for medication monitoring  Z51.81 tamsulosin (FLOMAX) 0.4 MG CAPS capsule    CBC with Differential/Platelet    COMPLETE METABOLIC PANEL WITH GFR    Lipid panel    Hemoglobin A1c    Urine Culture    Urinalysis, Routine w reflex microscopic     No follow-ups on file.   Delsa Grana, PA-C 10/30/18 10:29 AM

## 2018-11-01 LAB — COMPLETE METABOLIC PANEL WITH GFR
AG Ratio: 1.8 (calc) (ref 1.0–2.5)
ALT: 11 U/L (ref 9–46)
AST: 13 U/L (ref 10–35)
Albumin: 4.4 g/dL (ref 3.6–5.1)
Alkaline phosphatase (APISO): 69 U/L (ref 35–144)
BUN: 10 mg/dL (ref 7–25)
CO2: 29 mmol/L (ref 20–32)
Calcium: 9.3 mg/dL (ref 8.6–10.3)
Chloride: 106 mmol/L (ref 98–110)
Creat: 1.01 mg/dL (ref 0.70–1.11)
GFR, Est African American: 80 mL/min/{1.73_m2} (ref >=60)
GFR, Est Non African American: 69 mL/min/{1.73_m2} (ref >=60)
Globulin: 2.4 g/dL (calc) (ref 1.9–3.7)
Glucose, Bld: 111 mg/dL — ABNORMAL HIGH (ref 65–99)
Potassium: 4.6 mmol/L (ref 3.5–5.3)
Sodium: 143 mmol/L (ref 135–146)
Total Bilirubin: 0.5 mg/dL (ref 0.2–1.2)
Total Protein: 6.8 g/dL (ref 6.1–8.1)

## 2018-11-01 LAB — CBC WITH DIFFERENTIAL/PLATELET
Absolute Monocytes: 466 cells/uL (ref 200–950)
Basophils Absolute: 21 cells/uL (ref 0–200)
Basophils Relative: 0.4 %
Eosinophils Absolute: 69 cells/uL (ref 15–500)
Eosinophils Relative: 1.3 %
HCT: 47.6 % (ref 38.5–50.0)
Hemoglobin: 15.4 g/dL (ref 13.2–17.1)
Lymphs Abs: 2109 cells/uL (ref 850–3900)
MCH: 28.6 pg (ref 27.0–33.0)
MCHC: 32.4 g/dL (ref 32.0–36.0)
MCV: 88.5 fL (ref 80.0–100.0)
MPV: 9.4 fL (ref 7.5–12.5)
Monocytes Relative: 8.8 %
Neutro Abs: 2634 cells/uL (ref 1500–7800)
Neutrophils Relative %: 49.7 %
Platelets: 218 10*3/uL (ref 140–400)
RBC: 5.38 10*6/uL (ref 4.20–5.80)
RDW: 13.5 % (ref 11.0–15.0)
Total Lymphocyte: 39.8 %
WBC: 5.3 10*3/uL (ref 3.8–10.8)

## 2018-11-01 LAB — URINALYSIS, ROUTINE W REFLEX MICROSCOPIC
Bilirubin Urine: NEGATIVE
Glucose, UA: NEGATIVE
Hgb urine dipstick: NEGATIVE
Ketones, ur: NEGATIVE
Leukocytes,Ua: NEGATIVE
Nitrite: NEGATIVE
Protein, ur: NEGATIVE
Specific Gravity, Urine: 1.02 (ref 1.001–1.03)
pH: <=5 (ref 5.0–8.0)

## 2018-11-01 LAB — LIPID PANEL
Cholesterol: 190 mg/dL (ref <200)
HDL: 50 mg/dL (ref >=40)
LDL Cholesterol (Calc): 124 mg/dL (calc) — ABNORMAL HIGH
Non-HDL Cholesterol (Calc): 140 mg/dL (calc) — ABNORMAL HIGH (ref <130)
Total CHOL/HDL Ratio: 3.8 (calc) (ref <5.0)
Triglycerides: 72 mg/dL (ref <150)

## 2018-11-01 LAB — URINE CULTURE
MICRO NUMBER:: 989741
Result:: NO GROWTH
SPECIMEN QUALITY:: ADEQUATE

## 2018-11-01 LAB — HEMOGLOBIN A1C
Hgb A1c MFr Bld: 6.2 % of total Hgb — ABNORMAL HIGH (ref <5.7)
Mean Plasma Glucose: 131 (calc)
eAG (mmol/L): 7.3 (calc)

## 2018-11-01 LAB — PSA: PSA: 1.1 ng/mL (ref <=4.0)

## 2018-11-13 ENCOUNTER — Other Ambulatory Visit: Payer: Self-pay

## 2018-11-13 ENCOUNTER — Ambulatory Visit: Payer: Medicare Other | Admitting: Surgery

## 2018-11-13 ENCOUNTER — Encounter: Payer: Self-pay | Admitting: Surgery

## 2018-11-13 VITALS — BP 115/79 | HR 68 | Temp 97.2°F | Resp 12 | Ht 72.0 in | Wt 233.2 lb

## 2018-11-13 DIAGNOSIS — R1031 Right lower quadrant pain: Secondary | ICD-10-CM | POA: Diagnosis not present

## 2018-11-13 NOTE — Patient Instructions (Addendum)
Please call if you have questions or concerns.  Please call and let us know if you want to proceed with the CT scan abdomen and pelvis and we will get this ordered for you.    guinal Hernia, Adult An inguinal hernia is when fat or your intestines push through a weak spot in a muscle where your leg meets your lower belly (groin). This causes a rounded lump (bulge). This kind of hernia could also be:  In your scrotum, if you are male.  In folds of skin around your vagina, if you are male. There are three types of inguinal hernias. These include:  Hernias that can be pushed back into the belly (are reducible). This type rarely causes pain.  Hernias that cannot be pushed back into the belly (are incarcerated).  Hernias that cannot be pushed back into the belly and lose their blood supply (are strangulated). This type needs emergency surgery. If you do not have symptoms, you may not need treatment. If you have symptoms or a large hernia, you may need surgery. Follow these instructions at home: Lifestyle  Do these things if told by your doctor so you do not have trouble pooping (constipation): ? Drink enough fluid to keep your pee (urine) pale yellow. ? Eat foods that have a lot of fiber. These include fresh fruits and vegetables, whole grains, and beans. ? Limit foods that are high in fat and processed sugars. These include foods that are fried or sweet. ? Take medicine for trouble pooping.  Avoid lifting heavy objects.  Avoid standing for long amounts of time.  Do not use any products that contain nicotine or tobacco. These include cigarettes and e-cigarettes. If you need help quitting, ask your doctor.  Stay at a healthy weight. General instructions  You may try to push your hernia in by very gently pressing on it when you are lying down. Do not try to force the bulge back in if it will not push in easily.  Watch your hernia for any changes in shape, size, or color. Tell your  doctor if you see any changes.  Take over-the-counter and prescription medicines only as told by your doctor.  Keep all follow-up visits as told by your doctor. This is important. Contact a doctor if:  You have a fever.  You have new symptoms.  Your symptoms get worse. Get help right away if:  The area where your leg meets your lower belly has: ? Pain that gets worse suddenly. ? A bulge that gets bigger suddenly, and it does not get smaller after that. ? A bulge that turns red or purple. ? A bulge that is painful when you touch it.  You are a man, and your scrotum: ? Suddenly feels painful. ? Suddenly changes in size.  You cannot push the hernia in by very gently pressing on it when you are lying down. Do not try to force the bulge back in if it will not push in easily.  You feel sick to your stomach (nauseous), and that feeling does not go away.  You throw up (vomit), and that keeps happening.  You have a fast heartbeat.  You cannot poop (have a bowel movement) or pass gas. These symptoms may be an emergency. Do not wait to see if the symptoms will go away. Get medical help right away. Call your local emergency services (911 in the [place] Summary  An inguinal hernia is when fat or your intestines push through a weak spot  in a muscle where your leg meets your lower belly (groin). This causes a rounded lump (bulge).  If you do not have symptoms, you may not need treatment. If you have symptoms or a large hernia, you may need surgery.  Avoid lifting heavy objects. Also avoid standing for long amounts of time.  Do not try to force the bulge back in if it will not push in easily. This information is not intended to replace advice given to you by your health care provider. Make sure you discuss any questions you have with your health care provider. Document Released: 02/02/2006 Document Revised: 02/03/2017 Document Reviewed: 10/04/2016 Elsevier Patient Education  [place].      Umbilical Hernia, Adult  A hernia is a bulge of tissue that pushes through an opening between muscles. An umbilical hernia happens in the abdomen, near the belly button (umbilicus). The hernia may contain tissues from the small intestine, large intestine, or fatty tissue covering the intestines (omentum). Umbilical hernias in adults tend to get worse over time, and they require surgical treatment. There are several types of umbilical hernias. You may have:  A hernia located just above or below the umbilicus (indirect hernia). This is the most common type of umbilical hernia in adults.  A hernia that forms through an opening formed by the umbilicus (direct hernia).  A hernia that comes and goes (reducible hernia). A reducible hernia may be visible only when you strain, lift something heavy, or cough. This type of hernia can be pushed back into the abdomen (reduced).  A hernia that traps abdominal tissue inside the hernia (incarcerated hernia). This type of hernia cannot be reduced.  A hernia that cuts off blood flow to the tissues inside the hernia (strangulated hernia). The tissues can start to die if this happens. This type of hernia requires emergency treatment. What are the causes? An umbilical hernia happens when tissue inside the abdomen presses on a weak area of the abdominal muscles. What increases the risk? You may have a greater risk of this condition if you:  Are obese.  Have had several pregnancies.  Have a buildup of fluid inside your abdomen (ascites).  Have had surgery that weakens the abdominal muscles. What are the signs or symptoms? The main symptom of this condition is a painless bulge at or near the belly button. A reducible hernia may be visible only when you strain, lift something heavy, or cough. Other symptoms may include:  Dull pain.  A feeling of pressure. Symptoms of a strangulated hernia may include:  Pain that gets increasingly  worse.  Nausea and vomiting.  Pain when pressing on the hernia.  Skin over the hernia becoming red or purple.  Constipation.  Blood in the stool. How is this diagnosed? This condition may be diagnosed based on:  A physical exam. You may be asked to cough or strain while standing. These actions increase the pressure inside your abdomen and force the hernia through the opening in your muscles. Your health care provider may try to reduce the hernia by pressing on it.  Your symptoms and medical history. How is this treated? Surgery is the only treatment for an umbilical hernia. Surgery for a strangulated hernia is done as soon as possible. If you have a small hernia that is not incarcerated, you may need to lose weight before having surgery. Follow these instructions at home:  Lose weight, if told by your health care provider.  Do not try to push  the hernia back in.  Watch your hernia for any changes in color or size. Tell your health care provider if any changes occur.  You may need to avoid activities that increase pressure on your hernia.  Do not lift anything that is heavier than 10 lb (4.5 kg) until your health care provider says that this is safe.  Take over-the-counter and prescription medicines only as told by your health care provider.  Keep all follow-up visits as told by your health care provider. This is important. Contact a health care provider if:  Your hernia gets larger.  Your hernia becomes painful. Get help right away if:  You develop sudden, severe pain near the area of your hernia.  You have pain as well as nausea or vomiting.  You have pain and the skin over your hernia changes color.  You develop a fever. This information is not intended to replace advice given to you by your health care provider. Make sure you discuss any questions you have with your health care provider. Document Released: 06/04/2015 Document Revised: 02/14/2017 Document Reviewed:  07/03/2016 Elsevier Patient Education  [place].       Inguinal Hernia, Adult

## 2018-11-13 NOTE — Progress Notes (Signed)
11/13/2018  Reason for Visit:  Right groin pain  Referring Provider:  Delsa Grana, PA-C  History of Present Illness: Blake Rivera is a 84 y.o. male presenting for evaluation of a possible right inguinal hernia.  Patient reports that he had rotator cuff surgery done earlier this year and he was recently able to resume golfing.  He had been golfing recently and since a week ago has been feeling some pressure sensation on the right groin.  He had a right inguinal hernia repair done in 1995 or so.  He denies any worsening pain.  Denies any bulging sensation.  Reports that the pressure is sometimes not noticeable.  Denies any issues with constipation, nausea, or vomiting.  Denies any worse symptoms when having bowel movement or coughing.  No issues on the left groin.  He went to see his PCP and there was no palpable bulging on the right groin on exam.  However, he did have an umbilical hernia noted.  Patient reports that he's had that for "100 years" and has not caused him any issues.  Past Medical History: Past Medical History:  Diagnosis Date  . Diverticulitis of colon   . ED (erectile dysfunction)   . Hyperglycemia   . Hypertension   . Vitamin D deficiency      Past Surgical History: Past Surgical History:  Procedure Laterality Date  . HERNIA REPAIR    . ROTATOR CUFF REPAIR  03/17/2018    Home Medications: Prior to Admission medications   Medication Sig Start Date End Date Taking? Authorizing Provider  amLODipine (NORVASC) 10 MG tablet TAKE 1 TABLET BY MOUTH EVERY DAY 05/17/18  Yes Poulose, Bethel Born, NP  aspirin 81 MG tablet Take 81 mg by mouth daily.   Yes [provider]  cholecalciferol (VITAMIN D) 1000 units tablet Take 1,000 Units by mouth daily.   Yes [provider]  Cod Liver Oil OIL Take 1 tablet by mouth daily.   Yes [provider]  tamsulosin (FLOMAX) 0.4 MG CAPS capsule Take 1 capsule (0.4 mg total) by mouth daily. 10/30/18  Yes Delsa Grana, PA-C  Thiamine HCl (VITAMIN B-1) 250 MG tablet Take 250 mg by mouth daily.   Yes [provider]  vitamin B-12 (CYANOCOBALAMIN) 1000 MCG tablet Take by mouth.   Yes [provider]    Allergies: Allergies  Allergen Reactions  . Lisinopril     Angioedema  . [place]  [Omega-3-Acid Ethyl Esters]     Other reaction(s): Rash  . Zocor  [Simvastatin]     Muscle Pain    Social History:  reports that he quit smoking about 4 years ago. His smoking use included cigarettes. He has a 6.25 pack-year smoking history. He has never used smokeless tobacco. He reports current alcohol use of about 1.0 standard drinks of alcohol per week. He reports that he does not use drugs.   Family History: Family History  Problem Relation Age of Onset  . Heart disease Mother   . Aneurysm Mother   . Heart disease Brother   . Emphysema Father   . Healthy Sister   . Healthy Brother   . Healthy Brother   . Heart disease Sister     Review of Systems: Review of Systems  Constitutional: Negative for chills and fever.  HENT: Negative for hearing loss.   Eyes: Negative for blurred vision.  Respiratory: Negative for shortness of breath.   Cardiovascular: Negative for chest pain.  Gastrointestinal: Positive for abdominal pain (  right groin pressure). Negative for constipation, diarrhea, nausea and vomiting.  Genitourinary: Negative for dysuria.  Musculoskeletal: Negative for myalgias.  Skin: Negative for rash.  Neurological: Negative for dizziness.  Psychiatric/Behavioral: Negative for depression.    Physical Exam BP 115/79   Pulse 68   Temp (!) 97.2 F (36.2 C) (Temporal)   Resp 12   Ht 6' (1.829 m)   Wt 233 lb 3.2 oz (105.8 kg)   SpO2 98%   BMI 31.63 kg/m  CONSTITUTIONAL: No acute distress HEENT:  Normocephalic, atraumatic, extraocular motion intact. NECK: Trachea is midline, and there is no jugular venous distension.  RESPIRATORY:  Lungs are clear, and breath sounds are  equal bilaterally. Normal respiratory effort without pathologic use of accessory muscles. CARDIOVASCULAR: Heart is regular without murmurs, gallops, or rubs. GI: The abdomen is soft, obese, non-distended, non-tender to palpation.  Patient has a faint, well healed right groin scar from hernia repair.  No palpable bulging or any evidence of hernia recurrence when straining or coughing.  No evidence of hernia on the left groin.  Patient does have a small umbilical hernia, easily reducible, as well as diastasis recti in the upper abdomen.  MUSCULOSKELETAL:  Normal muscle strength and tone in all four extremities.  No peripheral edema or cyanosis. SKIN: Skin turgor is normal. There are no pathologic skin lesions.  NEUROLOGIC:  Motor and sensation is grossly normal.  Cranial nerves are grossly intact. PSYCH:  Alert and oriented to person, place and time. Affect is normal.  Laboratory Analysis: No results found for this or any previous visit (from the past 24 hour(s)).  Imaging: Labs 10/30/18: Na 143, K 4.6, Cl 106, CO2 29, BUN 10, Cr 1.01.  LFTs all within normal.  WBC 5.3, Hgb 15.4, Hct 47.6, Plt 218.  HgbA1c 6.2  Assessment and Plan: This is a 84 y.o. male with right groin pressure.  Discussed with the patient that based on his symptoms and exam, I do not think he has a recurrence of the right inguinal hernia.  I do not feel any bulging.  I do agree with his PCP that he has an umbilical hernia and diastasis recti.  Discussed with the patient that if he wanted to be absolutely sure about any recurrence, we could do a CT scan of abdomen/pelvis to evaluate.  This could very well be musculoskeletal in nature given that he just recently started golfing again since his shoulder surgery 7 months ago.  Discussed with him that if he wanted, we can do watchful waiting at this point and evaluate any further symptoms, and if in the next month, nothing has improved, we can do a CT to evaluate further.  He will go  home and discuss his options with his family and get back to [place].  Otherwise, follow up as needed.  Face-to-face time spent with the patient and care providers was 40 minutes, with more than 50% of the time spent counseling, educating, and coordinating care of the patient.     Melvyn Neth, [facility]

## 2018-11-21 ENCOUNTER — Ambulatory Visit: Payer: Medicare Other

## 2018-11-25 ENCOUNTER — Other Ambulatory Visit: Payer: Self-pay

## 2018-11-25 DIAGNOSIS — I10 Essential (primary) hypertension: Secondary | ICD-10-CM

## 2018-11-25 MED ORDER — AMLODIPINE BESYLATE 10 MG PO TABS: 10.0000 mg | ORAL_TABLET | Freq: Every day | ORAL | 1 refills | Status: DC

## 2018-11-26 ENCOUNTER — Ambulatory Visit: Payer: Self-pay | Admitting: Surgery

## 2018-12-31 ENCOUNTER — Ambulatory Visit: Payer: Medicare Other

## 2019-01-21 ENCOUNTER — Ambulatory Visit: Payer: Medicare Other

## 2019-01-23 ENCOUNTER — Ambulatory Visit (INDEPENDENT_AMBULATORY_CARE_PROVIDER_SITE_OTHER): Payer: Medicare Other

## 2019-01-23 ENCOUNTER — Other Ambulatory Visit: Payer: Self-pay

## 2019-01-23 VITALS — BP 104/66 | HR 64 | Temp 96.9°F | Resp 16 | Ht 72.0 in | Wt 236.6 lb

## 2019-01-23 DIAGNOSIS — Z0000 Encounter for general adult medical examination without abnormal findings: Secondary | ICD-10-CM | POA: Diagnosis not present

## 2019-01-23 NOTE — Progress Notes (Signed)
Subjective:   Blake Rivera is a 85 y.o. male who presents for Medicare Annual/Subsequent preventive examination.  Review of Systems:    Cardiac Risk Factors include: advanced age (>98men, >66 women);hypertension;male gender;obesity (BMI >30kg/m2)     Objective:    Vitals: BP 104/66 (BP Location: Left Arm, Patient Position: Sitting, Cuff Size: Large)   Pulse 64   Temp (!) 96.9 F (36.1 C) (Temporal)   Resp 16   Ht 6' (1.829 m)   Wt 236 lb 9.6 oz (107.3 kg)   SpO2 98%   BMI 32.09 kg/m   Body mass index is 32.09 kg/m.  Advanced Directives 01/23/2019 12/25/2017 12/22/2016 08/22/2016 07/03/2016 02/07/2016 02/25/2015  Does Patient Have a Medical Advance Directive? Yes Yes Yes No No No No  Type of Paramedic of [place];Living will [place];Living will [place];Living will - - - -  Does patient want to make changes to medical advance directive? - - - - - - -  Copy of [place] in Chart? No - copy requested No - copy requested No - copy requested - - - -  Would patient like information on creating a medical advance directive? - - - - - - No - patient declined information    Tobacco Social History   Tobacco Use  Smoking Status Former Smoker  . Packs/day: 0.25  . Years: 25.00  . Pack years: 6.25  . Types: Cigarettes  . Quit date: 05/18/2014  . Years since quitting: 4.6  Smokeless Tobacco Never Used  Tobacco Comment   Former smoker - no need to provide with smoking cessation materials     Counseling given: Not Answered Comment: Former smoker - no need to provide with smoking cessation materials   Clinical Intake:  Pre-visit preparation completed: Yes  Pain : No/denies pain     BMI - recorded: 32.09 Nutritional Status: BMI > 30  Obese Nutritional Risks: None Diabetes: No  How often do you need to have someone help you when you read instructions, pamphlets, or other written materials from  your doctor or pharmacy?: 1 - Never  Interpreter Needed?: No  Information entered by :: Clemetine Marker LPN  Past Medical History:  Diagnosis Date  . Diverticulitis of colon   . ED (erectile dysfunction)   . Hyperglycemia   . Hypertension   . Vitamin D deficiency    Past Surgical History:  Procedure Laterality Date  . HERNIA REPAIR    . ROTATOR CUFF REPAIR  03/17/2018   Family History  Problem Relation Age of Onset  . Heart disease Mother   . Aneurysm Mother   . Heart disease Brother   . Emphysema Father   . Healthy Sister   . Healthy Brother   . Healthy Brother   . Heart disease Sister    Social History   Socioeconomic History  . Marital status: Married    Spouse name: Not on file  . Number of children: 2  . Years of education: Not on file  . Highest education level: Doctorate  Occupational History  . Occupation: Retired  Tobacco Use  . Smoking status: Former Smoker    Packs/day: 0.25    Years: 25.00    Pack years: 6.25    Types: Cigarettes    Quit date: 05/18/2014    Years since quitting: 4.6  . Smokeless tobacco: Never Used  . Tobacco comment: Former smoker - no need to provide with smoking cessation  materials  Substance and Sexual Activity  . Alcohol use: Yes    Alcohol/week: 1.0 standard drinks    Types: 1 Shots of liquor per week    Comment: moderate - 1 whiskey shot per day  . Drug use: No  . Sexual activity: Yes  Other Topics Concern  . Not on file  Social History Narrative  . Not on file   Social Determinants of Health   Financial Resource Strain: Low Risk   . Difficulty of Paying Living Expenses: Not hard at all  Food Insecurity: No Food Insecurity  . Worried About Charity fundraiser in the Last Year: Never true  . Ran Out of Food in the Last Year: Never true  Transportation Needs: No Transportation Needs  . Lack of Transportation (Medical): No  . Lack of Transportation (Non-Medical): No  Physical Activity: Sufficiently Active  . Days of  Exercise per Week: 3 days  . Minutes of Exercise per Session: 60 min  Stress: No Stress Concern Present  . Feeling of Stress : Not at all  Social Connections: Not Isolated  . Frequency of Communication with Friends and Family: More than three times a week  . Frequency of Social Gatherings with Friends and Family: Once a week  . Attends Religious Services: More than 4 times per year  . Active Member of Clubs or Organizations: Yes  . Attends Archivist Meetings: More than 4 times per year  . Marital Status: Married    Outpatient Encounter Medications as of 01/23/2019  Medication Sig  . amLODipine (NORVASC) 10 MG tablet Take 1 tablet (10 mg total) by mouth daily.  . cholecalciferol (VITAMIN D) 1000 units tablet Take 1,000 Units by mouth daily.  . Cod Liver Oil OIL Take 1 tablet by mouth daily.  . tamsulosin (FLOMAX) 0.4 MG CAPS capsule Take 1 capsule (0.4 mg total) by mouth daily.  . vitamin B-12 (CYANOCOBALAMIN) 1000 MCG tablet Take by mouth.  Marland Kitchen aspirin 81 MG tablet Take 81 mg by mouth daily.  . Thiamine HCl (VITAMIN B-1) 250 MG tablet Take 250 mg by mouth daily.   No facility-administered encounter medications on file as of 01/23/2019.    Activities of Daily Living In your present state of health, do you have any difficulty performing the following activities: 01/23/2019 10/30/2018  Hearing? N N  Comment declines hearing aids -  Vision? N N  Difficulty concentrating or making decisions? N N  Walking or climbing stairs? N N  Dressing or bathing? N N  Doing errands, shopping? N N  Preparing Food and eating ? N -  Using the Toilet? N -  In the past six months, have you accidently leaked urine? N -  Do you have problems with loss of bowel control? N -  Managing your Medications? N -  Managing your Finances? N -  Housekeeping or managing your Housekeeping? N -  Some recent data might be hidden    Patient Care Team: Delsa Grana, PA-C as PCP - General (Family Medicine)     Assessment:   This is a routine wellness examination for [place].  Exercise Activities and Dietary recommendations Current Exercise Habits: Home exercise routine, Type of exercise: Other - see comments(golf), Time (Minutes): 60, Frequency (Times/Week): 3, Weekly Exercise (Minutes/Week): 180, Intensity: Mild, Exercise limited by: orthopedic condition(s)  Goals    . DIET - INCREASE WATER INTAKE     Recommend 6-8 glasses of water per day.       Fall  Risk Fall Risk  01/23/2019 11/13/2018 10/30/2018 12/25/2017 12/03/2017  Falls in the past year? 0 0 0 0 0  Number falls in past yr: 0 - 0 0 -  Injury with Fall? 0 - 0 - -  Risk for fall due to : No Fall Risks - - - -  Risk for fall due to: Comment - - - - -  Follow up Falls prevention discussed - - - -   FALL RISK PREVENTION PERTAINING TO THE HOME:  Any stairs in or around the home? Yes  If so, do they handrails? Yes   Home free of loose throw rugs in walkways, pet beds, electrical cords, etc? Yes  Adequate lighting in your home to reduce risk of falls? Yes   ASSISTIVE DEVICES UTILIZED TO PREVENT FALLS:  Life alert? No  Use of a cane, walker or w/c? No  Grab bars in the bathroom? Yes  Shower chair or bench in shower? No  Elevated toilet seat or a handicapped toilet? Yes   DME ORDERS:  DME order needed?  No   TIMED UP AND GO:  Was the test performed? Yes .  Length of time to ambulate 10 feet: 5 sec.   GAIT:  Appearance of gait: Gait stead-fast and without the use of an assistive device.   Education: Fall risk prevention has been discussed.  Intervention(s) required? No   Depression Screen PHQ 2/9 Scores 01/23/2019 10/30/2018 12/25/2017 12/03/2017  PHQ - 2 Score 0 0 0 0  PHQ- 9 Score - 0 0 0    Cognitive Function 6CIT deferred for 2020 AWV. Pt declined.      6CIT Screen 12/25/2017 12/22/2016  What Year? 0 points 0 points  What month? 0 points 0 points  What time? 0 points 0 points  Count back from 20 0 points 0  points  Months in reverse 0 points 0 points  Repeat phrase 2 points 0 points  Total Score 2 0    Immunization History  Administered Date(s) Administered  . Fluad Quad(high Dose 65+) 10/30/2018  . Influenza Split 12/02/2013  . Influenza, High Dose Seasonal PF 10/24/2016, 10/16/2017  . Influenza, Seasonal, Injecte, Preservative Fre 11/11/2012  . Influenza-Unspecified 12/06/2014, 10/25/2015  . Tdap 01/17/2011    Qualifies for Shingles Vaccine? Yes . Due for Shingrix. Education has been provided regarding the importance of this vaccine. Pt has been advised to call insurance company to determine out of pocket expense. Advised may also receive vaccine at local pharmacy or Health Dept. Verbalized acceptance and understanding.  Tdap: Up to date  Flu Vaccine: Up to date  Pneumococcal Vaccine: Due for Pneumococcal vaccine. Does the patient want to receive this vaccine today?  No . Education has been provided regarding the importance of this vaccine but still declined. Advised may receive this vaccine at local pharmacy or Health Dept. Aware to provide a copy of the vaccination record if obtained from local pharmacy or Health Dept. Verbalized acceptance and understanding.   Screening Tests Health Maintenance  Topic Date Due  . PNA vac Low Risk Adult (1 of 2 - PCV13) 01/30/2001  . TETANUS/TDAP  01/16/2021  . INFLUENZA VACCINE  Completed   Cancer Screenings:  Colorectal Screening:  No longer required.   Lung Cancer Screening: (Low Dose CT Chest recommended if Age 63-80 years, 30 pack-year currently smoking OR have quit w/in 15years.) does not qualify.   Additional Screening:  Hepatitis C Screening: no longer required  Vision Screening: Recommended annual ophthalmology exams for  early detection of glaucoma and other disorders of the eye. Is the patient up to date with their annual eye exam?  Yes  Who is the provider or what is the name of the office in which the pt attends annual eye  exams? [place] Screening: Recommended annual dental exams for proper oral hygiene  Community Resource Referral:  CRR required this visit?  No       Plan:    I have personally reviewed and addressed the Medicare Annual Wellness questionnaire and have noted the following in the patient's chart:  A. Medical and social history B. Use of alcohol, tobacco or illicit drugs  C. Current medications and supplements D. Functional ability and status E.  Nutritional status F.  Physical activity G. Advance directives H. List of other physicians I.  Hospitalizations, surgeries, and ER visits in previous 12 months J.  [place] such as hearing and vision if needed, cognitive and depression L. Referrals and appointments   In addition, I have reviewed and discussed with patient certain preventive protocols, quality metrics, and best practice recommendations. A written personalized care plan for preventive services as well as general preventive health recommendations were provided to patient.   Signed,  Clemetine Marker, LPN Nurse Health Advisor   Nurse Notes: none

## 2019-01-23 NOTE — Patient Instructions (Signed)
Blake Rivera , Thank you for taking time to come for your Medicare Wellness Visit. I appreciate your ongoing commitment to your health goals. Please review the following plan we discussed and let me know if I can assist you in the future.   Screening recommendations/referrals: Colonoscopy: no longer required Recommended yearly ophthalmology/optometry visit for glaucoma screening and checkup Recommended yearly dental visit for hygiene and checkup  Vaccinations: Influenza vaccine: done 10/30/18 Pneumococcal vaccine: postponed Tdap vaccine: done 2013 Shingles vaccine: Shingrix discussed. Please contact your pharmacy for coverage information.   Advanced directives: Please bring a copy of your health care power of attorney and living will to the office at your convenience.  Conditions/risks identified: Recommend drinking 6-8 glasses of water per day  Next appointment: Please follow up in one year for your Medicare Annual Wellness visit.    Preventive Care 41 Years and Older, Male Preventive care refers to lifestyle choices and visits with your health care provider that can promote health and wellness. What does preventive care include?  A yearly physical exam. This is also called an annual well check.  Dental exams once or twice a year.  Routine eye exams. Ask your health care provider how often you should have your eyes checked.  Personal lifestyle choices, including:  Daily care of your teeth and gums.  Regular physical activity.  Eating a healthy diet.  Avoiding tobacco and drug use.  Limiting alcohol use.  Practicing safe sex.  Taking low doses of aspirin every day.  Taking vitamin and mineral supplements as recommended by your health care provider. What happens during an annual well check? The services and screenings done by your health care provider during your annual well check will depend on your age, overall health, lifestyle risk factors, and family history of  disease. Counseling  Your health care provider may ask you questions about your:  Alcohol use.  Tobacco use.  Drug use.  Emotional well-being.  Home and relationship well-being.  Sexual activity.  Eating habits.  History of falls.  Memory and ability to understand (cognition).  Work and work Statistician. Screening  You may have the following tests or measurements:  Height, weight, and BMI.  Blood pressure.  Lipid and cholesterol levels. These may be checked every 5 years, or more frequently if you are over 12 years old.  Skin check.  Lung cancer screening. You may have this screening every year starting at age 34 if you have a 30-pack-year history of smoking and currently smoke or have quit within the past 15 years.  Fecal occult blood test (FOBT) of the stool. You may have this test every year starting at age 19.  Flexible sigmoidoscopy or colonoscopy. You may have a sigmoidoscopy every 5 years or a colonoscopy every 10 years starting at age 38.  Prostate cancer screening. Recommendations will vary depending on your family history and other risks.  Hepatitis C blood test.  Hepatitis B blood test.  Sexually transmitted disease (STD) testing.  Diabetes screening. This is done by checking your blood sugar (glucose) after you have not eaten for a while (fasting). You may have this done every 1-3 years.  Abdominal aortic aneurysm (AAA) screening. You may need this if you are a current or former smoker.  Osteoporosis. You may be screened starting at age 37 if you are at high risk. Talk with your health care provider about your test results, treatment options, and if necessary, the need for more tests. Vaccines  Your health care provider  may recommend certain vaccines, such as:  Influenza vaccine. This is recommended every year.  Tetanus, diphtheria, and acellular pertussis (Tdap, Td) vaccine. You may need a Td booster every 10 years.  Zoster vaccine. You may  need this after age 39.  Pneumococcal 13-valent conjugate (PCV13) vaccine. One dose is recommended after age 56.  Pneumococcal polysaccharide (PPSV23) vaccine. One dose is recommended after age 45. Talk to your health care provider about which screenings and vaccines you need and how often you need them. This information is not intended to replace advice given to you by your health care provider. Make sure you discuss any questions you have with your health care provider. Document Released: 01/29/2015 Document Revised: 09/22/2015 Document Reviewed: 11/03/2014 Elsevier Interactive Patient Education  2017 [place] Prevention in the Home Falls can cause injuries. They can happen to people of all ages. There are many things you can do to make your home safe and to help prevent falls. What can I do on the outside of my home?  Regularly fix the edges of walkways and driveways and fix any cracks.  Remove anything that might make you trip as you walk through a door, such as a raised step or threshold.  Trim any bushes or trees on the path to your home.  Use bright outdoor lighting.  Clear any walking paths of anything that might make someone trip, such as rocks or tools.  Regularly check to see if handrails are loose or broken. Make sure that both sides of any steps have handrails.  Any raised decks and porches should have guardrails on the edges.  Have any leaves, snow, or ice cleared regularly.  Use sand or salt on walking paths during winter.  Clean up any spills in your garage right away. This includes oil or grease spills. What can I do in the bathroom?  Use night lights.  Install grab bars by the toilet and in the tub and shower. Do not use towel bars as grab bars.  Use non-skid mats or decals in the tub or shower.  If you need to sit down in the shower, use a plastic, non-slip stool.  Keep the floor dry. Clean up any water that spills on the floor as soon as it  happens.  Remove soap buildup in the tub or shower regularly.  Attach bath mats securely with double-sided non-slip rug tape.  Do not have throw rugs and other things on the floor that can make you trip. What can I do in the bedroom?  Use night lights.  Make sure that you have a light by your bed that is easy to reach.  Do not use any sheets or blankets that are too big for your bed. They should not hang down onto the floor.  Have a firm chair that has side arms. You can use this for support while you get dressed.  Do not have throw rugs and other things on the floor that can make you trip. What can I do in the kitchen?  Clean up any spills right away.  Avoid walking on wet floors.  Keep items that you use a lot in easy-to-reach places.  If you need to reach something above you, use a strong step stool that has a grab bar.  Keep electrical cords out of the way.  Do not use floor polish or wax that makes floors slippery. If you must use wax, use non-skid floor wax.  Do not have throw rugs  and other things on the floor that can make you trip. What can I do with my stairs?  Do not leave any items on the stairs.  Make sure that there are handrails on both sides of the stairs and use them. Fix handrails that are broken or loose. Make sure that handrails are as long as the stairways.  Check any carpeting to make sure that it is firmly attached to the stairs. Fix any carpet that is loose or worn.  Avoid having throw rugs at the top or bottom of the stairs. If you do have throw rugs, attach them to the floor with carpet tape.  Make sure that you have a light switch at the top of the stairs and the bottom of the stairs. If you do not have them, ask someone to add them for you. What else can I do to help prevent falls?  Wear shoes that:  Do not have high heels.  Have rubber bottoms.  Are comfortable and fit you well.  Are closed at the toe. Do not wear sandals.  If you  use a stepladder:  Make sure that it is fully opened. Do not climb a closed stepladder.  Make sure that both sides of the stepladder are locked into place.  Ask someone to hold it for you, if possible.  Clearly mark and make sure that you can see:  Any grab bars or handrails.  First and last steps.  Where the edge of each step is.  Use tools that help you move around (mobility aids) if they are needed. These include:  Canes.  Walkers.  Scooters.  Crutches.  Turn on the lights when you go into a dark area. Replace any light bulbs as soon as they burn out.  Set up your furniture so you have a clear path. Avoid moving your furniture around.  If any of your floors are uneven, fix them.  If there are any pets around you, be aware of where they are.  Review your medicines with your doctor. Some medicines can make you feel dizzy. This can increase your chance of falling. Ask your doctor what other things that you can do to help prevent falls. This information is not intended to replace advice given to you by your health care provider. Make sure you discuss any questions you have with your health care provider. Document Released: 10/29/2008 Document Revised: 06/10/2015 Document Reviewed: 02/06/2014 Elsevier Interactive Patient Education  2017 [employer].

## 2019-02-19 ENCOUNTER — Encounter: Payer: Self-pay | Admitting: *Deleted

## 2019-04-17 ENCOUNTER — Other Ambulatory Visit: Payer: Self-pay | Admitting: Family Medicine

## 2019-04-17 DIAGNOSIS — I10 Essential (primary) hypertension: Secondary | ICD-10-CM

## 2019-05-10 ENCOUNTER — Other Ambulatory Visit: Payer: Self-pay | Admitting: Family Medicine

## 2019-05-10 DIAGNOSIS — R399 Unspecified symptoms and signs involving the genitourinary system: Secondary | ICD-10-CM

## 2019-05-10 DIAGNOSIS — R351 Nocturia: Secondary | ICD-10-CM

## 2019-05-10 DIAGNOSIS — Z5181 Encounter for therapeutic drug level monitoring: Secondary | ICD-10-CM

## 2019-08-04 ENCOUNTER — Telehealth: Payer: Self-pay

## 2019-08-04 NOTE — Telephone Encounter (Signed)
Lvm at [phone] informing pt to schedule an appt in order to receive refills

## 2019-08-04 NOTE — Telephone Encounter (Signed)
pT NEEDS APPT FOR REFILLS

## 2019-08-08 ENCOUNTER — Other Ambulatory Visit: Payer: Self-pay | Admitting: Family Medicine

## 2019-08-08 DIAGNOSIS — R399 Unspecified symptoms and signs involving the genitourinary system: Secondary | ICD-10-CM

## 2019-08-08 DIAGNOSIS — Z5181 Encounter for therapeutic drug level monitoring: Secondary | ICD-10-CM

## 2019-08-08 DIAGNOSIS — R351 Nocturia: Secondary | ICD-10-CM

## 2019-08-20 NOTE — Progress Notes (Signed)
Patient ID: Blake Rivera, male    DOB: 06-24-1934, 85 y.o.   MRN: [id number]  PCP: Towanda Malkin, MD  Chief Complaint  Patient presents with  . Follow-up  . Nocturia    urinary frequency at night    Subjective:   Blake Rivera is a 85 y.o. male, presents to clinic with CC of the following:  Chief Complaint  Patient presents with  . Follow-up  . Nocturia    urinary frequency at night    HPI:  Patient is an 85 year old male Was last seen at [facility] by Threasa Alpha in October 2020 Follows up today.  It was noted to him that he needed a follow-up appointment for refills.  After that visit, he was seen November 13, 2018 by general surgery for a hernia concern, with the following impression/plan:  Discussed with the patient that based on his symptoms and exam, I do not think he has a recurrence of the right inguinal hernia.  I do not feel any bulging.  I do agree with his PCP that he has an umbilical hernia and diastasis recti.  Discussed with the patient that if he wanted to be absolutely sure about any recurrence, we could do a CT scan of abdomen/pelvis to evaluate.  This could very well be musculoskeletal in nature given that he just recently started golfing again since his shoulder surgery 7 months ago.  Discussed with him that if he wanted, we can do watchful waiting at this point and evaluate any further symptoms, and if in the next month, nothing has improved, we can do a CT to evaluate further.  He will go home and discuss his options with his family and get back to [place].  Otherwise, follow up as needed. He notes things have been well in the recent past with no increased symptoms of concern.  LUTS/prostate cancer screening -  frequency and nocturia with weaker stream noted last visit, and a trial of tamsulosin was initiated at that visit. Not taking tamsulosin regularly, misses about one dose a week. Now up 2-3 times a night to urinate, no hesitancy, no urgency, often  slow stream, no hematuria, occas soreness in bladder area noted, no dysuria, and no frequency during the day, only at night. Occas back pains, low back and can extend up towards the kidneys bilateral. No h/o kidney stones  Lab Results  Component Value Date   PSA1 2.2 11/25/2014   PSA1 1.5 08/26/2014   PSA 1.1 10/30/2018   PSA 1.1 07/03/2016   No FH prostate CA  Obesity  Wt Readings from Last 3 Encounters:  08/21/19 229 lb (103.9 kg)  01/23/19 236 lb 9.6 oz (107.3 kg)  11/13/18 233 lb 3.2 oz (105.8 kg)   Weight has decreased some since last visit in January, about 7 pounds, has been eating less  Diet- trying to eat healthy Exercise- plays golf 3X week, rides carts  Hypertension:  Medication regimen- norvasc 10 mg Takes medication regularly Does not check blood pressures at home.  BP Readings from Last 3 Encounters:  08/21/19 124/74  01/23/19 104/66  11/13/18 115/79    Denies chest pain, palpitations, shortness of breath, increased lower extremity swelling, increased headaches or vision changes   Hyperlipidemia:  Medication Regimen:   None, as he had an allergy to simvastatin (bad dreams) noted previously, tries to watch fats in diet Last Lipids: Lab Results  Component Value Date   CHOL 190 10/30/2018   HDL 50  10/30/2018   LDLCALC 124 (H) 10/30/2018   TRIG 72 10/30/2018   CHOLHDL 3.8 10/30/2018    No dark or black stools,no BRBPR, no chronic abd pains  Patient Active Problem List   Diagnosis Date Noted  . Rotator cuff tendinitis, left 02/08/2018  . Traumatic complete tear of left rotator cuff 02/08/2018  . Localized, primary osteoarthritis 12/25/2017  . Lipoma of scalp 12/03/2017  . Obesity (BMI 30.0-34.9) 12/03/2017  . CKD (chronic kidney disease) stage 2, GFR 60-89 ml/min 12/03/2017  . Productive cough 02/07/2016  . Dyslipidemia 11/25/2014  . Rising PSA level 11/25/2014  . Annual physical exam 08/26/2014  . Abnormal ECG 07/16/2014  . History of  colitis 07/16/2014  . ED (erectile dysfunction) 07/16/2014  . Anterior knee pain 07/16/2014  . Arthralgia of shoulder 07/16/2014  . Disturbance of skin sensation 07/16/2014  . Avitaminosis D 07/16/2014  . Dyspnea 12/29/2013  . HTN (hypertension) 12/29/2013      Current Outpatient Medications:  .  amLODipine (NORVASC) 10 MG tablet, TAKE 1 TABLET BY MOUTH EVERY DAY, Disp: 90 tablet, Rfl: 0 .  cholecalciferol (VITAMIN D) 1000 units tablet, Take 1,000 Units by mouth daily., Disp: , Rfl:  .  Cod Liver Oil OIL, Take 1 tablet by mouth daily., Disp: , Rfl:  .  tamsulosin (FLOMAX) 0.4 MG CAPS capsule, TAKE 1 CAPSULE BY MOUTH EVERY DAY, Disp: 90 capsule, Rfl: 1   Allergies  Allergen Reactions  . Lisinopril     Angioedema  . [place]  [Omega-3-Acid Ethyl Esters]     Other reaction(s): Rash  . Zocor  [Simvastatin]     Muscle Pain     Past Surgical History:  Procedure Laterality Date  . HERNIA REPAIR    . ROTATOR CUFF REPAIR  03/17/2018     Family History  Problem Relation Age of Onset  . Heart disease Mother   . Aneurysm Mother   . Heart disease Brother   . Emphysema Father   . Healthy Sister   . Healthy Brother   . Healthy Brother   . Heart disease Sister      Social History   Tobacco Use  . Smoking status: Former Smoker    Packs/day: 0.25    Years: 25.00    Pack years: 6.25    Types: Cigarettes    Quit date: 05/18/2014    Years since quitting: 5.2  . Smokeless tobacco: Never Used  . Tobacco comment: Former smoker - no need to provide with smoking cessation materials  Substance Use Topics  . Alcohol use: Yes    Alcohol/week: 1.0 standard drink    Types: 1 Shots of liquor per week    Comment: moderate - 1 whiskey shot per day    With staff assistance, above reviewed with the patient today.  ROS: As per HPI, otherwise no specific complaints on a limited and focused system review   No results found for this or any previous visit (from the past 72  hour(s)).   PHQ2/9: Depression screen [facility] 2/9 08/21/2019 01/23/2019 10/30/2018 12/25/2017 12/03/2017  Decreased Interest 0 0 0 0 0  Down, Depressed, Hopeless 0 0 0 0 0  PHQ - 2 Score 0 0 0 0 0  Altered sleeping 0 - 0 0 0  Tired, decreased energy 0 - 0 0 0  Change in appetite 0 - 0 0 0  Feeling bad or failure about yourself  0 - 0 0 0  Trouble concentrating 0 - 0 0 0  Moving  slowly or fidgety/restless 0 - 0 - -  Suicidal thoughts 0 - 0 0 0  PHQ-9 Score 0 - 0 0 0  Difficult doing work/chores Not difficult at all - Not difficult at all - -   PHQ-2/9 Result is neg Fall Risk: Fall Risk  08/21/2019 01/23/2019 11/13/2018 10/30/2018 12/25/2017  Falls in the past year? 0 0 0 0 0  Number falls in past yr: 0 0 - 0 0  Injury with Fall? 0 0 - 0 -  Risk for fall due to : - No Fall Risks - - -  Risk for fall due to: Comment - - - - -  Follow up - Falls prevention discussed - - -      Objective:   Vitals:   08/21/19 0936  BP: 124/74  Pulse: (!) 58  Resp: 16  Temp: 97.8 F (36.6 C)  TempSrc: Temporal  SpO2: 98%  Weight: 229 lb (103.9 kg)  Height: 6' (1.829 m)    Body mass index is 31.06 kg/m.  Physical Exam   NAD, masked, pleasant HEENT - [place]/AT, sclera anicteric, PERRL, positive arcus, EOMI, conj - non-inj'ed, pharynx clear Neck - supple, no adenopathy, no TM, carotids 2+ and = without bruits bilat Car - RRR without m/g/r Pulm- RR and effort normal at rest, CTA without wheeze or rales Abd - soft, obese, NT diffusely, ND, BS+,  no masses, no marked tenderness palpating over the suprapubic region (where he sometimes notes some pressure/soreness) Back - no focal CVA tenderness, notes some soreness that is in the CVA region bilaterally, often extends below the CVA region bilaterally when feels the soreness. GU/rectal - Prostate - no tenderness, fluctuance, nodules on palpation, mildly increased in size Skin- no rash noted on exposed areas, patient denied otherwise Ext - no LE edema,  positive venous varicosities noted in the lower extremity, much more pronounced on the left lower extremity than the right. Neuro/psychiatric - affect was not flat, appropriate with conversation  Alert   Grossly non-focal - good strength on testing extremities, sensation intact to LT in distal extremities  Speech and gait are normal   Results for orders placed or performed in visit on 10/30/18  Urine Culture   Specimen: Urine  Result Value Ref Range   MICRO NUMBER: [id number]    SPECIMEN QUALITY: Adequate    Sample Source URINE, CLEAN CATCH    STATUS: FINAL    Result: No Growth   CBC with Differential/Platelet  Result Value Ref Range   WBC 5.3 3.8 - 10.8 Thousand/uL   RBC 5.38 4.20 - 5.80 Million/uL   Hemoglobin 15.4 13.2 - 17.1 g/dL   HCT 47.6 38 - 50 %   MCV 88.5 80.0 - 100.0 fL   MCH 28.6 27.0 - 33.0 pg   MCHC 32.4 32.0 - 36.0 g/dL   RDW 13.5 11.0 - 15.0 %   Platelets 218 140 - 400 Thousand/uL   MPV 9.4 7.5 - 12.5 fL   Neutro Abs 2,634 1,500 - 7,800 cells/uL   Lymphs Abs 2,109 850 - 3,900 cells/uL   Absolute Monocytes 466 200 - 950 cells/uL   Eosinophils Absolute 69 15 - 500 cells/uL   Basophils Absolute 21 0 - 200 cells/uL   Neutrophils Relative % 49.7 %   Total Lymphocyte 39.8 %   Monocytes Relative 8.8 %   Eosinophils Relative 1.3 %   Basophils Relative 0.4 %  COMPLETE METABOLIC PANEL WITH GFR  Result Value Ref Range   Glucose, Bld  111 (H) 65 - 99 mg/dL   BUN 10 7 - 25 mg/dL   Creat 1.01 0.70 - 1.11 mg/dL   GFR, Est Non African American 69 > OR = 60 mL/min/1.69m2   GFR, Est African American 80 > OR = 60 mL/min/1.68m2   BUN/Creatinine Ratio NOT APPLICABLE 6 - 22 (calc)   Sodium 143 135 - 146 mmol/L   Potassium 4.6 3.5 - 5.3 mmol/L   Chloride 106 98 - 110 mmol/L   CO2 29 20 - 32 mmol/L   Calcium 9.3 8.6 - 10.3 mg/dL   Total Protein 6.8 6.1 - 8.1 g/dL   Albumin 4.4 3.6 - 5.1 g/dL   Globulin 2.4 1.9 - 3.7 g/dL (calc)   AG Ratio 1.8 1.0 - 2.5 (calc)   Total  Bilirubin 0.5 0.2 - 1.2 mg/dL   Alkaline phosphatase (APISO) 69 35 - 144 U/L   AST 13 10 - 35 U/L   ALT 11 9 - 46 U/L  Lipid panel  Result Value Ref Range   Cholesterol 190 <200 mg/dL   HDL 50 > OR = 40 mg/dL   Triglycerides 72 <150 mg/dL   LDL Cholesterol (Calc) 124 (H) mg/dL (calc)   Total CHOL/HDL Ratio 3.8 <5.0 (calc)   Non-HDL Cholesterol (Calc) 140 (H) <130 mg/dL (calc)  Hemoglobin A1c  Result Value Ref Range   Hgb A1c MFr Bld 6.2 (H) <5.7 % of total Hgb   Mean Plasma Glucose 131 (calc)   eAG (mmol/L) 7.3 (calc)  Urinalysis, Routine w reflex microscopic  Result Value Ref Range   Color, Urine YELLOW YELLOW   APPearance CLEAR CLEAR   Specific Gravity, Urine 1.020 1.001 - 1.03   pH < OR = 5.0 5.0 - 8.0   Glucose, UA NEGATIVE NEGATIVE   Bilirubin Urine NEGATIVE NEGATIVE   Ketones, ur NEGATIVE NEGATIVE   Hgb urine dipstick NEGATIVE NEGATIVE   Protein, ur NEGATIVE NEGATIVE   Nitrite NEGATIVE NEGATIVE   Leukocytes,Ua NEGATIVE NEGATIVE  PSA  Result Value Ref Range   PSA 1.1 < OR = 4.0 ng/mL       Assessment & Plan:    1. Lower urinary tract symptoms (LUTS)/2. Nocturia He notes he is having some mild frequency in the nighttime, not significantly changed with the tamsulosin product prescribed recently.  No marked hesitancy or urgency symptoms noted, prostate mildly increased in size on exam, otherwise unremarkable.  PSA checks in the recent past have been good. Felt best to increase the tamsulosin to 2 tablets daily and assess his response. Do think he has possible mild prostatitis contributing as well and manage as below noted. - tamsulosin (FLOMAX) 0.4 MG CAPS capsule; Take 2 capsules (0.8 mg total) by mouth daily.  Dispense: 180 capsule; Refill: 3 - Urine Culture - POCT Urinalysis Dipstick - Urinalysis, Complete  3. Bilateral low back pain without sciatica, unspecified chronicity No concerns for a pyelonephritis, with likely more of a muscle source to the low back  pains intermittently.  Cannot exclude some type of mild prostatitis contributing, and managing as below.  4. Essential hypertension Blood pressure remains controlled on the Norvasc Continue that medication presently.  5.  Hyperlipidemia Reviewed last lipid panel, with goal of getting the LDL less than 100. Has not tolerated the statin previously. Continue with dietary modifications, and information provided in the AVS to help as well. We will recheck panel again on follow-up visit.  6. Class 1 obesity due to excess calories with serious comorbidity and body mass  index (BMI) of 31.0 to 31.9 in adult Healthy weight maintenance discussed, and he has lost a little weight in the recent past. He is trying to eat healthy, and also modify quantities, and having some success. Continue to monitor  7. Acute prostatitis Concern for a possible mild prostatitis with some soreness in the suprapubic region noted, very little blood noted on the urine dip today, and some frequency at nighttime.  Exam was not markedly concerning with no fluctuant or abscess concerns. Felt best to add a Bactrim DS product-take 1 tablet twice daily for 14 days and assess his response. Also will send the urine for a UA and culture and sensitivity for completeness sake.  - sulfamethoxazole-trimethoprim (BACTRIM DS) 800-160 MG tablet; Take 1 tablet by mouth 2 (two) times daily for 14 days.  Dispense: 28 tablet; Refill: 0  Emphasized following up if symptoms not improving or more problematic over time, and tentatively scheduled to follow-up before the end of the year, in about 4 months time, follow-up sooner as needed.  Plan to recheck labs on that follow-up visit.     Towanda Malkin, MD 08/21/19 9:53 AM

## 2019-08-21 ENCOUNTER — Other Ambulatory Visit: Payer: Self-pay

## 2019-08-21 ENCOUNTER — Encounter: Payer: Self-pay | Admitting: Internal Medicine

## 2019-08-21 ENCOUNTER — Ambulatory Visit (INDEPENDENT_AMBULATORY_CARE_PROVIDER_SITE_OTHER): Payer: Medicare Other | Admitting: Internal Medicine

## 2019-08-21 VITALS — BP 124/74 | HR 58 | Temp 97.8°F | Resp 16 | Ht 72.0 in | Wt 229.0 lb

## 2019-08-21 DIAGNOSIS — M545 Low back pain, unspecified: Secondary | ICD-10-CM

## 2019-08-21 DIAGNOSIS — E785 Hyperlipidemia, unspecified: Secondary | ICD-10-CM

## 2019-08-21 DIAGNOSIS — N410 Acute prostatitis: Secondary | ICD-10-CM

## 2019-08-21 DIAGNOSIS — R399 Unspecified symptoms and signs involving the genitourinary system: Secondary | ICD-10-CM | POA: Diagnosis not present

## 2019-08-21 DIAGNOSIS — Z6831 Body mass index (BMI) 31.0-31.9, adult: Secondary | ICD-10-CM

## 2019-08-21 DIAGNOSIS — E6609 Other obesity due to excess calories: Secondary | ICD-10-CM

## 2019-08-21 DIAGNOSIS — I10 Essential (primary) hypertension: Secondary | ICD-10-CM | POA: Diagnosis not present

## 2019-08-21 DIAGNOSIS — R351 Nocturia: Secondary | ICD-10-CM | POA: Diagnosis not present

## 2019-08-21 LAB — POCT URINALYSIS DIPSTICK
Appearance: NORMAL
Bilirubin, UA: NEGATIVE
Glucose, UA: NEGATIVE
Ketones, UA: NEGATIVE
Leukocytes, UA: NEGATIVE
Nitrite, UA: NEGATIVE
Protein, UA: POSITIVE — AB
Spec Grav, UA: >=1.03 — AB (ref 1.010–1.025)
Urobilinogen, UA: 0.2 E.U./dL
pH, UA: 6 (ref 5.0–8.0)

## 2019-08-21 MED ORDER — SULFAMETHOXAZOLE-TRIMETHOPRIM 800-160 MG PO TABS: 1.0000 | ORAL_TABLET | Freq: Two times a day (BID) | ORAL | 0 refills | Status: AC

## 2019-08-21 MED ORDER — TAMSULOSIN HCL 0.4 MG PO CAPS: 0.8000 mg | ORAL_CAPSULE | Freq: Every day | ORAL | 3 refills | Status: DC

## 2019-08-21 NOTE — Patient Instructions (Signed)

## 2019-08-22 LAB — URINALYSIS, COMPLETE
Bacteria, UA: NONE SEEN /HPF
Bilirubin Urine: NEGATIVE
Glucose, UA: NEGATIVE
Hgb urine dipstick: NEGATIVE
Hyaline Cast: NONE SEEN /LPF
Leukocytes,Ua: NEGATIVE
Nitrite: NEGATIVE
RBC / HPF: NONE SEEN /HPF (ref 0–2)
Specific Gravity, Urine: 1.025 (ref 1.001–1.03)
Squamous Epithelial / HPF: NONE SEEN /HPF (ref <=5)
pH: 5.5 (ref 5.0–8.0)

## 2019-08-22 LAB — URINE CULTURE
MICRO NUMBER:: 10792603
Result:: NO GROWTH
SPECIMEN QUALITY:: ADEQUATE

## 2019-08-26 ENCOUNTER — Telehealth: Payer: Self-pay

## 2019-08-26 NOTE — Telephone Encounter (Signed)
Copied from Blake Rivera [phone]. Topic: General - Other >> Aug 25, 2019  2:49 PM [name] wrote: Reason for CRM: Pt returning Justyn Langham's call/ please advise

## 2019-08-27 NOTE — Telephone Encounter (Signed)
I spoke again with Blake Rivera and he will continue 14 day course of bactrim.

## 2019-08-27 NOTE — Telephone Encounter (Signed)
Patient called to speak to Blake Rivera stated that he needed to talk to her about a medication that was discussed previously. Please advise

## 2019-08-27 NOTE — Telephone Encounter (Signed)
I spoke with Blake Rivera and he wanted to know if he should continue Bactrim now that his culture was negative. Per Dr. Roxan Hockey note on 08/21/19   Concern for a possible mild prostatitis with some soreness in the suprapubic region noted, very little blood noted on the urine dip today, and some frequency at nighttime.  Exam was not markedly concerning with no fluctuant or abscess concerns. Felt best to add a Bactrim DS product-take 1 tablet twice daily for 14 days and assess his response. Also will send the urine for a UA and culture and sensitivity for completeness sake.  - sulfamethoxazole-trimethoprim (BACTRIM DS) 800-160 MG tablet; Take 1 tablet by mouth 2 (two) times daily for 14 days.  Dispense: 28 tablet; Refill: 0    I informed the patient I would confirm if he should discontinue.

## 2019-08-27 NOTE — Telephone Encounter (Signed)
No,  he should not stop the Bactrim. It is good news that the culture was negative, and there is no evidence for a bladder infection. The Bactrim product was started to help with a possible mild prostatitis component, and feel finishing the course is best presently to hopefully eradicate symptoms.

## 2019-10-09 ENCOUNTER — Ambulatory Visit (INDEPENDENT_AMBULATORY_CARE_PROVIDER_SITE_OTHER): Payer: Medicare Other

## 2019-10-09 ENCOUNTER — Other Ambulatory Visit: Payer: Self-pay

## 2019-10-09 DIAGNOSIS — Z23 Encounter for immunization: Secondary | ICD-10-CM | POA: Diagnosis not present

## 2019-10-09 NOTE — Progress Notes (Signed)
High dose flu vaccination given with no immediate adverse reactions noted

## 2019-11-28 DIAGNOSIS — H01001 Unspecified blepharitis right upper eyelid: Secondary | ICD-10-CM | POA: Diagnosis not present

## 2019-12-22 NOTE — Progress Notes (Signed)
Patient ID: Blake Rivera, male    DOB: [birth year], 85 y.o.   MRN: [id number]  PCP: Towanda Malkin, MD  Chief Complaint  Patient presents with  . Follow-up    Subjective:   Blake Rivera is a 85 y.o. male, presents to clinic with CC of the following:  Chief Complaint  Patient presents with  . Follow-up    HPI:  Patient is an 85 year old male Last visit by me was 08/21/2019 Follows up today.    he was seen November 13, 2018 by general surgery for a hernia concern, with the following impression/plan:             Discussed with the patient that based on his symptoms and exam, I do not think he has a recurrence of the right inguinal hernia. I do not feel any bulging. I do agree with his PCP that he has an umbilical hernia and diastasis recti. Discussed with the patient that if he wanted to be absolutely sure about any recurrence, we could do a CT scan of abdomen/pelvis to evaluate. Discussed with him that if he wanted, we can do watchful waiting at this point and evaluate any further symptoms, and if in the next month, nothing has improved, we can do a CT to evaluate further. He will go home and discuss his options with his family and get back to [place]. Otherwise, follow up as needed.  He notes things have been well in the recent past with no increased symptoms of concern. Still golfing 3 times a week, even with the weather little colder  LUTS/prostate cancer screening - frequency and nocturia with weaker stream noted prior, a trial of tamsulosin was initiated previously, noted was missing about 1 dose a week last visit Last visit increased the tamsulosin to 2 tablets daily and also treated for a mild prostatitis with Bactrim DS product for 14 days. Notes the increase tamsulosin product has been helpful.   Now up usually 2 times a night to urinate, occasionally 3 times, no hesitancy, no urgency, often slow stream, no hematuria, no soreness in bladder area noted  like had previously, no dysuria, and no frequency during the day, only at night.  Lab Results  Component Value Date   PSA1 2.2 11/25/2014   PSA1 1.5 08/26/2014   PSA 1.1 10/30/2018   PSA 1.1 07/03/2016   No FH prostate CA, agreed checking the PSAs about every 2 years, next one due in 2022.  Obesity/Prediabetes Wt Readings from Last 3 Encounters:  12/23/19 233 lb 8 oz (105.9 kg)  08/21/19 229 lb (103.9 kg)  01/23/19 236 lb 9.6 oz (107.3 kg)   Weight has remained stable Diet- trying to eat healthy Exercise- still plays golf 3X week, played yesterday, rides carts  Lab Results  Component Value Date   HGBA1C 6.2 (H) 10/30/2018   HGBA1C 6.3 07/03/2016   HGBA1C 6.3 (H) 08/26/2014   Lab Results  Component Value Date   LDLCALC 124 (H) 10/30/2018   CREATININE 1.01 10/30/2018   Hypertension:  Medication regimen-norvasc 10 mg Takes medication regularly Does not check blood pressures at home.  BP Readings from Last 3 Encounters:  12/23/19 140/75  08/21/19 124/74  01/23/19 104/66   Denies chest pain, palpitations, shortness of breath, at times a mild increased lower extremity swelling, increased headaches or vision changes  Hyperlipidemia: Medication Regimen: None, as he had an allergy to simvastatin (bad dreams) noted previously, tries to watch fats in diet  Last Lipids: Lab Results  Component Value Date   CHOL 190 10/30/2018   HDL 50 10/30/2018   LDLCALC 124 (H) 10/30/2018   TRIG 72 10/30/2018   CHOLHDL 3.8 10/30/2018   No dark or black stools,no BRBPR, no chronic abd pains      Patient Active Problem List   Diagnosis Date Noted  . Class 1 obesity due to excess calories with serious comorbidity and body mass index (BMI) of 31.0 to 31.9 in adult 08/21/2019  . Nocturia 08/21/2019  . Rotator cuff tendinitis, left 02/08/2018  . Traumatic complete tear of left rotator cuff 02/08/2018  . Localized, primary osteoarthritis 12/25/2017  . Lipoma of scalp 12/03/2017   . Obesity (BMI 30.0-34.9) 12/03/2017  . CKD (chronic kidney disease) stage 2, GFR 60-89 ml/min 12/03/2017  . Dyslipidemia 11/25/2014  . Abnormal ECG 07/16/2014  . History of colitis 07/16/2014  . ED (erectile dysfunction) 07/16/2014  . Anterior knee pain 07/16/2014  . Arthralgia of shoulder 07/16/2014  . Dyspnea 12/29/2013  . HTN (hypertension) 12/29/2013      Current Outpatient Medications:  .  amLODipine (NORVASC) 10 MG tablet, TAKE 1 TABLET BY MOUTH EVERY DAY, Disp: 90 tablet, Rfl: 0 .  cholecalciferol (VITAMIN D) 1000 units tablet, Take 1,000 Units by mouth daily., Disp: , Rfl:  .  Cod Liver Oil OIL, Take 1 tablet by mouth daily., Disp: , Rfl:  .  tamsulosin (FLOMAX) 0.4 MG CAPS capsule, Take 2 capsules (0.8 mg total) by mouth daily., Disp: 180 capsule, Rfl: 3   Allergies  Allergen Reactions  . Lisinopril     Angioedema  . Omacor  [Omega-3-Acid Ethyl Esters]     Other reaction(s): Rash  . Zocor  [Simvastatin]     Muscle Pain     Past Surgical History:  Procedure Laterality Date  . HERNIA REPAIR    . ROTATOR CUFF REPAIR  03/17/2018     Family History  Problem Relation Age of Onset  . Heart disease Mother   . Aneurysm Mother   . Heart disease Brother   . Emphysema Father   . Healthy Sister   . Healthy Brother   . Healthy Brother   . Heart disease Sister      Social History   Tobacco Use  . Smoking status: Former Smoker    Packs/day: 0.25    Years: 25.00    Pack years: 6.25    Types: Cigarettes    Quit date: 05/18/2014    Years since quitting: 5.6  . Smokeless tobacco: Never Used  . Tobacco comment: Former smoker - no need to provide with smoking cessation materials  Substance Use Topics  . Alcohol use: Yes    Alcohol/week: 1.0 standard drink    Types: 1 Shots of liquor per week    Comment: moderate - 1 whiskey shot per day    With staff assistance, above reviewed with the patient today.  ROS: As per HPI, otherwise no specific complaints on a  limited and focused system review   No results found for this or any previous visit (from the past 72 hour(s)).   PHQ2/9: Depression screen [facility] 2/9 12/23/2019 08/21/2019 01/23/2019 10/30/2018 12/25/2017  Decreased Interest 0 0 0 0 0  Down, Depressed, Hopeless 0 0 0 0 0  PHQ - 2 Score 0 0 0 0 0  Altered sleeping - 0 - 0 0  Tired, decreased energy - 0 - 0 0  Change in appetite - 0 - 0 0  Feeling  bad or failure about yourself  - 0 - 0 0  Trouble concentrating - 0 - 0 0  Moving slowly or fidgety/restless - 0 - 0 -  Suicidal thoughts - 0 - 0 0  PHQ-9 Score - 0 - 0 0  Difficult doing work/chores - Not difficult at all - Not difficult at all -   PHQ-2/9 Result is neg  Fall Risk: Fall Risk  12/23/2019 08/21/2019 01/23/2019 11/13/2018 10/30/2018  Falls in the past year? 0 0 0 0 0  Number falls in past yr: 0 0 0 - 0  Injury with Fall? 0 0 0 - 0  Risk for fall due to : - - No Fall Risks - -  Risk for fall due to: Comment - - - - -  Follow up - - Falls prevention discussed - -      Objective:   Vitals:   12/23/19 1005 12/23/19 1008  BP: (!) 150/88 140/75  Pulse: 60   Resp: 16   Temp: 98.1 F (36.7 C)   TempSrc: Oral   Weight: 233 lb 8 oz (105.9 kg)   Height: 6' (1.829 m)     Body mass index is 31.67 kg/m.   Recheck BP 132/72 left by me on the left Physical Exam   NAD, masked, pleasant HEENT - [place]/AT, sclera anicteric, PERRL, positive arcus, EOMI, conj -minimally injected bilateral, pharynx clear Neck - supple, no adenopathy, no TM, carotids 2+ and = without bruits bilat Car - RRR without m/g/r Pulm- RR and effort normal at rest, CTA without wheeze or rales Abd - soft, obese, NT diffusely, ND,  Back - no CVA tenderness,  Ext -trace LE edema,  with mild indentation present in the upper sock line bilateral, positive venous varicosities noted in the lower extremity, much more pronounced on the left lower extremity than the right. Neuro/psychiatric - affect was not flat, appropriate  with conversation      Alert       Grossly non-focal       Speech and gait are normal   Results for orders placed or performed in visit on 08/21/19  Urine Culture   Specimen: Urine  Result Value Ref Range   MICRO NUMBER: [id number]    SPECIMEN QUALITY: Adequate    Sample Source URINE    STATUS: FINAL    Result: No Growth   Urinalysis, Complete  Result Value Ref Range   Color, Urine YELLOW YELLOW   APPearance CLEAR CLEAR   Specific Gravity, Urine 1.025 1.001 - 1.03   pH 5.5 5.0 - 8.0   Glucose, UA NEGATIVE NEGATIVE   Bilirubin Urine NEGATIVE NEGATIVE   Ketones, ur TRACE (A) NEGATIVE   Hgb urine dipstick NEGATIVE NEGATIVE   Protein, ur TRACE (A) NEGATIVE   Nitrite NEGATIVE NEGATIVE   Leukocytes,Ua NEGATIVE NEGATIVE   WBC, UA 0-5 0 - 5 /HPF   RBC / HPF NONE SEEN 0 - 2 /HPF   Squamous Epithelial / LPF NONE SEEN < OR = 5 /HPF   Bacteria, UA NONE SEEN NONE SEEN /HPF   Hyaline Cast NONE SEEN NONE SEEN /LPF  POCT Urinalysis Dipstick  Result Value Ref Range   Color, UA Dark yellow    Clarity, UA slightly cloudy    Glucose, UA Negative Negative   Bilirubin, UA negative    Ketones, UA Negative    Spec Grav, UA >=1.030 (A) 1.010 - 1.025   Blood, UA Hemolyzed Trace    pH, UA 6.0 5.0 -  8.0   Protein, UA Positive (A) Negative   Urobilinogen, UA 0.2 0.2 or 1.0 E.U./dL   Nitrite, UA negative    Leukocytes, UA Negative Negative   Appearance Normal    Odor Strong    Prior labs reviewed    Assessment & Plan:   1. Lower urinary tract symptoms (LUTS)/2. Nocturia Notes doing well with the increase tamsulosin dose.  No concerns today for recurrence of any prostatitis symptoms. Continue the tamsulosin presently  3. Essential hypertension Blood pressure remains controlled on the Norvasc Continue that medication presently.  4.  Hyperlipidemia Reviewed last lipid panel, with goal of getting the LDL less than 100. Has not tolerated a statin previously. Continue with dietary  modifications, staying active Check lipid panel today  5. Class 1 obesity due to excess calories with serious comorbidity and body mass index (BMI) of 31.0 to 31.9 in adult Healthy weight maintenance discussed, and his weight has been very stable in the recent past. He is trying to eat healthy, and also modify quantities, and having some success. Continue to monitor  6.  Prediabetes We will recheck an A1c today  Await lab results from today, and tentatively, schedule follow-up in 6 months time, sooner as needed He was made aware that his follow-up will be with a different provider as I am leaving this practice sooner than that planned follow-up.    Towanda Malkin, MD 12/23/19 10:18 AM

## 2019-12-23 ENCOUNTER — Encounter: Payer: Self-pay | Admitting: Internal Medicine

## 2019-12-23 ENCOUNTER — Other Ambulatory Visit: Payer: Self-pay

## 2019-12-23 ENCOUNTER — Ambulatory Visit (INDEPENDENT_AMBULATORY_CARE_PROVIDER_SITE_OTHER): Payer: Medicare Other | Admitting: Internal Medicine

## 2019-12-23 VITALS — BP 140/75 | HR 60 | Temp 98.1°F | Resp 16 | Ht 72.0 in | Wt 233.5 lb

## 2019-12-23 DIAGNOSIS — R7303 Prediabetes: Secondary | ICD-10-CM | POA: Diagnosis not present

## 2019-12-23 DIAGNOSIS — Z6831 Body mass index (BMI) 31.0-31.9, adult: Secondary | ICD-10-CM

## 2019-12-23 DIAGNOSIS — E785 Hyperlipidemia, unspecified: Secondary | ICD-10-CM | POA: Diagnosis not present

## 2019-12-23 DIAGNOSIS — R399 Unspecified symptoms and signs involving the genitourinary system: Secondary | ICD-10-CM | POA: Diagnosis not present

## 2019-12-23 DIAGNOSIS — I10 Essential (primary) hypertension: Secondary | ICD-10-CM | POA: Diagnosis not present

## 2019-12-23 DIAGNOSIS — R351 Nocturia: Secondary | ICD-10-CM | POA: Diagnosis not present

## 2019-12-23 DIAGNOSIS — M545 Low back pain, unspecified: Secondary | ICD-10-CM

## 2019-12-23 DIAGNOSIS — E6609 Other obesity due to excess calories: Secondary | ICD-10-CM

## 2019-12-24 LAB — COMPLETE METABOLIC PANEL WITH GFR
AG Ratio: 1.7 (calc) (ref 1.0–2.5)
ALT: 10 U/L (ref 9–46)
AST: 13 U/L (ref 10–35)
Albumin: 4.3 g/dL (ref 3.6–5.1)
Alkaline phosphatase (APISO): 77 U/L (ref 35–144)
BUN: 11 mg/dL (ref 7–25)
CO2: 30 mmol/L (ref 20–32)
Calcium: 8.9 mg/dL (ref 8.6–10.3)
Chloride: 105 mmol/L (ref 98–110)
Creat: 1.11 mg/dL (ref 0.70–1.11)
GFR, Est African American: 71 mL/min/{1.73_m2} (ref >=60)
GFR, Est Non African American: 61 mL/min/{1.73_m2} (ref >=60)
Globulin: 2.6 g/dL (calc) (ref 1.9–3.7)
Glucose, Bld: 115 mg/dL — ABNORMAL HIGH (ref 65–99)
Potassium: 4.7 mmol/L (ref 3.5–5.3)
Sodium: 140 mmol/L (ref 135–146)
Total Bilirubin: 0.3 mg/dL (ref 0.2–1.2)
Total Protein: 6.9 g/dL (ref 6.1–8.1)

## 2019-12-24 LAB — LIPID PANEL
Cholesterol: 171 mg/dL (ref <200)
HDL: 48 mg/dL (ref >=40)
LDL Cholesterol (Calc): 104 mg/dL (calc) — ABNORMAL HIGH
Non-HDL Cholesterol (Calc): 123 mg/dL (calc) (ref <130)
Total CHOL/HDL Ratio: 3.6 (calc) (ref <5.0)
Triglycerides: 93 mg/dL (ref <150)

## 2019-12-24 LAB — HEMOGLOBIN A1C
Hgb A1c MFr Bld: 6 % of total Hgb — ABNORMAL HIGH (ref <5.7)
Mean Plasma Glucose: 126 mg/dL
eAG (mmol/L): 7 mmol/L

## 2020-01-23 DIAGNOSIS — H40003 Preglaucoma, unspecified, bilateral: Secondary | ICD-10-CM | POA: Diagnosis not present

## 2020-01-27 ENCOUNTER — Ambulatory Visit (INDEPENDENT_AMBULATORY_CARE_PROVIDER_SITE_OTHER): Payer: Medicare Other

## 2020-01-27 DIAGNOSIS — Z0000 Encounter for general adult medical examination without abnormal findings: Secondary | ICD-10-CM

## 2020-01-27 DIAGNOSIS — H40003 Preglaucoma, unspecified, bilateral: Secondary | ICD-10-CM | POA: Diagnosis not present

## 2020-01-27 NOTE — Progress Notes (Signed)
Subjective:   Blake Rivera is a 86 y.o. male who presents for Medicare Annual/Subsequent preventive examination.  Virtual Visit via Telephone Note  I connected with  Blake Rivera on 01/27/20 at  2:10 PM EST by telephone and verified that I am speaking with the correct person using two identifiers.  Location: Patient: home Provider: [place] Persons participating in the virtual visit: [place]   I discussed the limitations, risks, security and privacy concerns of performing an evaluation and management service by telephone and the availability of in person appointments. The patient expressed understanding and agreed to proceed.  Interactive audio and video telecommunications were attempted between this nurse and patient, however failed, due to patient having technical difficulties OR patient did not have access to video capability.  We continued and completed visit with audio only.  Some vital signs may be absent or patient reported.   Clemetine Marker, LPN    Review of Systems           Objective:    There were no vitals filed for this visit. There is no height or weight on file to calculate BMI.  Advanced Directives 01/27/2020 01/23/2019 12/25/2017 12/22/2016 08/22/2016 07/03/2016 02/07/2016  Does Patient Have a Medical Advance Directive? Yes Yes Yes Yes No No No  Type of Paramedic of [place];Living will [place];Living will [place];Living will [place];Living will - - -  Does patient want to make changes to medical advance directive? - - - - - - -  Copy of [place] in Chart? No - copy requested No - copy requested No - copy requested No - copy requested - - -  Would patient like information on creating a medical advance directive? - - - - - - -    Current Medications (verified) Outpatient Encounter Medications as of 01/27/2020  Medication Sig  .  amLODipine (NORVASC) 10 MG tablet TAKE 1 TABLET BY MOUTH EVERY DAY  . cholecalciferol (VITAMIN D) 1000 units tablet Take 1,000 Units by mouth daily.  . Cod Liver Oil OIL Take 1 tablet by mouth daily.  . tamsulosin (FLOMAX) 0.4 MG CAPS capsule Take 2 capsules (0.8 mg total) by mouth daily.  . vitamin B-12 (CYANOCOBALAMIN) 1000 MCG tablet Take 1,000 mcg by mouth every other day.   No facility-administered encounter medications on file as of 01/27/2020.    Allergies (verified) Lisinopril, Omacor  [omega-3-acid ethyl esters], and Zocor  [simvastatin]   History: Past Medical History:  Diagnosis Date  . Diverticulitis of colon   . ED (erectile dysfunction)   . Hyperglycemia   . Hypertension   . Vitamin D deficiency    Past Surgical History:  Procedure Laterality Date  . HERNIA REPAIR    . ROTATOR CUFF REPAIR  03/17/2018   Family History  Problem Relation Age of Onset  . Heart disease Mother   . Aneurysm Mother   . Heart disease Brother   . Emphysema Father   . Healthy Sister   . Healthy Brother   . Healthy Brother   . Heart disease Sister    Social History   Socioeconomic History  . Marital status: Married    Spouse name: Not on file  . Number of children: 2  . Years of education: Not on file  . Highest education level: Doctorate  Occupational History  . Occupation: Retired  Tobacco Use  . Smoking status: Former Smoker  Packs/day: 0.25    Years: 25.00    Pack years: 6.25    Types: Cigarettes    Quit date: 05/18/2014    Years since quitting: 5.6  . Smokeless tobacco: Never Used  . Tobacco comment: Former smoker - no need to provide with smoking cessation materials  Vaping Use  . Vaping Use: Never used  Substance and Sexual Activity  . Alcohol use: Yes    Alcohol/week: 1.0 standard drink    Types: 1 Shots of liquor per week    Comment: moderate - 1 whiskey shot per day  . Drug use: No  . Sexual activity: Yes  Other Topics Concern  . Not on file  Social  History Narrative  . Not on file   Social Determinants of Health   Financial Resource Strain: Low Risk   . Difficulty of Paying Living Expenses: Not hard at all  Food Insecurity: No Food Insecurity  . Worried About Charity fundraiser in the Last Year: Never true  . Ran Out of Food in the Last Year: Never true  Transportation Needs: No Transportation Needs  . Lack of Transportation (Medical): No  . Lack of Transportation (Non-Medical): No  Physical Activity: Sufficiently Active  . Days of Exercise per Week: 3 days  . Minutes of Exercise per Session: 60 min  Stress: No Stress Concern Present  . Feeling of Stress : Not at all  Social Connections: Socially Integrated  . Frequency of Communication with Friends and Family: More than three times a week  . Frequency of Social Gatherings with Friends and Family: Three times a week  . Attends Religious Services: More than 4 times per year  . Active Member of Clubs or Organizations: Yes  . Attends Archivist Meetings: More than 4 times per year  . Marital Status: Married    Tobacco Counseling Counseling given: Not Answered Comment: Former smoker - no need to provide with smoking cessation materials   Clinical Intake:  Pre-visit preparation completed: Yes  Pain : No/denies pain     Nutritional Risks: None Diabetes: No  How often do you need to have someone help you when you read instructions, pamphlets, or other written materials from your doctor or pharmacy?: 1 - Never    Interpreter Needed?: No  Information entered by :: Clemetine Marker LPN   Activities of Daily Living In your present state of health, do you have any difficulty performing the following activities: 12/23/2019 08/21/2019  Hearing? N N  Vision? N N  Difficulty concentrating or making decisions? N N  Walking or climbing stairs? Y N  Dressing or bathing? N N  Doing errands, shopping? N N  Some recent data might be hidden    Patient Care  Team: Delsa Grana, PA-C as PCP - General (Family Medicine)  Indicate any recent Medical Services you may have received from other than [facility] providers in the past year (date may be approximate).     Assessment:   This is a routine wellness examination for [place].  Hearing/Vision screen  Hearing Screening   125Hz  250Hz  500Hz  1000Hz  2000Hz  3000Hz  4000Hz  6000Hz  8000Hz   Right ear:           Left ear:           Vision Screening Comments: [facility] screenings at [facility]   Dietary issues and exercise activities discussed:    Goals    . DIET - INCREASE WATER INTAKE     Recommend 6-8 glasses  of water per day.      Depression Screen PHQ 2/9 Scores 01/27/2020 12/23/2019 08/21/2019 01/23/2019 10/30/2018 12/25/2017 12/03/2017  PHQ - 2 Score 0 0 0 0 0 0 0  PHQ- 9 Score - - 0 - 0 0 0    Fall Risk Fall Risk  01/27/2020 12/23/2019 08/21/2019 01/23/2019 11/13/2018  Falls in the past year? 0 0 0 0 0  Number falls in past yr: 0 0 0 0 -  Injury with Fall? 0 0 0 0 -  Risk for fall due to : No Fall Risks - - No Fall Risks -  Risk for fall due to: Comment - - - - -  Follow up Falls prevention discussed - - Falls prevention discussed -    FALL RISK PREVENTION PERTAINING TO THE HOME:  Any stairs in or around the home? Yes  If so, are there any without handrails? No  Home free of loose throw rugs in walkways, pet beds, electrical cords, etc? Yes  Adequate lighting in your home to reduce risk of falls? Yes   ASSISTIVE DEVICES UTILIZED TO PREVENT FALLS:  Life alert? No  Use of a cane, walker or w/c? No  Grab bars in the bathroom? Yes  Shower chair or bench in shower? No  Elevated toilet seat or a handicapped toilet? Yes   TIMED UP AND GO:  Was the test performed? No . telephonic visit.  Cognitive Function: Normal cognitive status assessed by direct observation by this Nurse Health Advisor. No abnormalities found.       6CIT Screen 12/25/2017 12/22/2016  What Year? 0 points 0  points  What month? 0 points 0 points  What time? 0 points 0 points  Count back from 20 0 points 0 points  Months in reverse 0 points 0 points  Repeat phrase 2 points 0 points  Total Score 2 0    Immunizations Immunization History  Administered Date(s) Administered  . Fluad Quad(high Dose 65+) 10/30/2018, 10/09/2019  . Influenza Split 12/02/2013  . Influenza, High Dose Seasonal PF 10/24/2016, 10/16/2017  . Influenza, Seasonal, Injecte, Preservative Fre 11/11/2012  . Influenza-Unspecified 12/06/2014, 10/25/2015  . PFIZER SARS-COV-2 Vaccination 01/23/2019, 02/15/2019  . Tdap 01/17/2011    TDAP status: Up to date  Flu Vaccine status: Up to date  Pneumococcal vaccine status: Declined,  Education has been provided regarding the importance of this vaccine but patient still declined. Advised may receive this vaccine at local pharmacy or Health Dept. Aware to provide a copy of the vaccination record if obtained from local pharmacy or Health Dept. Verbalized acceptance and understanding.   Covid-19 vaccine status: Completed vaccines  Qualifies for Shingles Vaccine? Yes   Zostavax completed No   Shingrix Completed?: No.    Education has been provided regarding the importance of this vaccine. Patient has been advised to call insurance company to determine out of pocket expense if they have not yet received this vaccine. Advised may also receive vaccine at local pharmacy or Health Dept. Verbalized acceptance and understanding.  Screening Tests Health Maintenance  Topic Date Due  . COVID-19 Vaccine (3 - Booster for Pfizer series) 08/15/2019  . PNA vac Low Risk Adult (1 of 2 - PCV13) 08/20/2020 (Originally 01/30/2001)  . TETANUS/TDAP  01/16/2021  . INFLUENZA VACCINE  Completed    Health Maintenance  Health Maintenance Due  Topic Date Due  . COVID-19 Vaccine (3 - Booster for Pfizer series) 08/15/2019    Colorectal cancer screening: No longer required.   Lung  Cancer Screening: (Low  Dose CT Chest recommended if Age 44-80 years, 30 pack-year currently smoking OR have quit w/in 15years.) does not qualify.    Additional Screening:  Hepatitis C Screening: does not qualify  Vision Screening: Recommended annual ophthalmology exams for early detection of glaucoma and other disorders of the eye. Is the patient up to date with their annual eye exam?  Yes  Who is the provider or what is the name of the office in which the patient attends annual eye exams? [place] Screening: Recommended annual dental exams for proper oral hygiene  Community Resource Referral / Chronic Care Management: CRR required this visit?  No   CCM required this visit?  No      Plan:     I have personally reviewed and noted the following in the patient's chart:   . Medical and social history . Use of alcohol, tobacco or illicit drugs  . Current medications and supplements . Functional ability and status . Nutritional status . Physical activity . Advanced directives . List of other physicians . Hospitalizations, surgeries, and ER visits in previous 12 months . Vitals . Screenings to include cognitive, depression, and falls . Referrals and appointments  In addition, I have reviewed and discussed with patient certain preventive protocols, quality metrics, and best practice recommendations. A written personalized care plan for preventive services as well as general preventive health recommendations were provided to patient.     Clemetine Marker, LPN   [id number]   Nurse Notes: none

## 2020-02-23 ENCOUNTER — Telehealth: Payer: Self-pay

## 2020-02-23 DIAGNOSIS — I10 Essential (primary) hypertension: Secondary | ICD-10-CM

## 2020-02-23 NOTE — Telephone Encounter (Signed)
Pt was [facility] patient and he has enough BP meds to do him the rest of this week 2- 7 -2022 week and he needs a refill. Pharm is [facility] on [address].

## 2020-02-24 MED ORDER — AMLODIPINE BESYLATE 10 MG PO TABS: 10.0000 mg | ORAL_TABLET | Freq: Every day | ORAL | 3 refills | Status: DC

## 2020-02-24 NOTE — Telephone Encounter (Signed)
Pt has been added to the wait list.

## 2020-03-16 IMAGING — MR MR SHOULDER*L* W/O CM
4 of 5 series · 30 of 40 positions shown · non-contrast
Comparison: None.

CLINICAL DATA: Left shoulder pain with painful and limited range of
motion.

EXAM:
MRI OF THE LEFT SHOULDER WITHOUT CONTRAST
TECHNIQUE: Multiplanar, multisequence MR imaging of the shoulder was performed.
No intravenous contrast was administered.

[Series 5: PD fat-sat · axial · left · 4.0mm · 0.55mm/px · z∈[-38,+101]mm · 8 of 30 slices shown (1 of 2)]
[im 1/30]
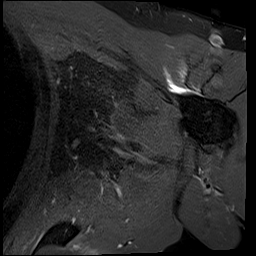
[im 4/30]
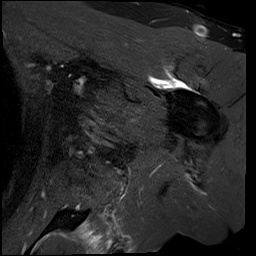
[im 10/30]
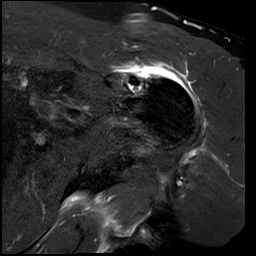
[im 13/30]
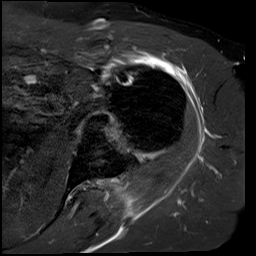
[im 17/30]
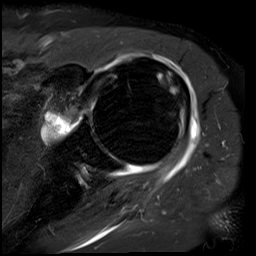
[im 20/30]
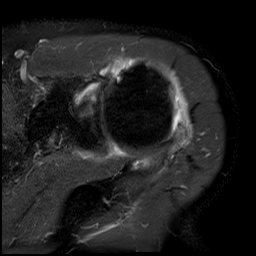
[im 26/30]
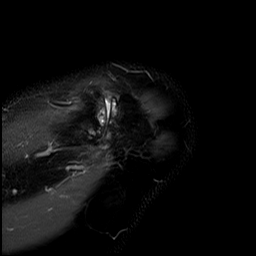
[im 30/30]
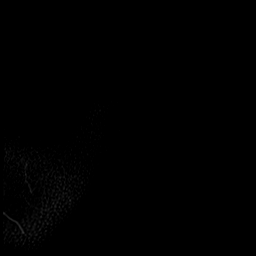

[Series 6: PD fat-sat · oblique · left · 4.0mm · 0.44mm/px · 7 of 24 slices shown (2 of 2)]
[im 1/24]
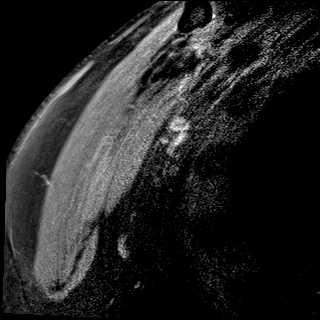
[im 4/24]
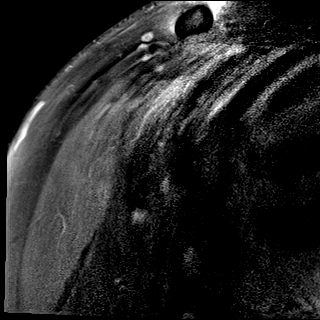
[im 8/24]
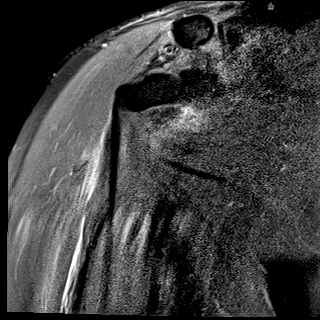
[im 12/24]
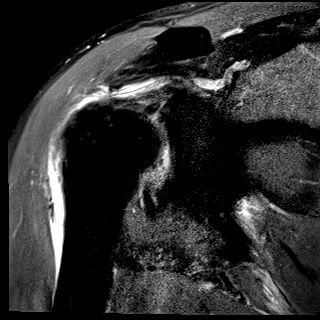
[im 16/24]
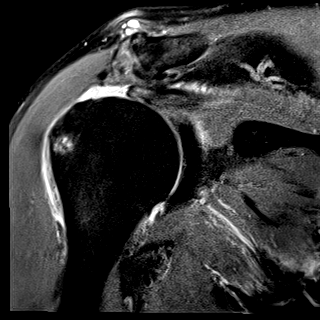
[im 20/24]
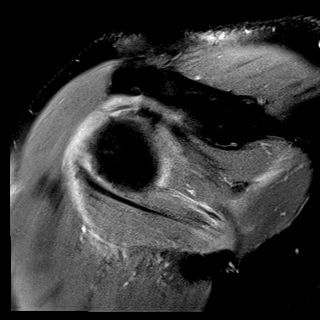
[im 24/24]
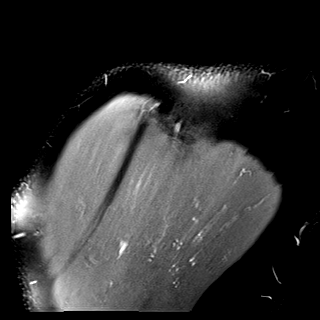

[Series 7: T2 fat-sat · oblique · left · 4.0mm · 0.44mm/px · 7 of 24 slices shown (1 of 2)]
[im 1/24]
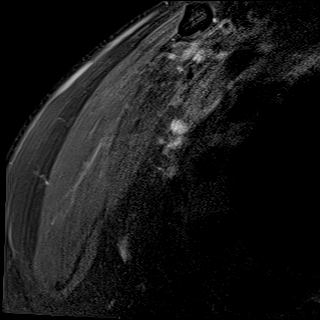
[im 4/24]
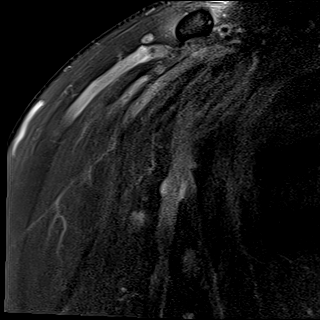
[im 8/24]
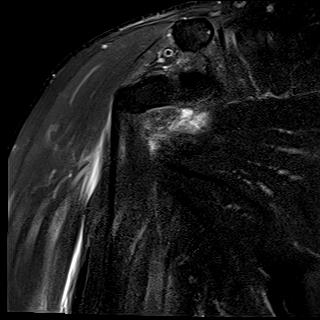
[im 12/24]
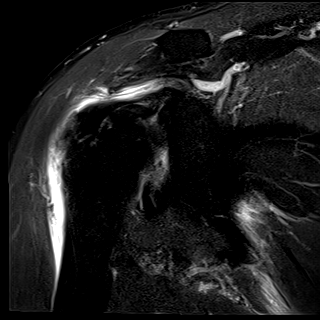
[im 16/24]
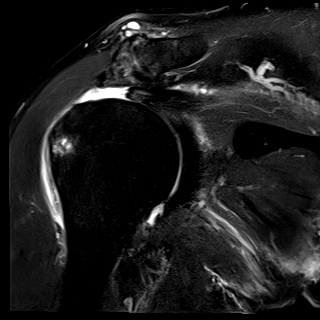
[im 20/24]
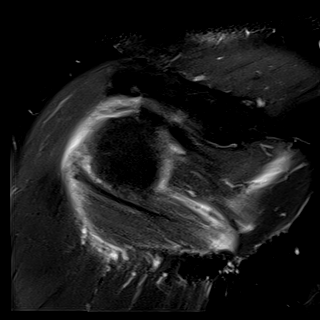
[im 24/24]
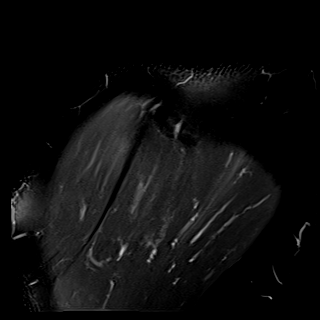

[Series 10: T2 fat-sat · oblique · left · 4.0mm · 0.23mm/px · 8 of 27 slices shown (2 of 2)]
[im 1/27]
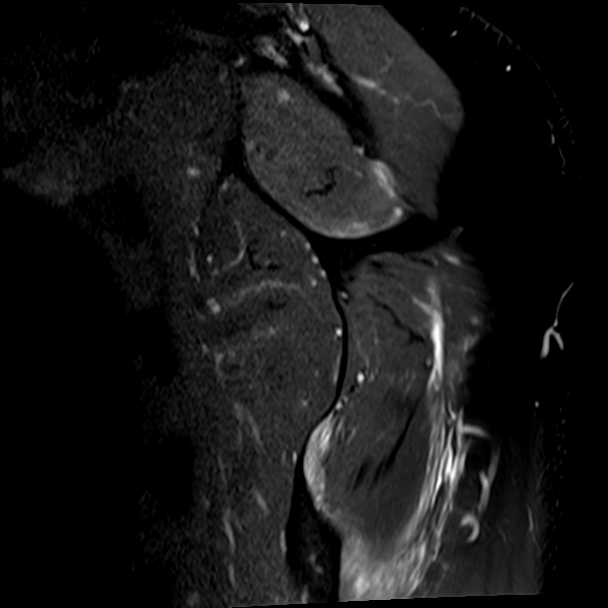
[im 4/27]
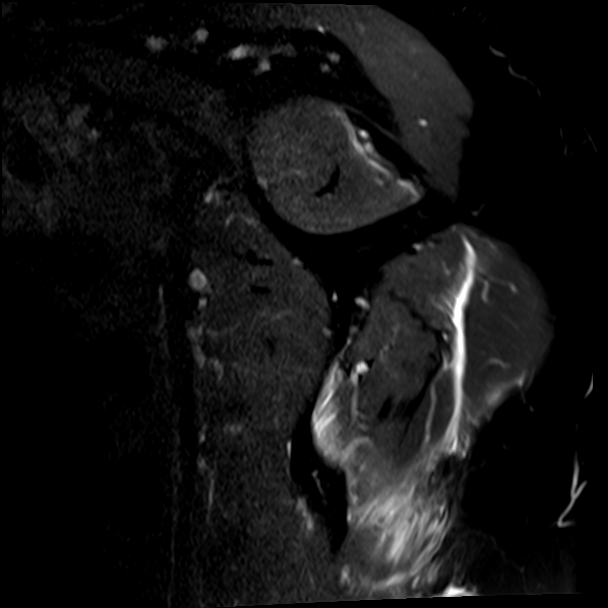
[im 8/27]
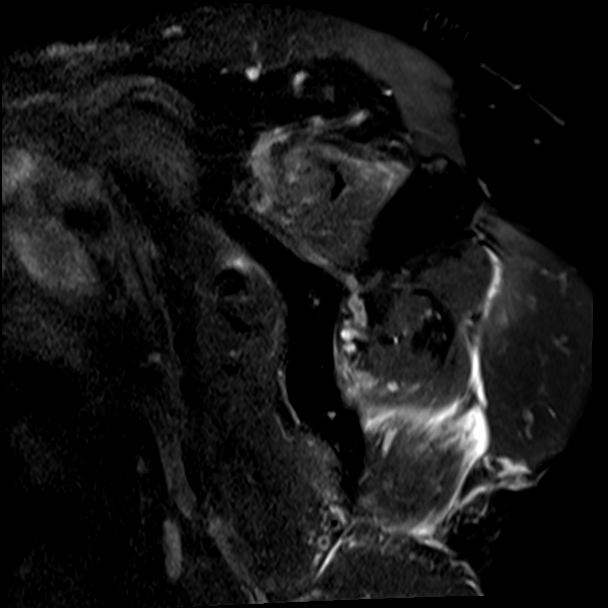
[im 12/27]
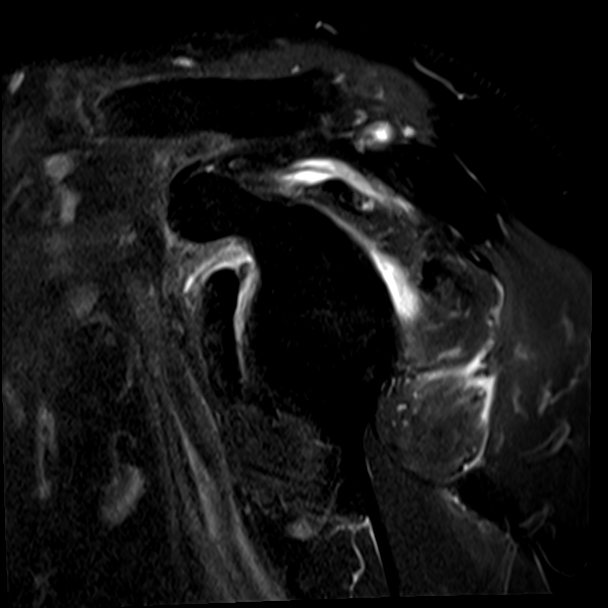
[im 15/27]
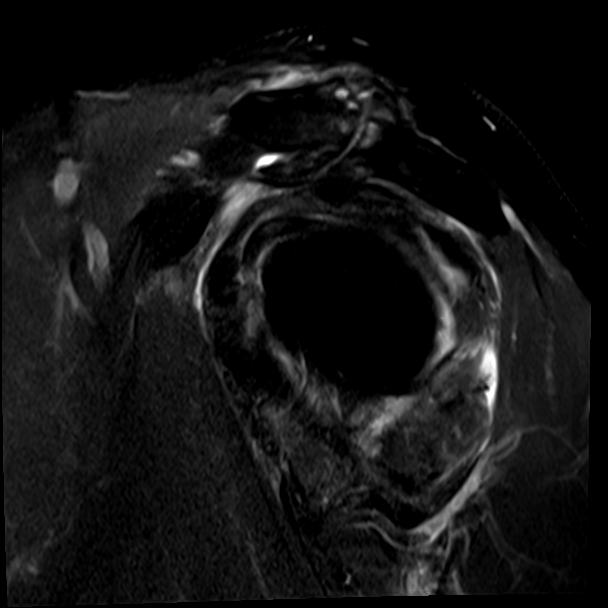
[im 19/27]
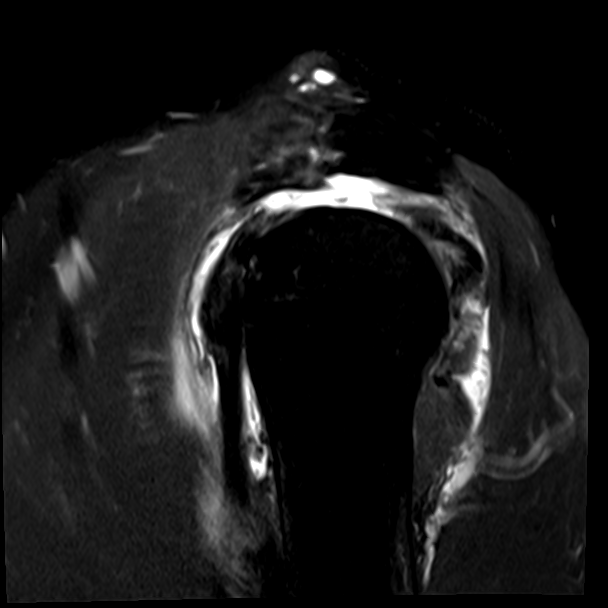
[im 23/27]
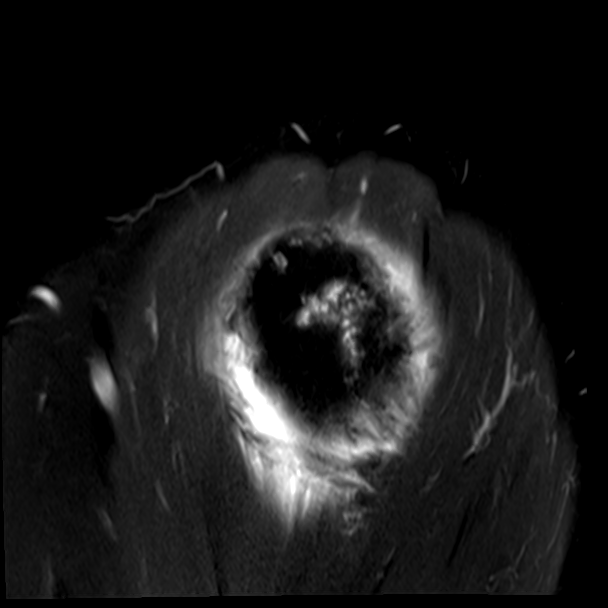
[im 27/27]
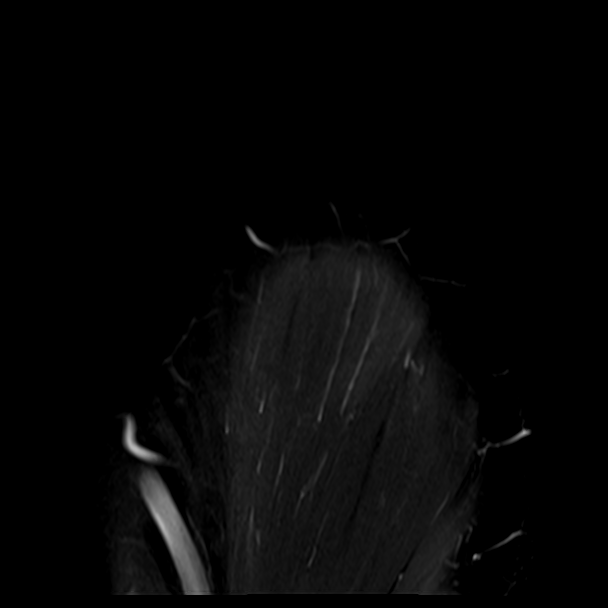

[30 of 40 positions shown; findings below may reference images not displayed]

FINDINGS: Rotator cuff: There are full-thickness retracted tears of the distal
infraspinatus and supraspinatus tendons. The defect is approximately
5 x 4 cm. The subscapularis and teres minor tendons are intact.

Muscles: There is no atrophy. However, there is prominent edema
around the teres minor and infraspinatus muscles with slight edema
around the supraspinatus muscle. Slight atrophy of the teres minor
muscle.

Biceps long head:  Properly located and intact.

Acromioclavicular Joint: Prominent AC joint arthropathy. Prominent
inferior osteophytes at the AC joint could predispose to
impingement. Type 3 acromion.

Glenohumeral Joint: Moderate glenohumeral joint effusion. No
discrete chondral defect.

Labrum:  Intact.

Bones: Focal degenerative changes in the greater tuberosity of the
proximal humerus.

Other: None
IMPRESSION: 1. Large full-thickness retracted tears of the infraspinatus and
supraspinatus tendons.
2. Hypertrophied AC joint which could predispose to impingement.
3. Moderate glenohumeral joint effusion with prominent fluid in the
subacromial/subdeltoid bursae with fluid and edema in the muscles of
the rotator cuff as described.

## 2020-04-19 DIAGNOSIS — M1712 Unilateral primary osteoarthritis, left knee: Secondary | ICD-10-CM | POA: Diagnosis not present

## 2020-06-01 DIAGNOSIS — M1712 Unilateral primary osteoarthritis, left knee: Secondary | ICD-10-CM | POA: Diagnosis not present

## 2020-06-18 ENCOUNTER — Other Ambulatory Visit: Payer: Self-pay | Admitting: Family Medicine

## 2020-06-18 DIAGNOSIS — I10 Essential (primary) hypertension: Secondary | ICD-10-CM

## 2020-06-18 NOTE — Telephone Encounter (Signed)
Pt needs to sch appt

## 2020-07-15 ENCOUNTER — Encounter: Payer: Self-pay | Admitting: Unknown Physician Specialty

## 2020-07-15 ENCOUNTER — Other Ambulatory Visit: Payer: Self-pay

## 2020-07-15 ENCOUNTER — Ambulatory Visit (INDEPENDENT_AMBULATORY_CARE_PROVIDER_SITE_OTHER): Payer: Medicare Other | Admitting: Unknown Physician Specialty

## 2020-07-15 DIAGNOSIS — R351 Nocturia: Secondary | ICD-10-CM

## 2020-07-15 DIAGNOSIS — E785 Hyperlipidemia, unspecified: Secondary | ICD-10-CM

## 2020-07-15 DIAGNOSIS — R399 Unspecified symptoms and signs involving the genitourinary system: Secondary | ICD-10-CM

## 2020-07-15 DIAGNOSIS — I10 Essential (primary) hypertension: Secondary | ICD-10-CM

## 2020-07-15 MED ORDER — AMLODIPINE BESYLATE 10 MG PO TABS: 10.0000 mg | ORAL_TABLET | Freq: Every day | ORAL | 1 refills | Status: DC

## 2020-07-15 MED ORDER — TAMSULOSIN HCL 0.4 MG PO CAPS: 0.8000 mg | ORAL_CAPSULE | Freq: Every day | ORAL | 3 refills | Status: DC

## 2020-07-15 NOTE — Assessment & Plan Note (Signed)
Improved with Flomax

## 2020-07-15 NOTE — Assessment & Plan Note (Signed)
Stable, continue present medications.   

## 2020-07-15 NOTE — Assessment & Plan Note (Signed)
LDL is less than 110.  ? Risk benefit of statin given intolerance and age

## 2020-07-15 NOTE — Progress Notes (Signed)
BP 138/72   Pulse 66   Temp 98.1 F (36.7 C)   Resp 16   Ht 6' (1.829 m)   Wt 225 lb 4.8 oz (102.2 kg)   SpO2 97%   BMI 30.56 kg/m    Subjective:    Patient ID: Blake Rivera, male    DOB: February 23, 1934, 86 y.o.   MRN: [id number]  HPI: Blake Rivera is a 86 y.o. male  Chief Complaint  Patient presents with   Follow-up   Medication Refill   Hypertension Using medications without difficulty Average home BPs   No problems or lightheadedness No chest pain with exertion or shortness of breath No Edema  Hyperlipidemia Not on statin as intolerant.     Relevant past medical, surgical, family and social history reviewed and updated as indicated. Interim medical history since our last visit reviewed. Allergies and medications reviewed and updated.  Review of Systems  Constitutional: Negative.   HENT: Negative.    Eyes: Negative.   Respiratory: Negative.    Cardiovascular: Negative.   Gastrointestinal: Negative.   Endocrine: Negative.   Genitourinary: Negative.        Taking Flomax with stablesymptoms  Skin: Negative.   Allergic/Immunologic: Negative.   Neurological: Negative.   Hematological: Negative.   Psychiatric/Behavioral: Negative.     Per HPI unless specifically indicated above     Objective:    BP 138/72   Pulse 66   Temp 98.1 F (36.7 C)   Resp 16   Ht 6' (1.829 m)   Wt 225 lb 4.8 oz (102.2 kg)   SpO2 97%   BMI 30.56 kg/m   Wt Readings from Last 3 Encounters:  07/15/20 225 lb 4.8 oz (102.2 kg)  12/23/19 233 lb 8 oz (105.9 kg)  08/21/19 229 lb (103.9 kg)    Physical Exam Constitutional:      General: He is not in acute distress.    Appearance: Normal appearance. He is well-developed.  HENT:     Head: Normocephalic and atraumatic.  Eyes:     General: Lids are normal. No scleral icterus.       Right eye: No discharge.        Left eye: No discharge.     Conjunctiva/sclera: Conjunctivae normal.  Cardiovascular:     Rate and Rhythm:  Normal rate and regular rhythm.  Pulmonary:     Effort: Pulmonary effort is normal.  Abdominal:     Palpations: There is no hepatomegaly or splenomegaly.  Musculoskeletal:        General: Normal range of motion.  Skin:    Coloration: Skin is not pale.     Findings: No rash.  Neurological:     Mental Status: He is alert and oriented to person, place, and time.  Psychiatric:        Behavior: Behavior normal.        Thought Content: Thought content normal.        Judgment: Judgment normal.    Results for orders placed or performed in visit on 12/23/19  Hemoglobin A1c  Result Value Ref Range   Hgb A1c MFr Bld 6.0 (H) <5.7 % of total Hgb   Mean Plasma Glucose 126 mg/dL   eAG (mmol/L) 7.0 mmol/L  COMPLETE METABOLIC PANEL WITH GFR  Result Value Ref Range   Glucose, Bld 115 (H) 65 - 99 mg/dL   BUN 11 7 - 25 mg/dL   Creat 1.11 0.70 - 1.11 mg/dL   GFR, Est Non  African American 61 > OR = 60 mL/min/1.30m2   GFR, Est African American 71 > OR = 60 mL/min/1.17m2   BUN/Creatinine Ratio NOT APPLICABLE 6 - 22 (calc)   Sodium 140 135 - 146 mmol/L   Potassium 4.7 3.5 - 5.3 mmol/L   Chloride 105 98 - 110 mmol/L   CO2 30 20 - 32 mmol/L   Calcium 8.9 8.6 - 10.3 mg/dL   Total Protein 6.9 6.1 - 8.1 g/dL   Albumin 4.3 3.6 - 5.1 g/dL   Globulin 2.6 1.9 - 3.7 g/dL (calc)   AG Ratio 1.7 1.0 - 2.5 (calc)   Total Bilirubin 0.3 0.2 - 1.2 mg/dL   Alkaline phosphatase (APISO) 77 35 - 144 U/L   AST 13 10 - 35 U/L   ALT 10 9 - 46 U/L  Lipid panel  Result Value Ref Range   Cholesterol 171 <200 mg/dL   HDL 48 > OR = 40 mg/dL   Triglycerides 93 <150 mg/dL   LDL Cholesterol (Calc) 104 (H) mg/dL (calc)   Total CHOL/HDL Ratio 3.6 <5.0 (calc)   Non-HDL Cholesterol (Calc) 123 <130 mg/dL (calc)      Assessment & Plan:   Problem List Items Addressed This Visit       Unprioritized   Dyslipidemia (Chronic)    LDL is less than 110.  ? Risk benefit of statin given intolerance and age       HTN  (hypertension) (Chronic)    Stable, continue present medications.         Relevant Medications   amLODipine (NORVASC) 10 MG tablet   Nocturia    Improved with Flomax       Relevant Medications   tamsulosin (FLOMAX) 0.4 MG CAPS capsule   Other Visit Diagnoses     Lower urinary tract symptoms (LUTS)       Trial of Flomax, PSA per patient request, likely have him follow-up with urology to assess prostate/BPH   Relevant Medications   tamsulosin (FLOMAX) 0.4 MG CAPS capsule   Essential hypertension       Relevant Medications   amLODipine (NORVASC) 10 MG tablet        Follow up plan: Return in about 6 months (around 01/14/2021).

## 2020-07-27 DIAGNOSIS — H40003 Preglaucoma, unspecified, bilateral: Secondary | ICD-10-CM | POA: Diagnosis not present

## 2020-08-12 ENCOUNTER — Ambulatory Visit: Payer: Medicare Other

## 2020-10-26 ENCOUNTER — Other Ambulatory Visit: Payer: Self-pay

## 2020-10-26 ENCOUNTER — Ambulatory Visit (INDEPENDENT_AMBULATORY_CARE_PROVIDER_SITE_OTHER): Payer: Medicare Other

## 2020-10-26 DIAGNOSIS — Z23 Encounter for immunization: Secondary | ICD-10-CM

## 2021-01-07 ENCOUNTER — Other Ambulatory Visit: Payer: Self-pay | Admitting: Family Medicine

## 2021-01-07 DIAGNOSIS — I10 Essential (primary) hypertension: Secondary | ICD-10-CM

## 2021-01-07 NOTE — Telephone Encounter (Signed)
Requested Prescriptions  Pending Prescriptions Disp Refills   amLODipine (NORVASC) 10 MG tablet [Pharmacy Med Name: AMLODIPINE BESYLATE 10 MG TAB] 90 tablet 0    Sig: TAKE 1 TABLET BY MOUTH EVERY DAY     Cardiovascular:  Calcium Channel Blockers Passed - 01/07/2021  1:31 AM      Passed - Last BP in normal range    BP Readings from Last 1 Encounters:  07/15/20 138/72         Passed - Valid encounter within last 6 months    Recent Outpatient Visits          5 months ago Primary hypertension   [facility] Kathrine Haddock, NP   1 year ago Lower urinary tract symptoms (LUTS)   [facility] Towanda Malkin, MD   1 year ago Lower urinary tract symptoms (LUTS)   [place], MD   2 years ago Lower urinary tract symptoms (LUTS)   [facility] Delsa Grana, PA-C   3 years ago Chronic left shoulder pain   [facility] Lada, Satira Anis, MD      Future Appointments            In 1 week Reece Packer, Myna Hidalgo, [facility], [place]   In 2 weeks  [place]

## 2021-01-14 ENCOUNTER — Ambulatory Visit: Payer: Medicare Other | Admitting: Nurse Practitioner

## 2021-01-18 ENCOUNTER — Ambulatory Visit (INDEPENDENT_AMBULATORY_CARE_PROVIDER_SITE_OTHER): Payer: Medicare Other | Admitting: Nurse Practitioner

## 2021-01-18 ENCOUNTER — Encounter: Payer: Self-pay | Admitting: Nurse Practitioner

## 2021-01-18 VITALS — BP 132/78 | HR 64 | Temp 97.6°F | Resp 18 | Ht 72.0 in | Wt 226.9 lb

## 2021-01-18 DIAGNOSIS — R7303 Prediabetes: Secondary | ICD-10-CM

## 2021-01-18 DIAGNOSIS — E785 Hyperlipidemia, unspecified: Secondary | ICD-10-CM

## 2021-01-18 DIAGNOSIS — R399 Unspecified symptoms and signs involving the genitourinary system: Secondary | ICD-10-CM | POA: Diagnosis not present

## 2021-01-18 DIAGNOSIS — Z79899 Other long term (current) drug therapy: Secondary | ICD-10-CM

## 2021-01-18 DIAGNOSIS — R351 Nocturia: Secondary | ICD-10-CM

## 2021-01-18 DIAGNOSIS — I10 Essential (primary) hypertension: Secondary | ICD-10-CM

## 2021-01-18 MED ORDER — AMLODIPINE BESYLATE 10 MG PO TABS: 10.0000 mg | ORAL_TABLET | Freq: Every day | ORAL | 3 refills | Status: DC

## 2021-01-18 MED ORDER — TAMSULOSIN HCL 0.4 MG PO CAPS: 0.8000 mg | ORAL_CAPSULE | Freq: Every day | ORAL | 3 refills | Status: DC

## 2021-01-18 NOTE — Progress Notes (Signed)
BP 132/78 (BP Location: Right Arm, Patient Position: Sitting, Cuff Size: Normal)    Pulse 64    Temp 97.6 F (36.4 C) (Oral)    Resp 18    Ht 6' (1.829 m) Comment: per patient   Wt 226 lb 14.4 oz (102.9 kg)    SpO2 99%    BMI 30.77 kg/m    Subjective:    Patient ID: Blake Rivera, male    DOB: 1934/03/21, 87 y.o.   MRN: [id number]  HPI: Blake Rivera is a 87 y.o. male, here alone  Chief Complaint  Patient presents with   Follow-up   Hypertension    HTN: He is currently on amlodipine 10 mg daily.  He says he does not regularly check his blood pressure.  He says when he does it is in the 120s/70s.  His blood pressure today is 132/78.  He denies any chest pain, shortness of breath, headaches or blurred vision.   Hyperlipidemia: Last LDL was 104 on 12/23/19. He is not currently on any statins. He says he eats a healthy diet and stays physically active.  Will get labs today.  Nocturia/LUTS: He says he has been taking Flomax since 08/21/19.  He says he was having frequency and nocturia with a weaker stream.  He says symptoms are much improved with Flomax.  He says he can tell when he misses a dose.   Relevant past medical, surgical, family and social history reviewed and updated as indicated. Interim medical history since our last visit reviewed. Allergies and medications reviewed and updated.  Review of Systems  Constitutional: Negative for fever or weight change.  Respiratory: Negative for cough and shortness of breath.   Cardiovascular: Negative for chest pain or palpitations.  Gastrointestinal: Negative for abdominal pain, no bowel changes.  Musculoskeletal: Negative for gait problem or joint swelling.  Skin: Negative for rash.  Neurological: Negative for dizziness or headache.  No other specific complaints in a complete review of systems (except as listed in HPI above).      Objective:    BP 132/78 (BP Location: Right Arm, Patient Position: Sitting, Cuff Size: Normal)     Pulse 64    Temp 97.6 F (36.4 C) (Oral)    Resp 18    Ht 6' (1.829 m) Comment: per patient   Wt 226 lb 14.4 oz (102.9 kg)    SpO2 99%    BMI 30.77 kg/m   Wt Readings from Last 3 Encounters:  01/18/21 226 lb 14.4 oz (102.9 kg)  07/15/20 225 lb 4.8 oz (102.2 kg)  12/23/19 233 lb 8 oz (105.9 kg)    Physical Exam  Constitutional: Patient appears well-developed and well-nourished. Overweight, No distress.  HEENT: head atraumatic, normocephalic, pupils equal and reactive to light,  neck supple Cardiovascular: Normal rate, regular rhythm and normal heart sounds.  No murmur heard. No BLE edema. Pulmonary/Chest: Effort normal and breath sounds normal. No respiratory distress. Abdominal: Soft.  There is no tenderness. Psychiatric: Patient has a normal mood and affect. behavior is normal. Judgment and thought content normal.   Results for orders placed or performed in visit on 12/23/19  Hemoglobin A1c  Result Value Ref Range   Hgb A1c MFr Bld 6.0 (H) <5.7 % of total Hgb   Mean Plasma Glucose 126 mg/dL   eAG (mmol/L) 7.0 mmol/L  COMPLETE METABOLIC PANEL WITH GFR  Result Value Ref Range   Glucose, Bld 115 (H) 65 - 99 mg/dL   BUN 11 7 -  25 mg/dL   Creat 1.11 0.70 - 1.11 mg/dL   GFR, Est Non African American 61 > OR = 60 mL/min/1.22m2   GFR, Est African American 71 > OR = 60 mL/min/1.64m2   BUN/Creatinine Ratio NOT APPLICABLE 6 - 22 (calc)   Sodium 140 135 - 146 mmol/L   Potassium 4.7 3.5 - 5.3 mmol/L   Chloride 105 98 - 110 mmol/L   CO2 30 20 - 32 mmol/L   Calcium 8.9 8.6 - 10.3 mg/dL   Total Protein 6.9 6.1 - 8.1 g/dL   Albumin 4.3 3.6 - 5.1 g/dL   Globulin 2.6 1.9 - 3.7 g/dL (calc)   AG Ratio 1.7 1.0 - 2.5 (calc)   Total Bilirubin 0.3 0.2 - 1.2 mg/dL   Alkaline phosphatase (APISO) 77 35 - 144 U/L   AST 13 10 - 35 U/L   ALT 10 9 - 46 U/L  Lipid panel  Result Value Ref Range   Cholesterol 171 <200 mg/dL   HDL 48 > OR = 40 mg/dL   Triglycerides 93 <150 mg/dL   LDL Cholesterol  (Calc) 104 (H) mg/dL (calc)   Total CHOL/HDL Ratio 3.6 <5.0 (calc)   Non-HDL Cholesterol (Calc) 123 <130 mg/dL (calc)      Assessment & Plan:   1. Essential hypertension  - CBC with Differential/Platelet - COMPLETE METABOLIC PANEL WITH GFR - amLODipine (NORVASC) 10 MG tablet; Take 1 tablet (10 mg total) by mouth daily.  Dispense: 90 tablet; Refill: 3  2. Dyslipidemia  - Lipid panel - COMPLETE METABOLIC PANEL WITH GFR  3. Lower urinary tract symptoms (LUTS)  - tamsulosin (FLOMAX) 0.4 MG CAPS capsule; Take 2 capsules (0.8 mg total) by mouth daily.  Dispense: 180 capsule; Refill: 3  4. Nocturia  - tamsulosin (FLOMAX) 0.4 MG CAPS capsule; Take 2 capsules (0.8 mg total) by mouth daily.  Dispense: 180 capsule; Refill: 3  5. Prediabetes  - Hemoglobin A1c  6. Medication management  - Lipid panel - CBC with Differential/Platelet - COMPLETE METABOLIC PANEL WITH GFR   Follow up plan: Return in about 6 months (around 07/18/2021) for follow up.

## 2021-01-19 ENCOUNTER — Other Ambulatory Visit: Payer: Self-pay | Admitting: Nurse Practitioner

## 2021-01-19 DIAGNOSIS — E785 Hyperlipidemia, unspecified: Secondary | ICD-10-CM

## 2021-01-19 LAB — CBC WITH DIFFERENTIAL/PLATELET
Absolute Monocytes: 353 cells/uL (ref 200–950)
Basophils Absolute: 8 cells/uL (ref 0–200)
Basophils Relative: 0.2 %
Eosinophils Absolute: 42 cells/uL (ref 15–500)
Eosinophils Relative: 1 %
HCT: 45.2 % (ref 38.5–50.0)
Hemoglobin: 14.4 g/dL (ref 13.2–17.1)
Lymphs Abs: 1924 cells/uL (ref 850–3900)
MCH: 28.2 pg (ref 27.0–33.0)
MCHC: 31.9 g/dL — ABNORMAL LOW (ref 32.0–36.0)
MCV: 88.5 fL (ref 80.0–100.0)
MPV: 9.7 fL (ref 7.5–12.5)
Monocytes Relative: 8.4 %
Neutro Abs: 1873 cells/uL (ref 1500–7800)
Neutrophils Relative %: 44.6 %
Platelets: 173 10*3/uL (ref 140–400)
RBC: 5.11 10*6/uL (ref 4.20–5.80)
RDW: 13 % (ref 11.0–15.0)
Total Lymphocyte: 45.8 %
WBC: 4.2 10*3/uL (ref 3.8–10.8)

## 2021-01-19 LAB — COMPLETE METABOLIC PANEL WITH GFR
AG Ratio: 1.6 (calc) (ref 1.0–2.5)
ALT: 13 U/L (ref 9–46)
AST: 14 U/L (ref 10–35)
Albumin: 4.3 g/dL (ref 3.6–5.1)
Alkaline phosphatase (APISO): 72 U/L (ref 35–144)
BUN: 15 mg/dL (ref 7–25)
CO2: 32 mmol/L (ref 20–32)
Calcium: 9.4 mg/dL (ref 8.6–10.3)
Chloride: 105 mmol/L (ref 98–110)
Creat: 1.13 mg/dL (ref 0.70–1.22)
Globulin: 2.7 g/dL (calc) (ref 1.9–3.7)
Glucose, Bld: 93 mg/dL (ref 65–99)
Potassium: 4.7 mmol/L (ref 3.5–5.3)
Sodium: 141 mmol/L (ref 135–146)
Total Bilirubin: 0.4 mg/dL (ref 0.2–1.2)
Total Protein: 7 g/dL (ref 6.1–8.1)
eGFR: 64 mL/min/{1.73_m2} (ref >=60)

## 2021-01-19 LAB — HEMOGLOBIN A1C
Hgb A1c MFr Bld: 6.2 % of total Hgb — ABNORMAL HIGH (ref <5.7)
Mean Plasma Glucose: 131 mg/dL
eAG (mmol/L): 7.3 mmol/L

## 2021-01-19 LAB — LIPID PANEL
Cholesterol: 174 mg/dL (ref <200)
HDL: 50 mg/dL (ref >=40)
LDL Cholesterol (Calc): 107 mg/dL (calc) — ABNORMAL HIGH
Non-HDL Cholesterol (Calc): 124 mg/dL (calc) (ref <130)
Total CHOL/HDL Ratio: 3.5 (calc) (ref <5.0)
Triglycerides: 80 mg/dL (ref <150)

## 2021-01-19 MED ORDER — ATORVASTATIN CALCIUM 10 MG PO TABS: 10.0000 mg | ORAL_TABLET | Freq: Every day | ORAL | 3 refills | Status: DC

## 2021-01-21 ENCOUNTER — Other Ambulatory Visit: Payer: Self-pay | Admitting: Nurse Practitioner

## 2021-01-21 DIAGNOSIS — R399 Unspecified symptoms and signs involving the genitourinary system: Secondary | ICD-10-CM

## 2021-01-21 DIAGNOSIS — R351 Nocturia: Secondary | ICD-10-CM

## 2021-01-27 ENCOUNTER — Ambulatory Visit: Payer: Medicare Other

## 2021-06-20 ENCOUNTER — Ambulatory Visit (INDEPENDENT_AMBULATORY_CARE_PROVIDER_SITE_OTHER): Payer: Medicare Other | Admitting: Internal Medicine

## 2021-06-20 ENCOUNTER — Encounter: Payer: Self-pay | Admitting: Internal Medicine

## 2021-06-20 VITALS — BP 136/72 | Temp 97.7°F | Resp 18 | Ht 72.0 in | Wt 227.6 lb

## 2021-06-20 DIAGNOSIS — Z1159 Encounter for screening for other viral diseases: Secondary | ICD-10-CM

## 2021-06-20 DIAGNOSIS — Z0000 Encounter for general adult medical examination without abnormal findings: Secondary | ICD-10-CM | POA: Diagnosis not present

## 2021-06-20 DIAGNOSIS — Z125 Encounter for screening for malignant neoplasm of prostate: Secondary | ICD-10-CM

## 2021-06-20 DIAGNOSIS — Z23 Encounter for immunization: Secondary | ICD-10-CM | POA: Diagnosis not present

## 2021-06-20 NOTE — Progress Notes (Signed)
Name: Blake Rivera   MRN: [id number]    DOB: [birth year]   Date:06/20/2021       Progress Note  Subjective  Chief Complaint  Chief Complaint  Patient presents with   Annual Exam    HPI  Patient presents for annual CPE.  IPSS Questionnaire (AUA-7): Over the past month.   1)  How often have you had a sensation of not emptying your bladder completely after you finish urinating?  1 - Less than 1 time in 5  2)  How often have you had to urinate again less than two hours after you finished urinating? 0 - Not at all  3)  How often have you found you stopped and started again several times when you urinated?  0 - Not at all  4) How difficult have you found it to postpone urination?  1 - Less than 1 time in 5  5) How often have you had a weak urinary stream?  1 - Less than 1 time in 5  6) How often have you had to push or strain to begin urination?  0 - Not at all  7) How many times did you most typically get up to urinate from the time you went to bed until the time you got up in the morning?  2 - 2 times  Total score:  0-7 mildly symptomatic   8-19 moderately symptomatic   20-35 severely symptomatic    Is currently taking Flomax 0.4 mg twice daily for nocturia symptoms.  Has not ever followed with urology.  Diet: well balanced  Exercise: likes to golf 3x a week   Depression: phq 9 is negative    01/18/2021    2:51 PM 07/15/2020    9:36 AM 01/27/2020    2:19 PM 12/23/2019    9:59 AM 08/21/2019    9:48 AM  Depression screen PHQ 2/9  Decreased Interest 0 0 0 0 0  Down, Depressed, Hopeless 0 0 0 0 0  PHQ - 2 Score 0 0 0 0 0  Altered sleeping  0   0  Tired, decreased energy  0   0  Change in appetite  0   0  Feeling bad or failure about yourself   0   0  Trouble concentrating  0   0  Moving slowly or fidgety/restless  0   0  Suicidal thoughts  0   0  PHQ-9 Score  0   0  Difficult doing work/chores  Not difficult at all   Not difficult at all    Hypertension:  BP Readings from  Last 3 Encounters:  06/20/21 136/72  01/18/21 132/78  07/15/20 138/72    Obesity: Wt Readings from Last 3 Encounters:  06/20/21 227 lb 9.6 oz (103.2 kg)  01/18/21 226 lb 14.4 oz (102.9 kg)  07/15/20 225 lb 4.8 oz (102.2 kg)   BMI Readings from Last 3 Encounters:  06/20/21 30.87 kg/m  01/18/21 30.77 kg/m  07/15/20 30.56 kg/m     Lipids:  Lab Results  Component Value Date   CHOL 174 01/18/2021   CHOL 171 12/23/2019   CHOL 190 10/30/2018   Lab Results  Component Value Date   HDL 50 01/18/2021   HDL 48 12/23/2019   HDL 50 10/30/2018   Lab Results  Component Value Date   LDLCALC 107 (H) 01/18/2021   LDLCALC 104 (H) 12/23/2019   LDLCALC 124 (H) 10/30/2018   Lab Results  Component Value Date  TRIG 80 01/18/2021   TRIG 93 12/23/2019   TRIG 72 10/30/2018   Lab Results  Component Value Date   CHOLHDL 3.5 01/18/2021   CHOLHDL 3.6 12/23/2019   CHOLHDL 3.8 10/30/2018   No results found for: LDLDIRECT Glucose:  Glucose, Bld  Date Value Ref Range Status  01/18/2021 93 65 - 99 mg/dL Final    Comment:    .            Fasting reference interval .   12/23/2019 115 (H) 65 - 99 mg/dL Final    Comment:    .            Fasting reference interval . For someone without known diabetes, a glucose value between 100 and 125 mg/dL is consistent with prediabetes and should be confirmed with a follow-up test. .   10/30/2018 111 (H) 65 - 99 mg/dL Final    Comment:    .            Fasting reference interval . For someone without known diabetes, a glucose value between 100 and 125 mg/dL is consistent with prediabetes and should be confirmed with a follow-up test. .     [place] Office Visit from 08/21/2019 in [facility]  AUDIT-C Score 1       Married STD testing and prevention (HIV/chl/gon/syphilis):  no Sexual history: In a monogamous relationship Hep C Screening: Due Skin cancer: Discussed monitoring for atypical  lesions Colorectal cancer: last colonoscopy about 3-5 years ago, with recommendations to repeat in 10 years.  I cannot review these results.  Patient denies family history of colon cancer. Prostate cancer:  yes, due  Lab Results  Component Value Date   PSA 1.1 10/30/2018   PSA 1.1 07/03/2016     Lung cancer:  Low Dose CT Chest recommended if Age 88-80 years, 30 pack-year currently smoking OR have quit w/in 15years. Patient  has smoked on and off for 50+ years, quit about 10 years ago. Will think about screening  AAA: The USPSTF recommends one-time screening with ultrasonography in men ages 38 to 57 years who have ever smoked. Patient   not applicable, a candidate for screening   Vaccines:   HPV: N/A Tdap: 01/2011, due but his insurance will not cover him getting it in the clinic.  Recommend he go to the health department for this vaccine. Shingrix: one dose in 2022, he did have the second dose 2-3 months ago  Pneumonia: due today  Flu: Up-to-date COVID-19: 2 orinigal vaccines and 2 boosters   Advanced Care Planning: A voluntary discussion about advance care planning including the explanation and discussion of advance directives.  Discussed health care proxy and Living will, which names his health care proxy. Patient does have a living will and power of attorney of health care.   Patient Active Problem List   Diagnosis Date Noted   Prediabetes 12/23/2019   Class 1 obesity due to excess calories with serious comorbidity and body mass index (BMI) of 31.0 to 31.9 in adult 08/21/2019   Nocturia 08/21/2019   Rotator cuff tendinitis, left 02/08/2018   Traumatic complete tear of left rotator cuff 02/08/2018   Localized, primary osteoarthritis 12/25/2017   Lipoma of scalp 12/03/2017   Obesity (BMI 30.0-34.9) 12/03/2017   CKD (chronic kidney disease) stage 2, GFR 60-89 ml/min 12/03/2017   Dyslipidemia 11/25/2014   Abnormal ECG 07/16/2014   History of colitis 07/16/2014   ED (erectile  dysfunction) 07/16/2014   Anterior  knee pain 07/16/2014   Arthralgia of shoulder 07/16/2014   Dyspnea 12/29/2013   HTN (hypertension) 12/29/2013    Past Surgical History:  Procedure Laterality Date   HERNIA REPAIR     ROTATOR CUFF REPAIR  03/17/2018    Family History  Problem Relation Age of Onset   Heart disease Mother    Aneurysm Mother    Heart disease Brother    Emphysema Father    Healthy Sister    Healthy Brother    Healthy Brother    Heart disease Sister     Social History   Socioeconomic History   Marital status: Married    Spouse name: Not on file   Number of children: 2   Years of education: Not on file   Highest education level: Doctorate  Occupational History   Occupation: Retired  Tobacco Use   Smoking status: Former    Packs/day: 0.25    Years: 25.00    Pack years: 6.25    Types: Cigarettes    Quit date: 05/18/2014    Years since quitting: 7.0   Smokeless tobacco: Never   Tobacco comments:    Former smoker - no need to provide with smoking cessation materials  Vaping Use   Vaping Use: Never used  Substance and Sexual Activity   Alcohol use: Yes    Alcohol/week: 1.0 standard drink    Types: 1 Shots of liquor per week    Comment: moderate - 1 whiskey shot per day   Drug use: No   Sexual activity: Yes  Other Topics Concern   Not on file  Social History Narrative   Not on file   Social Determinants of Health   Financial Resource Strain: Low Risk    Difficulty of Paying Living Expenses: Not hard at all  Food Insecurity: No Food Insecurity   Worried About Charity fundraiser in the Last Year: Never true   [place] in the Last Year: Never true  Transportation Needs: No Transportation Needs   Lack of Transportation (Medical): No   Lack of Transportation (Non-Medical): No  Physical Activity: Inactive   Days of Exercise per Week: 0 days   Minutes of Exercise per Session: 0 min  Stress: No Stress Concern Present   Feeling of Stress :  Not at all  Social Connections: Moderately Isolated   Frequency of Communication with Friends and Family: Three times a week   Frequency of Social Gatherings with Friends and Family: Twice a week   Attends Religious Services: Never   Marine scientist or Organizations: No   Attends Music therapist: Never   Marital Status: Married  Human resources officer Violence: Not At Risk   Fear of Current or Ex-Partner: No   Emotionally Abused: No   Physically Abused: No   Sexually Abused: No     Current Outpatient Medications:    amLODipine (NORVASC) 10 MG tablet, Take 1 tablet (10 mg total) by mouth daily., Disp: 90 tablet, Rfl: 3   atorvastatin (LIPITOR) 10 MG tablet, Take 1 tablet (10 mg total) by mouth daily., Disp: 90 tablet, Rfl: 3   Cod Liver Oil OIL, Take 1 tablet by mouth daily., Disp: , Rfl:    meloxicam (MOBIC) 7.5 MG tablet, meloxicam 7.5 mg tablet  TAKE 1 TABLET BY MOUTH EVERY DAY, Disp: , Rfl:    tamsulosin (FLOMAX) 0.4 MG CAPS capsule, Take 2 capsules (0.8 mg total) by mouth daily., Disp: 180 capsule, Rfl: 3  Allergies  Allergen Reactions   Lisinopril     Angioedema   Omacor  [Omega-3-Acid Ethyl Esters]     Other reaction(s): Rash   Zocor  [Simvastatin]     Muscle Pain     Review of Systems  All other systems reviewed and are negative.     Objective  Vitals:   06/20/21 1257  BP: 136/72  Resp: 18  Temp: 97.7 F (36.5 C)  Weight: 227 lb 9.6 oz (103.2 kg)  Height: 6' (1.829 m)    Body mass index is 30.87 kg/m.  Physical Exam Constitutional:      Appearance: Normal appearance. He is obese.  HENT:     Head: Normocephalic and atraumatic.     Nose: Nose normal.     Mouth/Throat:     Mouth: Mucous membranes are moist.     Pharynx: Oropharynx is clear.  Eyes:     Conjunctiva/sclera: Conjunctivae normal.  Cardiovascular:     Rate and Rhythm: Normal rate and regular rhythm.  Pulmonary:     Effort: Pulmonary effort is normal.     Breath  sounds: Normal breath sounds.  Abdominal:     General: There is no distension.     Palpations: Abdomen is soft.     Tenderness: There is no abdominal tenderness. There is no right CVA tenderness, left CVA tenderness, guarding or rebound.  Musculoskeletal:     Right lower leg: No edema.     Left lower leg: No edema.     Comments: Prominent left lower extremity varicose veins  Skin:    General: Skin is warm and dry.  Neurological:     General: No focal deficit present.     Mental Status: He is alert. Mental status is at baseline.  Psychiatric:        Mood and Affect: Mood normal.        Behavior: Behavior normal.     No results found for this or any previous visit (from the past 2160 hour(s)).   Fall Risk:    01/18/2021    2:51 PM 07/15/2020    9:36 AM 01/27/2020    2:20 PM 12/23/2019    9:59 AM 08/21/2019    9:48 AM  Fall Risk   Falls in the past year? 0 0 0 0 0  Number falls in past yr:  0 0 0 0  Injury with Fall?  0 0 0 0  Risk for fall due to :   No Fall Risks    Follow up Falls prevention discussed  Falls prevention discussed       Assessment & Plan  1. Annual visit for general adult medical examination without abnormal findings/Prostate cancer screening/Need for hepatitis C screening test: Due for PSA and hepatitis C screening, which will be obtained today.  - PSA - Hepatitis C Antibody  2. Vaccine for streptococcus pneumoniae and influenza: Prevnar 20 vaccine administered today.  - Pneumococcal conjugate vaccine 20-valent (Prevnar 20)   -Prostate cancer screening and PSA options (with potential risks and benefits of testing vs not testing) were discussed along with recent recs/guidelines. -USPSTF grade A and B recommendations reviewed with patient; age-appropriate recommendations, preventive care, screening tests, etc discussed and encouraged; healthy living encouraged; see AVS for patient education given to patient -Discussed importance of 150 minutes of physical  activity weekly, eat two servings of fish weekly, eat one serving of tree nuts ( cashews, pistachios, pecans, almonds.Marland Kitchen) every other day, eat 6 servings of fruit/vegetables daily and  drink plenty of water and avoid sweet beverages.  -Reviewed Health Maintenance: yes

## 2021-06-20 NOTE — Patient Instructions (Addendum)
It was great seeing you today!  Plan discussed at today's visit: -Blood work ordered today, results will be uploaded to [place].  -Pneumonia vaccine given today -If you decided to do any cancer screenings, please let me know  -You are due for tetanus vaccine, recommend going to the health department as it will be much cheaper for you.    Follow up in: next month   Take care and let us know if you have any questions or concerns prior to your next visit.  Dr. Rosana Berger

## 2021-06-21 LAB — HEPATITIS C ANTIBODY
Hepatitis C Ab: NONREACTIVE
SIGNAL TO CUT-OFF: 0.19 (ref <1.00)

## 2021-06-21 LAB — PSA: PSA: 2.88 ng/mL (ref <=4.00)

## 2021-07-25 NOTE — Progress Notes (Signed)
   Established Patient Office Visit  Subjective   Patient ID: Blake Rivera, male    DOB: 09-26-1934  Age: 87 y.o. MRN: [id number]  No chief complaint on file.   HPI Blake Rivera is a 87 year old male here for follow up.  Hypertension: -Medications: Amlodipine 10 mg.  -Patient is compliant with above medications and reports no side effects. -Checking BP at home (average): *** -Highest BP at home: *** -Lowest BP at home: *** -Denies any SOB, CP, vision changes, LE edema or symptoms of hypotension -Diet: *** -Exercise: ***  HLD: -Medications: Lipitor 10 mg, cod liver oil  -Patient is compliant with above medications and reports no side effects.  -Last lipid panel: Lipid Panel     Component Value Date/Time   CHOL 174 01/18/2021 1514   CHOL 195 08/26/2014 1020   TRIG 80 01/18/2021 1514   HDL 50 01/18/2021 1514   HDL 50 08/26/2014 1020   CHOLHDL 3.5 01/18/2021 1514   VLDL 24 07/03/2016 1522   LDLCALC 107 (H) 01/18/2021 1514   LABVLDL 29 08/26/2014 1020   Pre-Diabetes: -Last A1c 1/23 6.2% -Not currently on any medications   Nocturia/LUTS: -Currently on Flomax 0.4 mg for symptoms   Health Maintenance: -Blood work UTD   {History (Optional):23778}  ROS    Objective:     There were no vitals taken for this visit. {Vitals History (Optional):23777}  Physical Exam   No results found for any visits on 07/26/21.  {Labs (Optional):23779}  The ASCVD Risk score (Arnett DK, et al., 2019) failed to calculate for the following reasons:   The 2019 ASCVD risk score is only valid for ages 51 to 83    Assessment & Plan:   Problem List Items Addressed This Visit   None   No follow-ups on file.    Teodora Medici, DO

## 2021-07-26 ENCOUNTER — Ambulatory Visit (INDEPENDENT_AMBULATORY_CARE_PROVIDER_SITE_OTHER): Payer: Medicare Other | Admitting: Internal Medicine

## 2021-07-26 ENCOUNTER — Encounter: Payer: Self-pay | Admitting: Internal Medicine

## 2021-07-26 VITALS — BP 140/78 | HR 86 | Temp 98.3°F | Resp 16 | Ht 72.0 in | Wt 222.1 lb

## 2021-07-26 DIAGNOSIS — R7303 Prediabetes: Secondary | ICD-10-CM

## 2021-07-26 DIAGNOSIS — E785 Hyperlipidemia, unspecified: Secondary | ICD-10-CM

## 2021-07-26 DIAGNOSIS — R351 Nocturia: Secondary | ICD-10-CM

## 2021-07-26 DIAGNOSIS — I10 Essential (primary) hypertension: Secondary | ICD-10-CM

## 2021-07-26 NOTE — Assessment & Plan Note (Signed)
Currently urinating about 2-3 times a night.  On Flomax 0.8 mg daily.  Denies other urinary symptoms.  Discussed starting finasteride, patient states he will think about it and we will discuss again at his follow-up.

## 2021-07-26 NOTE — Assessment & Plan Note (Signed)
Stable.  Last checked in January of this year he was started on his statin.  Continue Lipitor 10 mg daily.  Plan to recheck fasting lipid panel in 6 months.

## 2021-07-26 NOTE — Assessment & Plan Note (Signed)
Chronic and stable.  Blood pressure at goal today.  Continue amlodipine 10 mg daily.  Follow-up in 6 months for recheck.

## 2021-07-26 NOTE — Patient Instructions (Signed)
It was great seeing you today!  Plan discussed at today's visit: -Continue all medications  -Plan to be fasting at least 8-12 hours prior to follow up appointment for blood work   Follow up in: 6 months   Take care and let us know if you have any questions or concerns prior to your next visit.  Dr. Rosana Berger

## 2021-07-26 NOTE — Assessment & Plan Note (Signed)
Last A1c 6.2% in January 2023.  Not currently on any medications.  Continue to work on lifestyle and diet changes.  Plan to recheck at follow-up.

## 2021-10-14 ENCOUNTER — Encounter: Payer: Self-pay | Admitting: Family Medicine

## 2021-10-14 ENCOUNTER — Ambulatory Visit (INDEPENDENT_AMBULATORY_CARE_PROVIDER_SITE_OTHER): Payer: Medicare Other | Admitting: Family Medicine

## 2021-10-14 VITALS — BP 144/72 | HR 44 | Temp 98.2°F | Resp 16 | Ht 72.0 in | Wt 232.1 lb

## 2021-10-14 DIAGNOSIS — S60450A Superficial foreign body of right index finger, initial encounter: Secondary | ICD-10-CM | POA: Diagnosis not present

## 2021-10-14 MED ORDER — CEPHALEXIN 500 MG PO CAPS: 500.0000 mg | ORAL_CAPSULE | Freq: Two times a day (BID) | ORAL | 0 refills | Status: AC

## 2021-10-14 NOTE — Progress Notes (Signed)
Patient ID: Blake Rivera, male    DOB: Dec 19, 1934, 87 y.o.   MRN: [id number]  PCP: Teodora Medici, DO  Chief Complaint  Patient presents with   Hand Pain    Right hand index finger pt believes there may be a splinter in between skin and nail has tried to take it our with a box cutter at home but he can not see it too well.    Subjective:   Blake Rivera is a 87 y.o. male, presents to clinic with CC of the following:  HPI  Patient presents with right index finger pain and swelling and a suspected splinter under his nail that he believes he got 2 days ago he has tried cutting back his nail and opening up the area with a new box cutter but he is not been able to get the splinter out  Patient Active Problem List   Diagnosis Date Noted   Prediabetes 12/23/2019   Class 1 obesity due to excess calories with serious comorbidity and body mass index (BMI) of 31.0 to 31.9 in adult 08/21/2019   Nocturia 08/21/2019   Rotator cuff tendinitis, left 02/08/2018   Traumatic complete tear of left rotator cuff 02/08/2018   Localized, primary osteoarthritis 12/25/2017   Lipoma of scalp 12/03/2017   Obesity (BMI 30.0-34.9) 12/03/2017   CKD (chronic kidney disease) stage 2, GFR 60-89 ml/min 12/03/2017   Dyslipidemia 11/25/2014   Abnormal ECG 07/16/2014   History of colitis 07/16/2014   ED (erectile dysfunction) 07/16/2014   Anterior knee pain 07/16/2014   Arthralgia of shoulder 07/16/2014   Dyspnea 12/29/2013   HTN (hypertension) 12/29/2013      Current Outpatient Medications:    amLODipine (NORVASC) 10 MG tablet, Take 1 tablet (10 mg total) by mouth daily., Disp: 90 tablet, Rfl: 3   atorvastatin (LIPITOR) 10 MG tablet, Take 1 tablet (10 mg total) by mouth daily., Disp: 90 tablet, Rfl: 3   Cod Liver Oil OIL, Take 1 tablet by mouth daily., Disp: , Rfl:    tamsulosin (FLOMAX) 0.4 MG CAPS capsule, Take 2 capsules (0.8 mg total) by mouth daily., Disp: 180 capsule, Rfl:  3   Allergies  Allergen Reactions   Lisinopril     Angioedema   Omacor  [Omega-3-Acid Ethyl Esters]     Other reaction(s): Rash   Zocor  [Simvastatin]     Muscle Pain     Social History   Tobacco Use   Smoking status: Former    Packs/day: 0.25    Years: 25.00    Total pack years: 6.25    Types: Cigarettes    Quit date: 05/18/2014    Years since quitting: 7.4   Smokeless tobacco: Never   Tobacco comments:    Former smoker - no need to provide with smoking cessation materials  Vaping Use   Vaping Use: Never used  Substance Use Topics   Alcohol use: Yes    Alcohol/week: 1.0 standard drink of alcohol    Types: 1 Shots of liquor per week    Comment: moderate - 1 whiskey shot per day   Drug use: No      Chart Review Today: I personally reviewed active problem list, medication list, allergies, family history, social history, health maintenance, notes from last encounter, lab results, imaging with the patient/caregiver today.   Review of Systems  Constitutional: Negative.   HENT: Negative.    Eyes: Negative.   Respiratory: Negative.    Cardiovascular: Negative.   Gastrointestinal:  Negative.   Endocrine: Negative.   Genitourinary: Negative.   Musculoskeletal: Negative.   Skin: Negative.   Allergic/Immunologic: Negative.   Neurological: Negative.   Hematological: Negative.   Psychiatric/Behavioral: Negative.    All other systems reviewed and are negative.      Objective:   Vitals:   10/14/21 1409 10/14/21 1416  BP: (!) 150/74 (!) 144/72  Pulse: (!) 44 (!) 44  Resp: 16   Temp: 98.2 F (36.8 C)   TempSrc: Oral   SpO2: 97%   Weight: 232 lb 1.6 oz (105.3 kg)   Height: 6' (1.829 m)     Body mass index is 31.48 kg/m.  Physical Exam Vitals reviewed.  Constitutional:      General: He is not in acute distress.    Appearance: Normal appearance. He is not ill-appearing, toxic-appearing or diaphoretic.     Comments: Well-appearing elderly male   Musculoskeletal:     Comments: Right index finger with some tenderness at the distal tip a small dark to black line under the middle of his distal nail  Neurological:     Mental Status: He is alert.     I & D  Date/Time: 10/14/2021 5:31 PM  Performed by: Delsa Grana, PA-C Authorized by: Delsa Grana, PA-C   Consent:    Consent obtained:  Verbal   Risks, benefits, and alternatives were discussed: yes     Risks discussed:  Bleeding, incomplete drainage, pain and infection   Alternatives discussed:  No treatment Location:    Type:  Abscess   Size:  FB to right distal fingernail   Location:  Upper extremity   Upper extremity location:  Finger   Finger location:  R index finger Pre-procedure details:    Skin preparation:  Antiseptic wash and alcohol Sedation:    Sedation type:  None Anesthesia:    Anesthesia method:  None Procedure type:    Complexity:  Simple Procedure details:    Incision type: used scissors and tweezers to help open up the edge of the finger and nail.   Drainage:  Purulent and bloody   Drainage amount:  Scant   Wound treatment:  Wound left open Post-procedure details:    Procedure completion:  Tolerated well, no immediate complications Comments:     Patient did have 2 times when bloody purulent discharge came at the edge of his finger and fingernail and the discoloration and redness visualized on the top of his fingernail decreased, did not get the splinter or foreign body out    Results for orders placed or performed in visit on 06/20/21  PSA  Result Value Ref Range   PSA 2.88 < OR = 4.00 ng/mL  Hepatitis C Antibody  Result Value Ref Range   Hepatitis C Ab NON-REACTIVE NON-REACTIVE   SIGNAL TO CUT-OFF 0.19 <1.00       Assessment & Plan:     1. Foreign body of right index finger  - cephALEXin (KEFLEX) 500 MG capsule; Take 1 capsule (500 mg total) by mouth 2 (two) times daily for 5 days.  Dispense: 10 capsule; Refill: 0 - I & D  Soaked in  clinic with soapy water and hydrogen peroxide The distal tip of his finger and fingernail was debrided, see I&D note -there was some relief of his pain and the discoloration of his nail when bloody purulence was drained.  Unfortunately foreign body was not removed He tolerated this in clinic and did have some relief of his pain He currently does  not appear to have a spreading infection Plan is to have him continue to soak his finger in antiseptic soapy water with hydrogen peroxide for several more days to try and keep the area open and to try and encourage continued drainage and for the splinter to come out Antibiotics were provided in case he does develop an infection I reviewed this with him encouraged him to start taking if he had swelling redness and pain that began to move of his finger For now he can apply antibiotic ointment to the tip of his finger and fingernail and cover with a bandage  Follow-up as needed  He may also need to go to urgent care for a more invasive procedure I did not have the supplies to do a good digital block here in the clinic and I did not think that the very small splinter warranted numbing or incision with a scalpel today, feel it is very safe to allow it to come out on its own and the patient did agree    Delsa Grana, PA-C 10/14/21 2:29 PM

## 2021-10-14 NOTE — Patient Instructions (Signed)
I recommend soaking it a couple times a day with warm soapy water (like dial soap or antiseptic soap such as hibiclens - [employer] or [employer]) and some hydrogen peroxide, try to keep the tissues there at the nail edge soft and allow it to drain and I think the splinter will work itself out. Continued drainage of some fluid, blood or pus it okay too - its better out than in  Take the antibiotics if the swelling, pain or redness starts to go down your finger

## 2021-10-27 ENCOUNTER — Ambulatory Visit (INDEPENDENT_AMBULATORY_CARE_PROVIDER_SITE_OTHER): Payer: Medicare Other | Admitting: Internal Medicine

## 2021-10-27 DIAGNOSIS — Z23 Encounter for immunization: Secondary | ICD-10-CM

## 2021-10-27 NOTE — Progress Notes (Signed)
Flu vaccine administered

## 2022-01-15 ENCOUNTER — Other Ambulatory Visit: Payer: Self-pay | Admitting: Nurse Practitioner

## 2022-01-15 DIAGNOSIS — E785 Hyperlipidemia, unspecified: Secondary | ICD-10-CM

## 2022-01-17 NOTE — Telephone Encounter (Signed)
Requested Prescriptions  Pending Prescriptions Disp Refills   atorvastatin (LIPITOR) 10 MG tablet [Pharmacy Med Name: ATORVASTATIN 10 MG TABLET] 30 tablet 0    Sig: TAKE 1 TABLET BY MOUTH EVERY DAY     Cardiovascular:  Antilipid - Statins Failed - 01/15/2022  9:07 AM      Failed - Lipid Panel in normal range within the last 12 months    Cholesterol, Total  Date Value Ref Range Status  08/26/2014 195 100 - 199 mg/dL Final   Cholesterol  Date Value Ref Range Status  01/18/2021 174 <200 mg/dL Final   LDL Cholesterol (Calc)  Date Value Ref Range Status  01/18/2021 107 (H) mg/dL (calc) Final    Comment:    Reference range: <100 . Desirable range <100 mg/dL for primary prevention;   <70 mg/dL for patients with CHD or diabetic patients  with > or = 2 CHD risk factors. Marland Kitchen LDL-C is now calculated using the Martin-Hopkins  calculation, which is a validated novel method providing  better accuracy than the Friedewald equation in the  estimation of LDL-C.  Cresenciano Genre et al. Annamaria Helling. 9357;017(79): 2061-2068  (http://education.[facility].com/faq/FAQ164)    HDL  Date Value Ref Range Status  01/18/2021 50 > OR = 40 mg/dL Final  08/26/2014 50 >39 mg/dL Final    Comment:    According to ATP-III Guidelines, HDL-C >59 mg/dL is considered a negative risk factor for CHD.    Triglycerides  Date Value Ref Range Status  01/18/2021 80 <150 mg/dL Final         Passed - Patient is not pregnant      Passed - Valid encounter within last 12 months    Recent Outpatient Visits           2 months ago Need for influenza vaccination   [place], DO   3 months ago Foreign body of right index finger   [facility] Delsa Grana, PA-C   5 months ago Primary hypertension   [place], DO   7 months ago Annual visit for general adult medical examination without abnormal findings   [facility] Teodora Medici, DO   12 months ago Essential hypertension   [facility] Bo Merino, FNP       Future Appointments             In 1 week Teodora Medici, [facility], [facility]

## 2022-01-25 NOTE — Progress Notes (Unsigned)
Established Patient Office Visit  Subjective   Patient ID: Blake Rivera, male    DOB: [birth year]  Age: 88 y.o. MRN: [id number]  No chief complaint on file.   HPI Blake Rivera is a 88 year old male here for follow up.   Hypertension: -Medications: Amlodipine 10 mg. Has been stable on this medication for years.  -Has history of angioedema with Lisinopril in the past. -Patient is compliant with above medications and reports no side effects. -Checking BP at home (average): doesn't check  -Denies any SOB, CP, vision changes, LE edema or symptoms of hypotension  HLD: -Medications: Lipitor 10 mg, cod liver oil - started after most recent lipid panel from January 2023. -Patient is compliant with above medications and reports no side effects.  -Last lipid panel: Lipid Panel     Component Value Date/Time   CHOL 174 01/18/2021 1514   CHOL 195 08/26/2014 1020   TRIG 80 01/18/2021 1514   HDL 50 01/18/2021 1514   HDL 50 08/26/2014 1020   CHOLHDL 3.5 01/18/2021 1514   VLDL 24 07/03/2016 1522   LDLCALC 107 (H) 01/18/2021 1514   LABVLDL 29 08/26/2014 1020   Pre-Diabetes: -Last A1c 1/23 6.2% -Not currently on any medications   Nocturia/LUTS: -Currently on Flomax 0.8 mg for symptoms  -Currently to get up to urinate 2-3 times a night  -Denies dysuria, hematuria, does have slowness to initiate urination.  Health Maintenance: -Blood work UTD     Review of Systems  Constitutional:  Negative for chills and fever.  Eyes:  Negative for blurred vision.  Respiratory:  Negative for shortness of breath.   Cardiovascular:  Negative for chest pain.  Gastrointestinal:  Negative for abdominal pain.  Genitourinary:  Positive for frequency. Negative for dysuria, hematuria and urgency.  Neurological:  Negative for headaches.      Objective:     There were no vitals taken for this visit. BP Readings from Last 3 Encounters:  10/14/21 (!) 144/72  07/26/21 140/78  06/20/21 136/72    Wt Readings from Last 3 Encounters:  10/14/21 232 lb 1.6 oz (105.3 kg)  07/26/21 222 lb 1.6 oz (100.7 kg)  06/20/21 227 lb 9.6 oz (103.2 kg)      Physical Exam Constitutional:      Appearance: Normal appearance.  HENT:     Head: Normocephalic and atraumatic.  Eyes:     Conjunctiva/sclera: Conjunctivae normal.  Cardiovascular:     Rate and Rhythm: Normal rate and regular rhythm.  Pulmonary:     Effort: Pulmonary effort is normal.     Breath sounds: Normal breath sounds.  Musculoskeletal:     Right lower leg: No edema.     Left lower leg: No edema.  Skin:    General: Skin is warm and dry.  Neurological:     General: No focal deficit present.     Mental Status: He is alert. Mental status is at baseline.  Psychiatric:        Mood and Affect: Mood normal.        Behavior: Behavior normal.      No results found for any visits on 01/26/22.  Last CBC Lab Results  Component Value Date   WBC 4.2 01/18/2021   HGB 14.4 01/18/2021   HCT 45.2 01/18/2021   MCV 88.5 01/18/2021   MCH 28.2 01/18/2021   RDW 13.0 01/18/2021   PLT 173 78/46/9629   Last metabolic panel Lab Results  Component Value Date   GLUCOSE  93 01/18/2021   NA 141 01/18/2021   K 4.7 01/18/2021   CL 105 01/18/2021   CO2 32 01/18/2021   BUN 15 01/18/2021   CREATININE 1.13 01/18/2021   EGFR 64 01/18/2021   CALCIUM 9.4 01/18/2021   PROT 7.0 01/18/2021   ALBUMIN 4.2 07/03/2016   LABGLOB 2.6 08/26/2014   AGRATIO 1.7 08/26/2014   BILITOT 0.4 01/18/2021   ALKPHOS 79 07/03/2016   AST 14 01/18/2021   ALT 13 01/18/2021   Last lipids Lab Results  Component Value Date   CHOL 174 01/18/2021   HDL 50 01/18/2021   LDLCALC 107 (H) 01/18/2021   TRIG 80 01/18/2021   CHOLHDL 3.5 01/18/2021   Last hemoglobin A1c Lab Results  Component Value Date   HGBA1C 6.2 (H) 01/18/2021   Last thyroid functions Lab Results  Component Value Date   TSH 2.86 05/21/2017   Last vitamin D Lab Results  Component  Value Date   VD25OH 21 (L) 05/21/2017   Last vitamin B12 and Folate No results found for: "VITAMINB12", "FOLATE"    The ASCVD Risk score (Arnett DK, et al., 2019) failed to calculate for the following reasons:   The 2019 ASCVD risk score is only valid for ages 37 to 27    Assessment & Plan:   Problem List Items Addressed This Visit   None  No follow-ups on file.    Blake Medici, DO

## 2022-01-26 ENCOUNTER — Encounter: Payer: Self-pay | Admitting: Internal Medicine

## 2022-01-26 ENCOUNTER — Ambulatory Visit (INDEPENDENT_AMBULATORY_CARE_PROVIDER_SITE_OTHER): Payer: Medicare Other | Admitting: Internal Medicine

## 2022-01-26 VITALS — BP 122/70 | HR 88 | Temp 97.7°F | Resp 18 | Ht 72.0 in | Wt 225.0 lb

## 2022-01-26 DIAGNOSIS — E538 Deficiency of other specified B group vitamins: Secondary | ICD-10-CM

## 2022-01-26 DIAGNOSIS — I10 Essential (primary) hypertension: Secondary | ICD-10-CM | POA: Diagnosis not present

## 2022-01-26 DIAGNOSIS — R351 Nocturia: Secondary | ICD-10-CM

## 2022-01-26 DIAGNOSIS — E785 Hyperlipidemia, unspecified: Secondary | ICD-10-CM

## 2022-01-26 DIAGNOSIS — R7303 Prediabetes: Secondary | ICD-10-CM

## 2022-01-26 MED ORDER — TAMSULOSIN HCL 0.4 MG PO CAPS: 0.8000 mg | ORAL_CAPSULE | Freq: Every day | ORAL | 3 refills | Status: DC

## 2022-01-26 MED ORDER — ATORVASTATIN CALCIUM 10 MG PO TABS: 10.0000 mg | ORAL_TABLET | Freq: Every day | ORAL | 3 refills | Status: DC

## 2022-01-26 MED ORDER — AMLODIPINE BESYLATE 10 MG PO TABS: 10.0000 mg | ORAL_TABLET | Freq: Every day | ORAL | 3 refills | Status: DC

## 2022-01-26 NOTE — Patient Instructions (Signed)
It was great seeing you today!  Plan discussed at today's visit: -Blood work ordered today, results will be uploaded to MyChart.  -Medications refilled -Tdap vaccine due - will covering whooping cough - can get at pharmacy   Follow up in: 6 months   Take care and let us know if you have any questions or concerns prior to your next visit.  Dr. Rosana Berger

## 2022-01-27 ENCOUNTER — Other Ambulatory Visit: Payer: Self-pay | Admitting: Internal Medicine

## 2022-01-27 DIAGNOSIS — E1165 Type 2 diabetes mellitus with hyperglycemia: Secondary | ICD-10-CM

## 2022-01-27 LAB — COMPLETE METABOLIC PANEL WITH GFR
AG Ratio: 1.6 (calc) (ref 1.0–2.5)
ALT: 14 U/L (ref 9–46)
AST: 15 U/L (ref 10–35)
Albumin: 4.2 g/dL (ref 3.6–5.1)
Alkaline phosphatase (APISO): 74 U/L (ref 35–144)
BUN: 12 mg/dL (ref 7–25)
CO2: 30 mmol/L (ref 20–32)
Calcium: 9.2 mg/dL (ref 8.6–10.3)
Chloride: 105 mmol/L (ref 98–110)
Creat: 1.08 mg/dL (ref 0.70–1.22)
Globulin: 2.7 g/dL (calc) (ref 1.9–3.7)
Glucose, Bld: 107 mg/dL — ABNORMAL HIGH (ref 65–99)
Potassium: 4.6 mmol/L (ref 3.5–5.3)
Sodium: 142 mmol/L (ref 135–146)
Total Bilirubin: 0.5 mg/dL (ref 0.2–1.2)
Total Protein: 6.9 g/dL (ref 6.1–8.1)
eGFR: 67 mL/min/{1.73_m2} (ref >=60)

## 2022-01-27 LAB — HEMOGLOBIN A1C
Hgb A1c MFr Bld: 6.5 % of total Hgb — ABNORMAL HIGH (ref <5.7)
Mean Plasma Glucose: 140 mg/dL
eAG (mmol/L): 7.7 mmol/L

## 2022-01-27 LAB — CBC WITH DIFFERENTIAL/PLATELET
Absolute Monocytes: 491 cells/uL (ref 200–950)
Basophils Absolute: 9 cells/uL (ref 0–200)
Basophils Relative: 0.2 %
Eosinophils Absolute: 59 cells/uL (ref 15–500)
Eosinophils Relative: 1.3 %
HCT: 45 % (ref 38.5–50.0)
Hemoglobin: 14.6 g/dL (ref 13.2–17.1)
Lymphs Abs: 1724 cells/uL (ref 850–3900)
MCH: 28.1 pg (ref 27.0–33.0)
MCHC: 32.4 g/dL (ref 32.0–36.0)
MCV: 86.7 fL (ref 80.0–100.0)
MPV: 9.6 fL (ref 7.5–12.5)
Monocytes Relative: 10.9 %
Neutro Abs: 2219 cells/uL (ref 1500–7800)
Neutrophils Relative %: 49.3 %
Platelets: 166 10*3/uL (ref 140–400)
RBC: 5.19 10*6/uL (ref 4.20–5.80)
RDW: 13.1 % (ref 11.0–15.0)
Total Lymphocyte: 38.3 %
WBC: 4.5 10*3/uL (ref 3.8–10.8)

## 2022-01-27 LAB — LIPID PANEL
Cholesterol: 135 mg/dL (ref <200)
HDL: 58 mg/dL (ref >=40)
LDL Cholesterol (Calc): 64 mg/dL (calc)
Non-HDL Cholesterol (Calc): 77 mg/dL (calc) (ref <130)
Total CHOL/HDL Ratio: 2.3 (calc) (ref <5.0)
Triglycerides: 52 mg/dL (ref <150)

## 2022-01-27 LAB — VITAMIN B12: Vitamin B-12: 813 pg/mL (ref 200–1100)

## 2022-01-27 MED ORDER — METFORMIN HCL 500 MG PO TABS: 500.0000 mg | ORAL_TABLET | Freq: Every day | ORAL | 1 refills | Status: DC

## 2022-02-02 NOTE — Telephone Encounter (Unsigned)
Copied from [place] [id number]. Topic: General - Inquiry >> Feb 02, 2022 12:07 PM Marcellus Scott wrote: Reason for CRM: Pt stated he received his pneumonia shot at the office, and it is not showing on his MyChart. Pt is requesting this be updated.  Stated received a Shingles flu shot at the pharmacy and would like this updated as well. Pt is unsure of the day he got his vaccines at the pharmacy. Stated end of the year 2023.  Please review.

## 2022-02-03 NOTE — Telephone Encounter (Signed)
All 3 vaccines (pneumonia, flu, and shingles) are already there. Unsure what patient is referring to

## 2022-03-02 ENCOUNTER — Ambulatory Visit (INDEPENDENT_AMBULATORY_CARE_PROVIDER_SITE_OTHER): Payer: Medicare Other

## 2022-03-02 VITALS — BP 122/68 | Ht 72.0 in | Wt 227.4 lb

## 2022-03-02 DIAGNOSIS — Z0000 Encounter for general adult medical examination without abnormal findings: Secondary | ICD-10-CM

## 2022-03-02 NOTE — Progress Notes (Signed)
Subjective:   Blake Rivera is a 88 y.o. male who presents for Medicare Annual/Subsequent preventive examination.  Review of Systems    Cardiac Risk Factors include: advanced age (>64mn, >>57women);hypertension;dyslipidemia;obesity (BMI >30kg/m2)     Objective:    Today's Vitals   03/02/22 0843  BP: 122/68  Weight: 227 lb 6.4 oz (103.1 kg)  Height: 6' (1.829 m)   Body mass index is 30.84 kg/m.     03/02/2022    9:07 AM 01/27/2020    2:20 PM 01/23/2019   10:57 AM 12/25/2017    8:38 AM 12/22/2016   11:03 AM 08/22/2016    9:32 AM 07/03/2016    2:06 PM  Advanced Directives  Does Patient Have a Medical Advance Directive? Yes Yes Yes Yes Yes No No  Type of ACorporate treasurerof [place]Living will [place]Living will [place] will [place] will    Copy of H[place]ena Vistain Chart?  No - copy requested No - copy requested No - copy requested No - copy requested      Current Medications (verified) Outpatient Encounter Medications as of 03/02/2022  Medication Sig   amLODipine (NORVASC) 10 MG tablet Take 1 tablet (10 mg total) by mouth daily.   atorvastatin (LIPITOR) 10 MG tablet Take 1 tablet (10 mg total) by mouth daily.   Cod Liver Oil OIL Take 1 tablet by mouth daily.   tamsulosin (FLOMAX) 0.4 MG CAPS capsule Take 2 capsules (0.8 mg total) by mouth daily.   metFORMIN (GLUCOPHAGE) 500 MG tablet Take 1 tablet (500 mg total) by mouth daily with breakfast. (Patient not taking: Reported on 03/02/2022)   No facility-administered encounter medications on file as of 03/02/2022.    Allergies (verified) Lisinopril, Omacor  [omega-3-acid ethyl esters], and Zocor  [simvastatin]   History: Past Medical History:  Diagnosis Date   Diverticulitis of colon    ED (erectile dysfunction)    Hyperglycemia    Hypertension    Vitamin D deficiency    Past Surgical History:  Procedure  Laterality Date   HERNIA REPAIR     ROTATOR CUFF REPAIR  03/17/2018   Family History  Problem Relation Age of Onset   Heart disease Mother    Aneurysm Mother    Heart disease Brother    Emphysema Father    Healthy Sister    Healthy Brother    Healthy Brother    Heart disease Sister    Social History   Socioeconomic History   Marital status: Married    Spouse name: Not on file   Number of children: 2   Years of education: Not on file   Highest education level: Doctorate  Occupational History   Occupation: Retired  Tobacco Use   Smoking status: Former    Packs/day: 0.25    Years: 25.00    Total pack years: 6.25    Types: Cigarettes    Quit date: 05/18/2014    Years since quitting: 7.7   Smokeless tobacco: Never   Tobacco comments:    Former smoker - no need to provide with smoking cessation materials  Vaping Use   Vaping Use: Never used  Substance and Sexual Activity   Alcohol use: Yes    Alcohol/week: 1.0 standard drink of alcohol    Types: 1 Shots of liquor per week    Comment: moderate - 1 whiskey shot per day   Drug use: No  Sexual activity: Yes  Other Topics Concern   Not on file  Social History Narrative   Not on file   Social Determinants of Health   Financial Resource Strain: Low Risk  (03/02/2022)   Overall Financial Resource Strain (CARDIA)    Difficulty of Paying Living Expenses: Not hard at all  Food Insecurity: No Food Insecurity (06/20/2021)   Hunger Vital Sign    Worried About Running Out of Food in the Last Year: Never true    Ran Out of Food in the Last Year: Never true  Transportation Needs: No Transportation Needs (03/02/2022)   PRAPARE - Hydrologist (Medical): No    Lack of Transportation (Non-Medical): No  Physical Activity: Sufficiently Active (03/02/2022)   Exercise Vital Sign    Days of Exercise per Week: 3 days    Minutes of Exercise per Session: 60 min  Stress: No Stress Concern Present (03/02/2022)    [place]    Feeling of Stress : Not at all  Social Connections: [place] (03/02/2022)   Social Connection and Isolation Panel [NHANES]    Frequency of Communication with Friends and Family: Three times a week    Frequency of Social Gatherings with Friends and Family: Twice a week    Attends Religious Services: More than 4 times per year    Active Member of [employer] or Organizations: Yes    Attends Music therapist: More than 4 times per year    Marital Status: Married    Tobacco Counseling Counseling given: Not Answered Tobacco comments: Former smoker - no need to provide with smoking cessation materials   Clinical Intake:  Pre-visit preparation completed: Yes  Pain : No/denies pain     BMI - recorded: 30.84 Nutritional Status: BMI > 30  Obese Nutritional Risks: None Diabetes: No  How often do you need to have someone help you when you read instructions, pamphlets, or other written materials from your doctor or pharmacy?: 1 - Never  Diabetic?no  Interpreter Needed?: No  Information entered by :: B.Antwonette Feliz,LPN   Activities of Daily Living    03/02/2022    9:07 AM 01/26/2022    8:54 AM  In your present state of health, do you have any difficulty performing the following activities:  Hearing? 0 0  Vision? 0 0  Difficulty concentrating or making decisions? 0 0  Walking or climbing stairs? 0 0  Dressing or bathing? 0 0  Doing errands, shopping? 0 0  Preparing Food and eating ? N   Using the Toilet? N   In the past six months, have you accidently leaked urine? N   Do you have problems with loss of bowel control? N   Managing your Medications? N   Managing your Finances? N   Housekeeping or managing your Housekeeping? N     Patient Care Team: Teodora Medici, DO as PCP - General (Internal Medicine)  Indicate any recent Medical Services you may have received from other than  [facility] providers in the past year (date may be approximate).     Assessment:   This is a routine wellness examination for [place].  Hearing/Vision screen Hearing Screening - Comments:: Adequate hearing Vision Screening - Comments:: Wears readers only&gt;[facility]  Dietary issues and exercise activities discussed: Exercise limited by: None identified   Goals Addressed   None    Depression Screen    03/02/2022    8:49 AM 01/26/2022  8:54 AM 10/14/2021    2:03 PM 07/26/2021    8:22 AM 01/18/2021    2:51 PM 07/15/2020    9:36 AM 01/27/2020    2:19 PM  PHQ 2/9 Scores  PHQ - 2 Score 0 0 0 0 0 0 0  PHQ- 9 Score 0 0 0 0  0     Fall Risk    03/02/2022    8:47 AM 01/26/2022    8:52 AM 10/14/2021    2:02 PM 07/26/2021    8:22 AM 01/18/2021    2:51 PM  Fall Risk   Falls in the past year? 0 0 0 0 0  Number falls in past yr: 0 0 0 0   Injury with Fall? 0 0 0 0   Risk for fall due to : No Fall Risks  No Fall Risks    Follow up Education provided;Falls prevention discussed  Falls prevention discussed;Education provided  Falls prevention discussed    FALL RISK PREVENTION PERTAINING TO THE HOME:  Any stairs in or around the home? Yes  If so, are there any without handrails? Yes  Home free of loose throw rugs in walkways, pet beds, electrical cords, etc? Yes  Adequate lighting in your home to reduce risk of falls? Yes   ASSISTIVE DEVICES UTILIZED TO PREVENT FALLS:  Life alert? No  Use of a cane, walker or w/c? No  Grab bars in the bathroom? Yes  Shower chair or bench in shower? No  Elevated toilet seat or a handicapped toilet? Yes    Cognitive Function:        03/02/2022    9:08 AM 12/25/2017    8:48 AM 12/22/2016   11:01 AM  6CIT Screen  What Year? 0 points 0 points 0 points  What month? 0 points 0 points 0 points  What time? 0 points 0 points 0 points  Count back from 20 0 points 0 points 0 points  Months in reverse 0 points 0 points 0 points  Repeat phrase   2 points 0 points  Total Score  2 points 0 points    Immunizations Immunization History  Administered Date(s) Administered   Fluad Quad(high Dose 65+) 10/30/2018, 10/09/2019, 10/26/2020, 10/27/2021   Influenza Split 12/02/2013   Influenza, High Dose Seasonal PF 10/24/2016, 10/16/2017   Influenza, Seasonal, Injecte, Preservative Fre 11/11/2012   Influenza-Unspecified 12/06/2014, 10/25/2015   Moderna SARS-COV2 Booster Vaccination 11/08/2021   PFIZER(Purple Top)SARS-COV-2 Vaccination 01/23/2019, 02/15/2019   PNEUMOCOCCAL CONJUGATE-20 06/20/2021   RSV,unspecified 12/16/2021   Tdap 02/03/2022   Zoster Recombinat (Shingrix) 08/18/2020, 04/16/2021    TDAP status: Up to date  Flu Vaccine status: Up to date  Pneumococcal vaccine status: Up to date  Covid-19 vaccine status: Completed vaccines  Qualifies for Shingles Vaccine? Yes   Zostavax completed No   Shingrix Completed?: No.    Education has been provided regarding the importance of this vaccine. Patient has been advised to call insurance company to determine out of pocket expense if they have not yet received this vaccine. Advised may also receive vaccine at local pharmacy or Health Dept. Verbalized acceptance and understanding.  Screening Tests Health Maintenance  Topic Date Due   COVID-19 Vaccine (3 - Pfizer risk series) 12/06/2021   Medicare Annual Wellness (AWV)  03/03/2023   Pneumonia Vaccine 65+ Years old  Completed   INFLUENZA VACCINE  Completed   Zoster Vaccines- Shingrix  Completed   HPV VACCINES  Aged Out   DTaP/Tdap/Td  Discontinued  Health Maintenance  Health Maintenance Due  Topic Date Due   COVID-19 Vaccine (3 - Pfizer risk series) 12/06/2021    Colorectal cancer screening: No longer required.   Lung Cancer Screening: (Low Dose CT Chest recommended if Age 11-80 years, 30 pack-year currently smoking OR have quit w/in 15years.) does not qualify.   Lung Cancer Screening Referral: no pt declines    Additional Screening:  Hepatitis C Screening: does not qualify; Completed no  Vision Screening: Recommended annual ophthalmology exams for early detection of glaucoma and other disorders of the eye. Is the patient up to date with their annual eye exam?  No  Who is the provider or what is the name of the office in which the patient attends annual eye exams? Dr Jeni Salles If pt is not established with a provider, would they like to be referred to a provider to establish care? No .   Dental Screening: Recommended annual dental exams for proper oral hygiene  Community Resource Referral / Chronic Care Management: CRR required this visit?  No   CCM required this visit?  No      Plan:     I have personally reviewed and noted the following in the patient's chart:   Medical and social history Use of alcohol, tobacco or illicit drugs  Current medications and supplements including opioid prescriptions. Patient is not currently taking opioid prescriptions. Functional ability and status Nutritional status Physical activity Advanced directives List of other physicians Hospitalizations, surgeries, and ER visits in previous 12 months Vitals Screenings to include cognitive, depression, and falls Referrals and appointments  In addition, I have reviewed and discussed with patient certain preventive protocols, quality metrics, and best practice recommendations. A written personalized care plan for preventive services as well as general preventive health recommendations were provided to patient.     Roger Shelter, LPN   [phone]   Nurse Notes: pt is doing well:he has no concerns or questions

## 2022-03-02 NOTE — Patient Instructions (Signed)
[name] , Thank you for taking time to come for your Medicare Wellness Visit. I appreciate your ongoing commitment to your health goals. Please review the following plan we discussed and let me know if I can assist you in the future.   These are the goals we discussed:  Goals      DIET - INCREASE WATER INTAKE     Recommend 6-8 glasses of water per day.        This is a list of the screening recommended for you and due dates:  Health Maintenance  Topic Date Due   COVID-19 Vaccine (3 - Pfizer risk series) 12/06/2021   Medicare Annual Wellness Visit  03/03/2023   Pneumonia Vaccine  Completed   Flu Shot  Completed   Zoster (Shingles) Vaccine  Completed   HPV Vaccine  Aged Out   DTaP/Tdap/Td vaccine  Discontinued    Advanced directives: yes  Conditions/risks identified: none  Next appointment: Follow up in one year for your annual wellness visit. 03/06/2023@ 0845 in person  Preventive Care 86 Years and Older, Male  Preventive care refers to lifestyle choices and visits with your health care provider that can promote health and wellness. What does preventive care include? A yearly physical exam. This is also called an annual well check. Dental exams once or twice a year. Routine eye exams. Ask your health care provider how often you should have your eyes checked. Personal lifestyle choices, including: Daily care of your teeth and gums. Regular physical activity. Eating a healthy diet. Avoiding tobacco and drug use. Limiting alcohol use. Practicing safe sex. Taking low doses of aspirin every day. Taking vitamin and mineral supplements as recommended by your health care provider. What happens during an annual well check? The services and screenings done by your health care provider during your annual well check will depend on your age, overall health, lifestyle risk factors, and family history of disease. Counseling  Your health care provider may ask you questions about  your: Alcohol use. Tobacco use. Drug use. Emotional well-being. Home and relationship well-being. Sexual activity. Eating habits. History of falls. Memory and ability to understand (cognition). Work and work Statistician. Screening  You may have the following tests or measurements: Height, weight, and BMI. Blood pressure. Lipid and cholesterol levels. These may be checked every 5 years, or more frequently if you are over 30 years old. Skin check. Lung cancer screening. You may have this screening every year starting at age 86 if you have a 30-pack-year history of smoking and currently smoke or have quit within the past 15 years. Fecal occult blood test (FOBT) of the stool. You may have this test every year starting at age 10. Flexible sigmoidoscopy or colonoscopy. You may have a sigmoidoscopy every 5 years or a colonoscopy every 10 years starting at age 86. Prostate cancer screening. Recommendations will vary depending on your family history and other risks. Hepatitis C blood test. Hepatitis B blood test. Sexually transmitted disease (STD) testing. Diabetes screening. This is done by checking your blood sugar (glucose) after you have not eaten for a while (fasting). You may have this done every 1-3 years. Abdominal aortic aneurysm (AAA) screening. You may need this if you are a current or former smoker. Osteoporosis. You may be screened starting at age 30 if you are at high risk. Talk with your health care provider about your test results, treatment options, and if necessary, the need for more tests. Vaccines  Your health care provider may  recommend certain vaccines, such as: Influenza vaccine. This is recommended every year. Tetanus, diphtheria, and acellular pertussis (Tdap, Td) vaccine. You may need a Td booster every 10 years. Zoster vaccine. You may need this after age 8. Pneumococcal 13-valent conjugate (PCV13) vaccine. One dose is recommended after age 35. Pneumococcal  polysaccharide (PPSV23) vaccine. One dose is recommended after age 24. Talk to your health care provider about which screenings and vaccines you need and how often you need them. This information is not intended to replace advice given to you by your health care provider. Make sure you discuss any questions you have with your health care provider. Document Released: 01/29/2015 Document Revised: 09/22/2015 Document Reviewed: 11/03/2014 Elsevier Interactive Patient Education  2017 [employer] Prevention in the Home Falls can cause injuries. They can happen to people of all ages. There are many things you can do to make your home safe and to help prevent falls. What can I do on the outside of my home? Regularly fix the edges of walkways and driveways and fix any cracks. Remove anything that might make you trip as you walk through a door, such as a raised step or threshold. Trim any bushes or trees on the path to your home. Use bright outdoor lighting. Clear any walking paths of anything that might make someone trip, such as rocks or tools. Regularly check to see if handrails are loose or broken. Make sure that both sides of any steps have handrails. Any raised decks and porches should have guardrails on the edges. Have any leaves, snow, or ice cleared regularly. Use sand or salt on walking paths during winter. Clean up any spills in your garage right away. This includes oil or grease spills. What can I do in the bathroom? Use night lights. Install grab bars by the toilet and in the tub and shower. Do not use towel bars as grab bars. Use non-skid mats or decals in the tub or shower. If you need to sit down in the shower, use a plastic, non-slip stool. Keep the floor dry. Clean up any water that spills on the floor as soon as it happens. Remove soap buildup in the tub or shower regularly. Attach bath mats securely with double-sided non-slip rug tape. Do not have throw rugs and other  things on the floor that can make you trip. What can I do in the bedroom? Use night lights. Make sure that you have a light by your bed that is easy to reach. Do not use any sheets or blankets that are too big for your bed. They should not hang down onto the floor. Have a firm chair that has side arms. You can use this for support while you get dressed. Do not have throw rugs and other things on the floor that can make you trip. What can I do in the kitchen? Clean up any spills right away. Avoid walking on wet floors. Keep items that you use a lot in easy-to-reach places. If you need to reach something above you, use a strong step stool that has a grab bar. Keep electrical cords out of the way. Do not use floor polish or wax that makes floors slippery. If you must use wax, use non-skid floor wax. Do not have throw rugs and other things on the floor that can make you trip. What can I do with my stairs? Do not leave any items on the stairs. Make sure that there are handrails on both sides of  the stairs and use them. Fix handrails that are broken or loose. Make sure that handrails are as long as the stairways. Check any carpeting to make sure that it is firmly attached to the stairs. Fix any carpet that is loose or worn. Avoid having throw rugs at the top or bottom of the stairs. If you do have throw rugs, attach them to the floor with carpet tape. Make sure that you have a light switch at the top of the stairs and the bottom of the stairs. If you do not have them, ask someone to add them for you. What else can I do to help prevent falls? Wear shoes that: Do not have high heels. Have rubber bottoms. Are comfortable and fit you well. Are closed at the toe. Do not wear sandals. If you use a stepladder: Make sure that it is fully opened. Do not climb a closed stepladder. Make sure that both sides of the stepladder are locked into place. Ask someone to hold it for you, if possible. Clearly  mark and make sure that you can see: Any grab bars or handrails. First and last steps. Where the edge of each step is. Use tools that help you move around (mobility aids) if they are needed. These include: Canes. Walkers. Scooters. Crutches. Turn on the lights when you go into a dark area. Replace any light bulbs as soon as they burn out. Set up your furniture so you have a clear path. Avoid moving your furniture around. If any of your floors are uneven, fix them. If there are any pets around you, be aware of where they are. Review your medicines with your doctor. Some medicines can make you feel dizzy. This can increase your chance of falling. Ask your doctor what other things that you can do to help prevent falls. This information is not intended to replace advice given to you by your health care provider. Make sure you discuss any questions you have with your health care provider. Document Released: 10/29/2008 Document Revised: 06/10/2015 Document Reviewed: 02/06/2014 Elsevier Interactive Patient Education  2017 [employer].

## 2022-07-26 NOTE — Progress Notes (Unsigned)
Established Patient Office Visit  Subjective   Patient ID: Blake Rivera, male    DOB: 1934-05-07  Age: 88 y.o. MRN: [id number]  No chief complaint on file.   HPI Patient presenting for follow up on chronic medical conditions.    Hypertension: -Medications: Amlodipine 10 mg. Has been stable on this medication for years.  -Has history of angioedema with Lisinopril in the past. -Patient is compliant with above medications and reports no side effects. -Checking BP at home (average): doesn't check  -Denies any SOB, CP, vision changes, LE edema or symptoms of hypotension  HLD: -Medications: Lipitor 10 mg, cod liver oil - started after most recent lipid panel from January 2023. -Patient is compliant with above medications and reports no side effects.  -Last lipid panel: Lipid Panel     Component Value Date/Time   CHOL 135 01/26/2022 0935   CHOL 195 08/26/2014 1020   TRIG 52 01/26/2022 0935   HDL 58 01/26/2022 0935   HDL 50 08/26/2014 1020   CHOLHDL 2.3 01/26/2022 0935   VLDL 24 07/03/2016 1522   LDLCALC 64 01/26/2022 0935   LABVLDL 29 08/26/2014 1020   Diabetes: -Last A1c 1/24 6.5% -Started on Metformin 500 mg once daily at LOV  Nocturia/LUTS: -Currently on Flomax 0.8 mg for symptoms, taking right before bed -Currently to get up to urinate 2-3 times a night  -Denies dysuria, hematuria, does have slowness to initiate urination.  Health Maintenance: -Blood work due -Tdap due, discussed getting at his pharmacy as his insurance won't cover it   Review of Systems  Constitutional:  Negative for chills and fever.  Eyes:  Negative for blurred vision.  Respiratory:  Negative for shortness of breath.   Cardiovascular:  Negative for chest pain.  Gastrointestinal:  Negative for abdominal pain.  Genitourinary:  Positive for frequency. Negative for dysuria, hematuria and urgency.  Neurological:  Negative for dizziness and headaches.      Objective:     There were no  vitals taken for this visit. BP Readings from Last 3 Encounters:  03/02/22 122/68  01/26/22 122/70  10/14/21 (!) 144/72   Wt Readings from Last 3 Encounters:  03/02/22 227 lb 6.4 oz (103.1 kg)  01/26/22 225 lb (102.1 kg)  10/14/21 232 lb 1.6 oz (105.3 kg)      Physical Exam Constitutional:      Appearance: Normal appearance.  HENT:     Head: Normocephalic and atraumatic.  Eyes:     Conjunctiva/sclera: Conjunctivae normal.  Cardiovascular:     Rate and Rhythm: Normal rate and regular rhythm.  Pulmonary:     Effort: Pulmonary effort is normal.     Breath sounds: Normal breath sounds.  Musculoskeletal:     Right lower leg: No edema.     Left lower leg: No edema.  Skin:    General: Skin is warm and dry.  Neurological:     General: No focal deficit present.     Mental Status: He is alert. Mental status is at baseline.  Psychiatric:        Mood and Affect: Mood normal.        Behavior: Behavior normal.      No results found for any visits on 07/27/22.  Last CBC Lab Results  Component Value Date   WBC 4.5 01/26/2022   HGB 14.6 01/26/2022   HCT 45.0 01/26/2022   MCV 86.7 01/26/2022   MCH 28.1 01/26/2022   RDW 13.1 01/26/2022   PLT 166 01/26/2022  Last metabolic panel Lab Results  Component Value Date   GLUCOSE 107 (H) 01/26/2022   NA 142 01/26/2022   K 4.6 01/26/2022   CL 105 01/26/2022   CO2 30 01/26/2022   BUN 12 01/26/2022   CREATININE 1.08 01/26/2022   EGFR 67 01/26/2022   CALCIUM 9.2 01/26/2022   PROT 6.9 01/26/2022   ALBUMIN 4.2 07/03/2016   LABGLOB 2.6 08/26/2014   AGRATIO 1.7 08/26/2014   BILITOT 0.5 01/26/2022   ALKPHOS 79 07/03/2016   AST 15 01/26/2022   ALT 14 01/26/2022   Last lipids Lab Results  Component Value Date   CHOL 135 01/26/2022   HDL 58 01/26/2022   LDLCALC 64 01/26/2022   TRIG 52 01/26/2022   CHOLHDL 2.3 01/26/2022   Last hemoglobin A1c Lab Results  Component Value Date   HGBA1C 6.5 (H) 01/26/2022   Last thyroid  functions Lab Results  Component Value Date   TSH 2.86 05/21/2017   Last vitamin D Lab Results  Component Value Date   VD25OH 21 (L) 05/21/2017   Last vitamin B12 and Folate Lab Results  Component Value Date   VITAMINB12 813 01/26/2022      The ASCVD Risk score (Arnett DK, et al., 2019) failed to calculate for the following reasons:   The 2019 ASCVD risk score is only valid for ages 56 to 67    Assessment & Plan:   1. Essential hypertension: Stable, slightly low for him but states he is feeling well. Discussed obtaining a cuff so he can monitor BP at home and let me know if it is going low, may not require as much medication. Continue Amlodipine 10 mg for now, refilled. Due for CBC, CMP. Follow up in 6 months to recheck.  - CBC w/Diff/Platelet - COMPLETE METABOLIC PANEL WITH GFR - amLODipine (NORVASC) 10 MG tablet; Take 1 tablet (10 mg total) by mouth daily.  Dispense: 90 tablet; Refill: 3  2. Dyslipidemia: Check lipid panel today, continue Lipitor 10 mg, refilled.  - Lipid Profile - atorvastatin (LIPITOR) 10 MG tablet; Take 1 tablet (10 mg total) by mouth daily.  Dispense: 90 tablet; Refill: 3  3. Prediabetes: A1c check today.   - HgB A1c  4. Nocturia: Discussed taking Flomax earlier in the day, refilled.   - tamsulosin (FLOMAX) 0.4 MG CAPS capsule; Take 2 capsules (0.8 mg total) by mouth daily.  Dispense: 180 capsule; Refill: 3  5. Vitamin B12 deficiency: Patient taking OTC vitamin b12 supplements, will check levels today.   - Vitamin B12    No follow-ups on file.    Margarita Mail, DO

## 2022-07-27 ENCOUNTER — Ambulatory Visit: Payer: Medicare Other | Admitting: Internal Medicine

## 2022-07-27 ENCOUNTER — Encounter: Payer: Self-pay | Admitting: Internal Medicine

## 2022-07-27 VITALS — BP 138/62 | HR 80 | Temp 97.9°F | Resp 18 | Ht 72.0 in | Wt 214.9 lb

## 2022-07-27 DIAGNOSIS — E785 Hyperlipidemia, unspecified: Secondary | ICD-10-CM

## 2022-07-27 DIAGNOSIS — E1165 Type 2 diabetes mellitus with hyperglycemia: Secondary | ICD-10-CM | POA: Diagnosis not present

## 2022-07-27 DIAGNOSIS — I10 Essential (primary) hypertension: Secondary | ICD-10-CM

## 2022-07-27 LAB — POCT GLYCOSYLATED HEMOGLOBIN (HGB A1C): Hemoglobin A1C: 6.1 % — AB (ref 4.0–5.6)

## 2022-07-28 LAB — MICROALBUMIN / CREATININE URINE RATIO
Creatinine, Urine: 175 mg/dL (ref 20–320)
Microalb Creat Ratio: 20 mg/g creat (ref <30)
Microalb, Ur: 3.5 mg/dL

## 2023-01-18 ENCOUNTER — Encounter: Payer: Self-pay | Admitting: Family Medicine

## 2023-01-18 ENCOUNTER — Ambulatory Visit (INDEPENDENT_AMBULATORY_CARE_PROVIDER_SITE_OTHER): Payer: Medicare Other | Admitting: Family Medicine

## 2023-01-18 VITALS — BP 120/68 | HR 61 | Resp 16 | Ht 72.0 in | Wt 216.6 lb

## 2023-01-18 DIAGNOSIS — R300 Dysuria: Secondary | ICD-10-CM

## 2023-01-18 DIAGNOSIS — R319 Hematuria, unspecified: Secondary | ICD-10-CM

## 2023-01-18 LAB — POCT URINALYSIS DIPSTICK
Bilirubin, UA: NEGATIVE
Glucose, UA: NEGATIVE
Ketones, UA: NEGATIVE
Nitrite, UA: NEGATIVE
Protein, UA: NEGATIVE
Spec Grav, UA: 1.025 (ref 1.010–1.025)
Urobilinogen, UA: 0.2 U/dL
pH, UA: 5 (ref 5.0–8.0)

## 2023-01-18 MED ORDER — DOXYCYCLINE HYCLATE 100 MG PO TABS: 100.0000 mg | ORAL_TABLET | Freq: Two times a day (BID) | ORAL | 0 refills | Status: DC

## 2023-01-18 NOTE — Progress Notes (Signed)
 Name: Blake Rivera   MRN: [id number]    DOB: Feb 26, 1934   Date:01/18/2023       Progress Note  Subjective  Chief Complaint  Chief Complaint  Patient presents with   Hematuria    Red/dark color   Dysuria    Since last Tuesday, burning sensation    HPI  Discussed the use of AI scribe software for clinical note transcription with the patient, who gave verbal consent to proceed.  History of Present Illness   The patient presented with a chief complaint of dark urine and urinary discomfort that started last week. He reported no prior history of urinary tract infections or kidney stones. The patient denied having fever or chills, and did not notice any unusual odor in the urine.  Upon urine dipstick testing, a large amount of blood was detected, along with some pus cells, but no protein or nitrates.  The patient has a history of smoking cigarettes for an extended period, but recently quit, although he still occasionally smokes cigars. He is currently on tamsulosin  for prostate enlargement.  He denied experiencing any nausea, vomiting, or changes in appetite.         Patient Active Problem List   Diagnosis Date Noted   Prediabetes 12/23/2019   Class 1 obesity due to excess calories with serious comorbidity and body mass index (BMI) of 31.0 to 31.9 in adult 08/21/2019   Nocturia 08/21/2019   Rotator cuff tendinitis, left 02/08/2018   Traumatic complete tear of left rotator cuff 02/08/2018   Localized, primary osteoarthritis 12/25/2017   Lipoma of scalp 12/03/2017   Obesity (BMI 30.0-34.9) 12/03/2017   CKD (chronic kidney disease) stage 2, GFR 60-89 ml/min 12/03/2017   Dyslipidemia 11/25/2014   Abnormal ECG 07/16/2014   History of colitis 07/16/2014   ED (erectile dysfunction) 07/16/2014   Anterior knee pain 07/16/2014   Arthralgia of shoulder 07/16/2014   Dyspnea 12/29/2013   HTN (hypertension) 12/29/2013    Past Surgical History:  Procedure Laterality Date   HERNIA  REPAIR     ROTATOR CUFF REPAIR  03/17/2018    Family History  Problem Relation Age of Onset   Heart disease Mother    Aneurysm Mother    Heart disease Brother    Emphysema Father    Healthy Sister    Healthy Brother    Healthy Brother    Heart disease Sister     Social History   Tobacco Use   Smoking status: Former    Current packs/day: 0.00    Average packs/day: 0.3 packs/day for 25.0 years (6.3 ttl pk-yrs)    Types: Cigarettes    Start date: 05/17/1989    Quit date: 05/18/2014    Years since quitting: 8.6   Smokeless tobacco: Never   Tobacco comments:    Former smoker - no need to provide with smoking cessation materials  Substance Use Topics   Alcohol use: Yes    Alcohol/week: 1.0 standard drink of alcohol    Types: 1 Shots of liquor per week    Comment: moderate - 1 whiskey shot per day     Current Outpatient Medications:    doxycycline  (VIBRA -TABS) 100 MG tablet, Take 1 tablet (100 mg total) by mouth 2 (two) times daily., Disp: 14 tablet, Rfl: 0   amLODipine  (NORVASC ) 10 MG tablet, Take 1 tablet (10 mg total) by mouth daily., Disp: 90 tablet, Rfl: 3   atorvastatin  (LIPITOR) 10 MG tablet, Take 1 tablet (10 mg total) by mouth daily.,  Disp: 90 tablet, Rfl: 3   Cod Liver Oil OIL, Take 1 tablet by mouth daily., Disp: , Rfl:    tamsulosin  (FLOMAX ) 0.4 MG CAPS capsule, Take 2 capsules (0.8 mg total) by mouth daily., Disp: 180 capsule, Rfl: 3  Allergies  Allergen Reactions   Lisinopril     Angioedema   Omacor  [Omega-3-Acid Ethyl Esters (Fish)]     Other reaction(s): Rash   Zocor  [Simvastatin]     Muscle Pain    I personally reviewed active problem list, medication list, allergies with the patient/caregiver today.   ROS  Ten systems reviewed and is negative except as mentioned in HPI    Objective  Vitals:   01/18/23 0848  BP: 120/68  Pulse: 61  Resp: 16  Weight: 216 lb 9.6 oz (98.2 kg)  Height: 6' (1.829 m)    Body mass index is 29.38  kg/m.  Physical Exam  Constitutional: Patient appears well-developed and well-nourished.  No distress.  HEENT: head atraumatic, normocephalic, pupils equal and reactive to light, neck supple Cardiovascular: Normal rate, regular rhythm and normal heart sounds.  No murmur heard. No BLE edema. Pulmonary/Chest: Effort normal and breath sounds normal. No respiratory distress. Abdominal: Soft.  There is no tenderness. Negative CVA.  Psychiatric: Patient has a normal mood and affect. behavior is normal. Judgment and thought content normal.   Recent Results (from the past 2160 hours)  POCT urinalysis dipstick     Status: Abnormal   Collection Time: 01/18/23  8:50 AM  Result Value Ref Range   Color, UA Yellow    Clarity, UA CLoudy    Glucose, UA Negative Negative   Bilirubin, UA Negative    Ketones, UA Negative    Spec Grav, UA 1.025 1.010 - 1.025   Blood, UA LArge    pH, UA 5.0 5.0 - 8.0   Protein, UA Negative Negative   Urobilinogen, UA 0.2 0.2 or 1.0 E.U./dL   Nitrite, UA Negative    Leukocytes, UA Trace (A) Negative   Appearance yellow    Odor foul     Diabetic Foot Exam:     PHQ2/9:    01/18/2023    8:49 AM 07/27/2022    8:24 AM 03/02/2022    8:49 AM 01/26/2022    8:54 AM 10/14/2021    2:03 PM  Depression screen PHQ 2/9  Decreased Interest 0 0 0 0 0  Down, Depressed, Hopeless 0 0 0 0 0  PHQ - 2 Score 0 0 0 0 0  Altered sleeping  0 0 0 0  Tired, decreased energy  0 0 0 0  Change in appetite  0 0 0 0  Feeling bad or failure about yourself   0 0 0 0  Trouble concentrating  0 0 0 0  Moving slowly or fidgety/restless  0 0 0 0  Suicidal thoughts  0 0 0 0  PHQ-9 Score  0 0 0 0  Difficult doing work/chores  Not difficult at all Not difficult at all Not difficult at all Not difficult at all    phq 9 is negative   Fall Risk:    01/18/2023    8:48 AM 07/27/2022    8:24 AM 03/02/2022    8:47 AM 01/26/2022    8:52 AM 10/14/2021    2:02 PM  Fall Risk   Falls in the past year?  0 0 0 0 0  Number falls in past yr: 0 0 0 0 0  Injury with  Fall? 0 0 0 0 0  Risk for fall due to : No Fall Risks  No Fall Risks  No Fall Risks  Follow up Falls prevention discussed;Education provided;Falls evaluation completed  Education provided;Falls prevention discussed  Falls prevention discussed;Education provided      Assessment & Plan   Hematuria Dark urine with discomfort, no fever or chills. Urinalysis showed large amount of blood and some pus cells, but no nitrates. Possible bladder infection or bladder polyp. History of smoking. -Send urine for culture. -Start Doxycycline  for 7 days in case of prostatitis. -Refer to urologist if no growth of bacteria in urine culture. -Keep appointment with regular provider.  Prostate Enlargement Currently on Tamsulosin . -Continue Tamsulosin  as prescribed.

## 2023-01-19 ENCOUNTER — Other Ambulatory Visit: Payer: Self-pay

## 2023-01-19 ENCOUNTER — Emergency Department: Payer: Medicare Other

## 2023-01-19 ENCOUNTER — Inpatient Hospital Stay
Admission: EM | Admit: 2023-01-19 | Discharge: 2023-01-24 | DRG: 854 | Disposition: A | Discharge status: Home / Self Care | Payer: Medicare Other | Attending: Internal Medicine | Admitting: Internal Medicine

## 2023-01-19 DIAGNOSIS — K8070 Calculus of gallbladder and bile duct without cholecystitis without obstruction: Secondary | ICD-10-CM | POA: Diagnosis not present

## 2023-01-19 DIAGNOSIS — K660 Peritoneal adhesions (postprocedural) (postinfection): Secondary | ICD-10-CM | POA: Diagnosis present

## 2023-01-19 DIAGNOSIS — R7989 Other specified abnormal findings of blood chemistry: Secondary | ICD-10-CM | POA: Diagnosis not present

## 2023-01-19 DIAGNOSIS — Z888 Allergy status to other drugs, medicaments and biological substances status: Secondary | ICD-10-CM

## 2023-01-19 DIAGNOSIS — K810 Acute cholecystitis: Secondary | ICD-10-CM | POA: Diagnosis not present

## 2023-01-19 DIAGNOSIS — R7881 Bacteremia: Principal | ICD-10-CM | POA: Diagnosis present

## 2023-01-19 DIAGNOSIS — Z7984 Long term (current) use of oral hypoglycemic drugs: Secondary | ICD-10-CM | POA: Diagnosis not present

## 2023-01-19 DIAGNOSIS — R7303 Prediabetes: Secondary | ICD-10-CM | POA: Diagnosis present

## 2023-01-19 DIAGNOSIS — Z6828 Body mass index (BMI) 28.0-28.9, adult: Secondary | ICD-10-CM

## 2023-01-19 DIAGNOSIS — N182 Chronic kidney disease, stage 2 (mild): Secondary | ICD-10-CM | POA: Diagnosis not present

## 2023-01-19 DIAGNOSIS — Z825 Family history of asthma and other chronic lower respiratory diseases: Secondary | ICD-10-CM | POA: Diagnosis not present

## 2023-01-19 DIAGNOSIS — K8065 Calculus of gallbladder and bile duct with chronic cholecystitis with obstruction: Secondary | ICD-10-CM | POA: Diagnosis present

## 2023-01-19 DIAGNOSIS — E1122 Type 2 diabetes mellitus with diabetic chronic kidney disease: Secondary | ICD-10-CM | POA: Diagnosis present

## 2023-01-19 DIAGNOSIS — E1165 Type 2 diabetes mellitus with hyperglycemia: Secondary | ICD-10-CM | POA: Diagnosis not present

## 2023-01-19 DIAGNOSIS — N281 Cyst of kidney, acquired: Secondary | ICD-10-CM | POA: Diagnosis not present

## 2023-01-19 DIAGNOSIS — E785 Hyperlipidemia, unspecified: Secondary | ICD-10-CM | POA: Diagnosis not present

## 2023-01-19 DIAGNOSIS — I10 Essential (primary) hypertension: Secondary | ICD-10-CM | POA: Diagnosis present

## 2023-01-19 DIAGNOSIS — Z794 Long term (current) use of insulin: Secondary | ICD-10-CM

## 2023-01-19 DIAGNOSIS — Z8249 Family history of ischemic heart disease and other diseases of the circulatory system: Secondary | ICD-10-CM | POA: Diagnosis not present

## 2023-01-19 DIAGNOSIS — K828 Other specified diseases of gallbladder: Secondary | ICD-10-CM | POA: Diagnosis not present

## 2023-01-19 DIAGNOSIS — Z79899 Other long term (current) drug therapy: Secondary | ICD-10-CM | POA: Diagnosis not present

## 2023-01-19 DIAGNOSIS — K8050 Calculus of bile duct without cholangitis or cholecystitis without obstruction: Secondary | ICD-10-CM | POA: Diagnosis present

## 2023-01-19 DIAGNOSIS — R5381 Other malaise: Secondary | ICD-10-CM | POA: Diagnosis present

## 2023-01-19 DIAGNOSIS — K5730 Diverticulosis of large intestine without perforation or abscess without bleeding: Secondary | ICD-10-CM | POA: Diagnosis present

## 2023-01-19 DIAGNOSIS — B961 Klebsiella pneumoniae [K. pneumoniae] as the cause of diseases classified elsewhere: Secondary | ICD-10-CM | POA: Diagnosis not present

## 2023-01-19 DIAGNOSIS — I129 Hypertensive chronic kidney disease with stage 1 through stage 4 chronic kidney disease, or unspecified chronic kidney disease: Secondary | ICD-10-CM | POA: Diagnosis present

## 2023-01-19 DIAGNOSIS — Z87891 Personal history of nicotine dependence: Secondary | ICD-10-CM | POA: Diagnosis not present

## 2023-01-19 DIAGNOSIS — K5750 Diverticulosis of both small and large intestine without perforation or abscess without bleeding: Secondary | ICD-10-CM | POA: Diagnosis not present

## 2023-01-19 DIAGNOSIS — R059 Cough, unspecified: Secondary | ICD-10-CM | POA: Diagnosis not present

## 2023-01-19 DIAGNOSIS — I96 Gangrene, not elsewhere classified: Secondary | ICD-10-CM | POA: Diagnosis not present

## 2023-01-19 DIAGNOSIS — Z1152 Encounter for screening for COVID-19: Secondary | ICD-10-CM

## 2023-01-19 DIAGNOSIS — R7401 Elevation of levels of liver transaminase levels: Secondary | ICD-10-CM | POA: Diagnosis not present

## 2023-01-19 DIAGNOSIS — R309 Painful micturition, unspecified: Secondary | ICD-10-CM | POA: Diagnosis present

## 2023-01-19 DIAGNOSIS — E663 Overweight: Secondary | ICD-10-CM | POA: Diagnosis present

## 2023-01-19 DIAGNOSIS — K838 Other specified diseases of biliary tract: Secondary | ICD-10-CM | POA: Diagnosis not present

## 2023-01-19 DIAGNOSIS — N401 Enlarged prostate with lower urinary tract symptoms: Secondary | ICD-10-CM | POA: Diagnosis not present

## 2023-01-19 DIAGNOSIS — N400 Enlarged prostate without lower urinary tract symptoms: Secondary | ICD-10-CM | POA: Diagnosis present

## 2023-01-19 DIAGNOSIS — K8042 Calculus of bile duct with acute cholecystitis without obstruction: Principal | ICD-10-CM

## 2023-01-19 DIAGNOSIS — E559 Vitamin D deficiency, unspecified: Secondary | ICD-10-CM | POA: Diagnosis present

## 2023-01-19 DIAGNOSIS — R319 Hematuria, unspecified: Secondary | ICD-10-CM | POA: Diagnosis present

## 2023-01-19 LAB — URINALYSIS, W/ REFLEX TO CULTURE (INFECTION SUSPECTED)
Bilirubin Urine: NEGATIVE
Glucose, UA: NEGATIVE mg/dL
Ketones, ur: NEGATIVE mg/dL
Leukocytes,Ua: NEGATIVE
Nitrite: NEGATIVE
Protein, ur: 30 mg/dL — AB
Specific Gravity, Urine: 1.014 (ref 1.005–1.030)
pH: 5 (ref 5.0–8.0)

## 2023-01-19 LAB — LACTIC ACID, PLASMA
Lactic Acid, Venous: 2.6 mmol/L (ref 0.5–1.9)
Lactic Acid, Venous: 1.6 mmol/L (ref 0.5–1.9)

## 2023-01-19 LAB — HEPATIC FUNCTION PANEL
ALT: 242 U/L — ABNORMAL HIGH (ref 0–44)
AST: 272 U/L — ABNORMAL HIGH (ref 15–41)
Albumin: 4.2 g/dL (ref 3.5–5.0)
Alkaline Phosphatase: 311 U/L — ABNORMAL HIGH (ref 38–126)
Bilirubin, Direct: 1.3 mg/dL — ABNORMAL HIGH (ref 0.0–0.2)
Indirect Bilirubin: 0.9 mg/dL (ref 0.3–0.9)
Total Bilirubin: 2.2 mg/dL — ABNORMAL HIGH (ref 0.0–1.2)
Total Protein: 8 g/dL (ref 6.5–8.1)

## 2023-01-19 LAB — CBC
HCT: 47.3 % (ref 39.0–52.0)
Hemoglobin: 14.8 g/dL (ref 13.0–17.0)
MCH: 28.2 pg (ref 26.0–34.0)
MCHC: 31.3 g/dL (ref 30.0–36.0)
MCV: 90.1 fL (ref 80.0–100.0)
Platelets: 180 10*3/uL (ref 150–400)
RBC: 5.25 MIL/uL (ref 4.22–5.81)
RDW: 14.8 % (ref 11.5–15.5)
WBC: 7.4 10*3/uL (ref 4.0–10.5)
nRBC: 0 % (ref 0.0–0.2)

## 2023-01-19 LAB — CK: Total CK: 238 U/L (ref 49–397)

## 2023-01-19 LAB — BASIC METABOLIC PANEL
Anion gap: 13 (ref 5–15)
BUN: 13 mg/dL (ref 8–23)
CO2: 24 mmol/L (ref 22–32)
Calcium: 9.2 mg/dL (ref 8.9–10.3)
Chloride: 100 mmol/L (ref 98–111)
Creatinine, Ser: 1.07 mg/dL (ref 0.61–1.24)
GFR, Estimated: >60 mL/min (ref >60)
Glucose, Bld: 124 mg/dL — ABNORMAL HIGH (ref 70–99)
Potassium: 4.1 mmol/L (ref 3.5–5.1)
Sodium: 137 mmol/L (ref 135–145)

## 2023-01-19 LAB — RESP PANEL BY RT-PCR (RSV, FLU A&B, COVID)  RVPGX2
Influenza A by PCR: NEGATIVE
Influenza B by PCR: NEGATIVE
Resp Syncytial Virus by PCR: NEGATIVE
SARS Coronavirus 2 by RT PCR: NEGATIVE

## 2023-01-19 LAB — LIPASE, BLOOD: Lipase: 29 U/L (ref 11–51)

## 2023-01-19 MED ORDER — GADOBUTROL 1 MMOL/ML IV SOLN
10.0000 mL | Freq: Once | INTRAVENOUS | Status: AC | PRN
  Administered 2023-01-19: 10 mL via INTRAVENOUS

## 2023-01-19 MED ORDER — ACETAMINOPHEN 325 MG PO TABS
650.0000 mg | ORAL_TABLET | Freq: Once | ORAL | Status: AC
  Administered 2023-01-19: 650 mg via ORAL
  Filled 2023-01-19: qty 2

## 2023-01-19 MED ORDER — LACTATED RINGERS IV BOLUS
1000.0000 mL | Freq: Once | INTRAVENOUS | Status: AC
  Administered 2023-01-19: 1000 mL via INTRAVENOUS

## 2023-01-19 MED ORDER — PIPERACILLIN-TAZOBACTAM 3.375 G IVPB 30 MIN
3.3750 g | Freq: Once | INTRAVENOUS | Status: AC
  Administered 2023-01-19: 3.375 g via INTRAVENOUS
  Filled 2023-01-19 (×2): qty 50

## 2023-01-19 NOTE — ED Notes (Signed)
 [facility] radiology was called per Dr.Mumma for films to be read

## 2023-01-19 NOTE — ED Provider Notes (Addendum)
 [facility] Provider Note    Event Date/Time   First MD Initiated Contact with Patient 01/19/23 1504     (approximate)   History   Chills   HPI  Blake Rivera is a 89 y.o. male presents to the emergency department for not feeling well.  Started having chills today.  Patient states that he has been having some dark and bloody urine and some burning with urination.  Called in doxycycline  from his primary care physician office and just took his first dose today.  Denies any known fever.  Does endorse congestion.  Denies any significant chest pain or shortness of breath.  Denies abdominal pain, nausea or vomiting or diarrhea.  No recent antibiotic use prior to doxycycline .  No history of resistant urinary tract infection.  Not on anticoagulation.     Physical Exam   Triage Vital Signs: ED Triage Vitals  Encounter Vitals Group     BP 01/19/23 1415 126/80     Systolic BP Percentile --      Diastolic BP Percentile --      Pulse Rate 01/19/23 1415 60     Resp 01/19/23 1415 17     Temp 01/19/23 1415 100.2 F (37.9 C)     Temp src --      SpO2 01/19/23 1415 90 %     Weight 01/19/23 1414 216 lb (98 kg)     Height 01/19/23 1414 6' (1.829 m)     Head Circumference --      Peak Flow --      Pain Score 01/19/23 1414 0     Pain Loc --      Pain Education --      Exclude from Growth Chart --     Most recent vital signs: Vitals:   01/19/23 1415 01/19/23 1820  BP: 126/80 120/76  Pulse: 60 72  Resp: 17 18  Temp: 100.2 F (37.9 C) 99.8 F (37.7 C)  SpO2: 90% 94%    Physical Exam Constitutional:      Appearance: He is well-developed.  HENT:     Head: Atraumatic.  Eyes:     Conjunctiva/sclera: Conjunctivae normal.  Cardiovascular:     Rate and Rhythm: Regular rhythm.  Pulmonary:     Effort: No respiratory distress.  Abdominal:     Tenderness: There is no abdominal tenderness. There is no right CVA tenderness or left CVA tenderness.   Musculoskeletal:        General: Normal range of motion.     Cervical back: Normal range of motion.  Skin:    General: Skin is warm.     Capillary Refill: Capillary refill takes less than 2 seconds.  Neurological:     General: No focal deficit present.     Mental Status: He is alert. Mental status is at baseline.     IMPRESSION / MDM / ASSESSMENT AND PLAN / ED COURSE  I reviewed the triage vital signs and the nursing notes.  Differential diagnosis including COVID/influenza, urinary tract infection, dehydration, electrolyte abnormality, bacterial pneumonia   RADIOLOGY I independently reviewed imaging, my interpretation of imaging: CT scan of the abdomen and pelvis without contrast obtained.  Noted common bile duct dilation and some inflammation around the gallbladder. MRCP obtained, concerning for choledocholithiasis with possible acute cholecystitis  LABS (all labs ordered are listed, but only abnormal results are displayed) Labs interpreted as -    Labs Reviewed  BASIC METABOLIC PANEL - Abnormal; Notable for  the following components:      Result Value   Glucose, Bld 124 (*)    All other components within normal limits  LACTIC ACID, PLASMA - Abnormal; Notable for the following components:   Lactic Acid, Venous 2.6 (*)    All other components within normal limits  URINALYSIS, W/ REFLEX TO CULTURE (INFECTION SUSPECTED) - Abnormal; Notable for the following components:   Color, Urine YELLOW (*)    APPearance CLEAR (*)    Hgb urine dipstick MODERATE (*)    Protein, ur 30 (*)    Bacteria, UA RARE (*)    All other components within normal limits  HEPATIC FUNCTION PANEL - Abnormal; Notable for the following components:   AST 272 (*)    ALT 242 (*)    Alkaline Phosphatase 311 (*)    Total Bilirubin 2.2 (*)    Bilirubin, Direct 1.3 (*)    All other components within normal limits  RESP PANEL BY RT-PCR (RSV, FLU A&B, COVID)  [id number]  CULTURE, BLOOD (SINGLE)  CBC  LACTIC  ACID, PLASMA  CK  LIPASE, BLOOD     MDM   Clinical Course as of 01/20/23 0010  Fri Jan 19, 2023  2331 I consult with Dr. Aundria [DS]  2349 I consult with hospitalist who agrees to admit [DS]    Clinical Course User Index [DS] Claudene Rover, MD  Patient with no leukocytosis.  Initial lactic acid elevated at 2.6.  No significant electrolyte abnormality.  Patient given 1 L of IV fluids and will reevaluate in lactic acid and for improvement of symptoms.  COVID and influenza testing are negative.  UA no signs of urinary tract infection.  No signs of rhabdomyolysis.  CT scan noncontrasted study with no signs of kidney stones.  Did show some stranding and common bile duct dilation.  Added on LFTs and did have an elevated LFTs and T. bili.  Ordered MRCP to further evaluate for possible choledocholithiasis.  Does have some mild right upper quadrant abdominal tenderness to palpation.  MRCP concerning for choledocholithiasis and possible acute cholecystitis.  Given IV Zosyn .   PROCEDURES:  Critical Care performed: yes  .Critical Care  Performed by: Suzanne Kirsch, MD Authorized by: Suzanne Kirsch, MD   Critical care provider statement:    Critical care time (minutes):  30   Critical care time was exclusive of:  Separately billable procedures and treating other patients   Critical care was time spent personally by me on the following activities:  Development of treatment plan with patient or surrogate, discussions with consultants, evaluation of patient's response to treatment, examination of patient, ordering and review of laboratory studies, ordering and review of radiographic studies, ordering and performing treatments and interventions, pulse oximetry, re-evaluation of patient's condition and review of old charts   Care discussed with: admitting provider     Patient's presentation is most consistent with acute presentation with potential threat to life or bodily  function.   MEDICATIONS ORDERED IN ED: Medications  acetaminophen  (TYLENOL ) tablet 650 mg (650 mg Oral Given 01/19/23 1424)  lactated ringers  bolus 1,000 mL (0 mLs Intravenous Stopped 01/19/23 1733)  gadobutrol  (GADAVIST ) 1 MMOL/ML injection 10 mL (10 mLs Intravenous Contrast Given 01/19/23 2103)  piperacillin -tazobactam (ZOSYN ) IVPB 3.375 g (0 g Intravenous Stopped 01/20/23 0010)    FINAL CLINICAL IMPRESSION(S) / ED DIAGNOSES   Final diagnoses:  Choledocholithiasis with acute cholecystitis     Rx / DC Orders   ED Discharge Orders     None  Note:  This document was prepared using Dragon voice recognition software and may include unintentional dictation errors.   Suzanne Kirsch, MD 01/20/23 VELVET    Suzanne Kirsch, MD 01/20/23 0010

## 2023-01-19 NOTE — ED Notes (Signed)
 Pt also states he is on doxycycline for blood in his urine . Pt was seen at pcp yesterday. Pt states he had burning last Thursday.

## 2023-01-19 NOTE — ED Triage Notes (Signed)
 Pt comes with c/o chills and shakes that started about 2 hours ago. Pt denies any cough or congestion. Pt denies any other symptoms.

## 2023-01-20 DIAGNOSIS — N182 Chronic kidney disease, stage 2 (mild): Secondary | ICD-10-CM | POA: Diagnosis present

## 2023-01-20 DIAGNOSIS — Z7984 Long term (current) use of oral hypoglycemic drugs: Secondary | ICD-10-CM | POA: Diagnosis not present

## 2023-01-20 DIAGNOSIS — I129 Hypertensive chronic kidney disease with stage 1 through stage 4 chronic kidney disease, or unspecified chronic kidney disease: Secondary | ICD-10-CM | POA: Diagnosis present

## 2023-01-20 DIAGNOSIS — K5730 Diverticulosis of large intestine without perforation or abscess without bleeding: Secondary | ICD-10-CM | POA: Diagnosis present

## 2023-01-20 DIAGNOSIS — R5381 Other malaise: Secondary | ICD-10-CM | POA: Diagnosis present

## 2023-01-20 DIAGNOSIS — K819 Cholecystitis, unspecified: Secondary | ICD-10-CM | POA: Diagnosis not present

## 2023-01-20 DIAGNOSIS — R7401 Elevation of levels of liver transaminase levels: Secondary | ICD-10-CM | POA: Diagnosis present

## 2023-01-20 DIAGNOSIS — Z6828 Body mass index (BMI) 28.0-28.9, adult: Secondary | ICD-10-CM | POA: Diagnosis not present

## 2023-01-20 DIAGNOSIS — Z8249 Family history of ischemic heart disease and other diseases of the circulatory system: Secondary | ICD-10-CM | POA: Diagnosis not present

## 2023-01-20 DIAGNOSIS — R7989 Other specified abnormal findings of blood chemistry: Secondary | ICD-10-CM | POA: Diagnosis not present

## 2023-01-20 DIAGNOSIS — K660 Peritoneal adhesions (postprocedural) (postinfection): Secondary | ICD-10-CM | POA: Diagnosis present

## 2023-01-20 DIAGNOSIS — K8042 Calculus of bile duct with acute cholecystitis without obstruction: Principal | ICD-10-CM

## 2023-01-20 DIAGNOSIS — E663 Overweight: Secondary | ICD-10-CM | POA: Diagnosis present

## 2023-01-20 DIAGNOSIS — Z87891 Personal history of nicotine dependence: Secondary | ICD-10-CM | POA: Diagnosis not present

## 2023-01-20 DIAGNOSIS — K8065 Calculus of gallbladder and bile duct with chronic cholecystitis with obstruction: Secondary | ICD-10-CM | POA: Diagnosis present

## 2023-01-20 DIAGNOSIS — E1165 Type 2 diabetes mellitus with hyperglycemia: Secondary | ICD-10-CM | POA: Diagnosis present

## 2023-01-20 DIAGNOSIS — Z794 Long term (current) use of insulin: Secondary | ICD-10-CM | POA: Diagnosis not present

## 2023-01-20 DIAGNOSIS — N400 Enlarged prostate without lower urinary tract symptoms: Secondary | ICD-10-CM | POA: Diagnosis present

## 2023-01-20 DIAGNOSIS — E785 Hyperlipidemia, unspecified: Secondary | ICD-10-CM | POA: Diagnosis present

## 2023-01-20 DIAGNOSIS — Z825 Family history of asthma and other chronic lower respiratory diseases: Secondary | ICD-10-CM | POA: Diagnosis not present

## 2023-01-20 DIAGNOSIS — B961 Klebsiella pneumoniae [K. pneumoniae] as the cause of diseases classified elsewhere: Secondary | ICD-10-CM | POA: Diagnosis present

## 2023-01-20 DIAGNOSIS — K8050 Calculus of bile duct without cholangitis or cholecystitis without obstruction: Secondary | ICD-10-CM | POA: Diagnosis not present

## 2023-01-20 DIAGNOSIS — Z1152 Encounter for screening for COVID-19: Secondary | ICD-10-CM | POA: Diagnosis not present

## 2023-01-20 DIAGNOSIS — I96 Gangrene, not elsewhere classified: Secondary | ICD-10-CM | POA: Diagnosis present

## 2023-01-20 DIAGNOSIS — K8013 Calculus of gallbladder with acute and chronic cholecystitis with obstruction: Secondary | ICD-10-CM | POA: Diagnosis not present

## 2023-01-20 DIAGNOSIS — E1122 Type 2 diabetes mellitus with diabetic chronic kidney disease: Secondary | ICD-10-CM | POA: Diagnosis present

## 2023-01-20 DIAGNOSIS — R7881 Bacteremia: Secondary | ICD-10-CM | POA: Diagnosis not present

## 2023-01-20 DIAGNOSIS — Z888 Allergy status to other drugs, medicaments and biological substances status: Secondary | ICD-10-CM | POA: Diagnosis not present

## 2023-01-20 DIAGNOSIS — Z79899 Other long term (current) drug therapy: Secondary | ICD-10-CM | POA: Diagnosis not present

## 2023-01-20 DIAGNOSIS — N401 Enlarged prostate with lower urinary tract symptoms: Secondary | ICD-10-CM | POA: Diagnosis not present

## 2023-01-20 LAB — BLOOD CULTURE ID PANEL (REFLEXED) - BCID2

## 2023-01-20 LAB — COMPREHENSIVE METABOLIC PANEL
ALT: 201 U/L — ABNORMAL HIGH (ref 0–44)
AST: 143 U/L — ABNORMAL HIGH (ref 15–41)
Albumin: 3.4 g/dL — ABNORMAL LOW (ref 3.5–5.0)
Alkaline Phosphatase: 258 U/L — ABNORMAL HIGH (ref 38–126)
Anion gap: 8 (ref 5–15)
BUN: 15 mg/dL (ref 8–23)
CO2: 27 mmol/L (ref 22–32)
Calcium: 9 mg/dL (ref 8.9–10.3)
Chloride: 106 mmol/L (ref 98–111)
Creatinine, Ser: 1.27 mg/dL — ABNORMAL HIGH (ref 0.61–1.24)
GFR, Estimated: 55 mL/min — ABNORMAL LOW (ref >60)
Glucose, Bld: 88 mg/dL (ref 70–99)
Potassium: 4.5 mmol/L (ref 3.5–5.1)
Sodium: 141 mmol/L (ref 135–145)
Total Bilirubin: 2.2 mg/dL — ABNORMAL HIGH (ref 0.0–1.2)
Total Protein: 6.9 g/dL (ref 6.5–8.1)

## 2023-01-20 LAB — CBC WITH DIFFERENTIAL/PLATELET
Abs Immature Granulocytes: 0.04 10*3/uL (ref 0.00–0.07)
Basophils Absolute: 0 10*3/uL (ref 0.0–0.1)
Basophils Relative: 0 %
Eosinophils Absolute: 0 10*3/uL (ref 0.0–0.5)
Eosinophils Relative: 0 %
HCT: 40.9 % (ref 39.0–52.0)
Hemoglobin: 13.2 g/dL (ref 13.0–17.0)
Immature Granulocytes: 0 %
Lymphocytes Relative: 9 %
Lymphs Abs: 1.2 10*3/uL (ref 0.7–4.0)
MCH: 28 pg (ref 26.0–34.0)
MCHC: 32.3 g/dL (ref 30.0–36.0)
MCV: 86.7 fL (ref 80.0–100.0)
Monocytes Absolute: 0.9 10*3/uL (ref 0.1–1.0)
Monocytes Relative: 7 %
Neutro Abs: 11.1 10*3/uL — ABNORMAL HIGH (ref 1.7–7.7)
Neutrophils Relative %: 84 %
Platelets: 180 10*3/uL (ref 150–400)
RBC: 4.72 MIL/uL (ref 4.22–5.81)
RDW: 15 % (ref 11.5–15.5)
WBC: 13.3 10*3/uL — ABNORMAL HIGH (ref 4.0–10.5)
nRBC: 0 % (ref 0.0–0.2)

## 2023-01-20 LAB — CULTURE, URINE COMPREHENSIVE
MICRO NUMBER:: 15912036
SPECIMEN QUALITY:: ADEQUATE

## 2023-01-20 MED ORDER — AMLODIPINE BESYLATE 10 MG PO TABS
10.0000 mg | ORAL_TABLET | Freq: Every day | ORAL | Status: DC
  Administered 2023-01-20 – 2023-01-24 (×5): 10 mg via ORAL
  Filled 2023-01-20 (×2): qty 1
  Filled 2023-01-20: qty 2
  Filled 2023-01-20 (×2): qty 1

## 2023-01-20 MED ORDER — TAMSULOSIN HCL 0.4 MG PO CAPS
0.8000 mg | ORAL_CAPSULE | Freq: Every day | ORAL | Status: DC
  Administered 2023-01-20 – 2023-01-24 (×5): 0.8 mg via ORAL
  Filled 2023-01-20 (×5): qty 2

## 2023-01-20 MED ORDER — SODIUM CHLORIDE 0.9 % IV SOLN
2.0000 g | INTRAVENOUS | Status: DC
  Administered 2023-01-20 – 2023-01-24 (×5): 2 g via INTRAVENOUS
  Filled 2023-01-20 (×5): qty 20

## 2023-01-20 MED ORDER — ATORVASTATIN CALCIUM 10 MG PO TABS
10.0000 mg | ORAL_TABLET | Freq: Every day | ORAL | Status: DC
  Administered 2023-01-20 – 2023-01-24 (×5): 10 mg via ORAL
  Filled 2023-01-20 (×5): qty 1

## 2023-01-20 MED ORDER — PIPERACILLIN-TAZOBACTAM 3.375 G IVPB: 3.3750 g | Freq: Three times a day (TID) | INTRAVENOUS | Status: DC

## 2023-01-20 MED ORDER — HYDROMORPHONE HCL 1 MG/ML IJ SOLN
0.5000 mg | INTRAMUSCULAR | Status: DC | PRN
  Administered 2023-01-22: 0.5 mg via INTRAVENOUS
  Filled 2023-01-20: qty 0.5

## 2023-01-20 MED ORDER — ENOXAPARIN SODIUM 40 MG/0.4ML IJ SOSY
40.0000 mg | PREFILLED_SYRINGE | INTRAMUSCULAR | Status: DC
  Administered 2023-01-20 – 2023-01-24 (×5): 40 mg via SUBCUTANEOUS
  Filled 2023-01-20 (×5): qty 0.4

## 2023-01-20 MED ORDER — MELATONIN 5 MG PO TABS: 5.0000 mg | ORAL_TABLET | Freq: Every evening | ORAL | Status: DC | PRN

## 2023-01-20 NOTE — Progress Notes (Signed)
 PHARMACY - PHYSICIAN COMMUNICATION CRITICAL VALUE ALERT - BLOOD CULTURE IDENTIFICATION (BCID)  Blake Rivera is an 89 y.o. male who presented to [facility] on 01/19/2023 with a chief complaint of choledocholithiasis,  acute cystitis   Assessment:  Kleb pneumo in 2 of 2 bottles, no resistance  (include suspected source if known)  Name of physician (or Provider) Contacted: Marval Ely, MD   Current antibiotics: Zosyn  3.375 gm IV Q8H   Changes to prescribed antibiotics recommended:  Recommendations accepted by provider  - will d/c zosyn  and start ceftriaxone  2 gm IV Q24H  - MD does not want to add metronidazole   Results for orders placed or performed during the hospital encounter of 01/19/23  Blood Culture ID Panel (Reflexed) (Collected: 01/19/2023  3:24 PM)  Result Value Ref Range   Enterococcus faecalis NOT DETECTED NOT DETECTED   Enterococcus Faecium NOT DETECTED NOT DETECTED   Listeria monocytogenes NOT DETECTED NOT DETECTED   Staphylococcus species NOT DETECTED NOT DETECTED   Staphylococcus aureus (BCID) NOT DETECTED NOT DETECTED   Staphylococcus epidermidis NOT DETECTED NOT DETECTED   Staphylococcus lugdunensis NOT DETECTED NOT DETECTED   Streptococcus species NOT DETECTED NOT DETECTED   Streptococcus agalactiae NOT DETECTED NOT DETECTED   Streptococcus pneumoniae NOT DETECTED NOT DETECTED   Streptococcus pyogenes NOT DETECTED NOT DETECTED   A.calcoaceticus-baumannii NOT DETECTED NOT DETECTED   Bacteroides fragilis NOT DETECTED NOT DETECTED   Enterobacterales DETECTED (A) NOT DETECTED   Enterobacter cloacae complex NOT DETECTED NOT DETECTED   Escherichia coli NOT DETECTED NOT DETECTED   Klebsiella aerogenes NOT DETECTED NOT DETECTED   Klebsiella oxytoca NOT DETECTED NOT DETECTED   Klebsiella pneumoniae DETECTED (A) NOT DETECTED   Proteus species NOT DETECTED NOT DETECTED   Salmonella species NOT DETECTED NOT DETECTED   Serratia marcescens NOT DETECTED NOT DETECTED    Haemophilus influenzae NOT DETECTED NOT DETECTED   Neisseria meningitidis NOT DETECTED NOT DETECTED   Pseudomonas aeruginosa NOT DETECTED NOT DETECTED   Stenotrophomonas maltophilia NOT DETECTED NOT DETECTED   Candida albicans NOT DETECTED NOT DETECTED   Candida auris NOT DETECTED NOT DETECTED   Candida glabrata NOT DETECTED NOT DETECTED   Candida krusei NOT DETECTED NOT DETECTED   Candida parapsilosis NOT DETECTED NOT DETECTED   Candida tropicalis NOT DETECTED NOT DETECTED   Cryptococcus neoformans/gattii NOT DETECTED NOT DETECTED   CTX-M ESBL NOT DETECTED NOT DETECTED   Carbapenem resistance IMP NOT DETECTED NOT DETECTED   Carbapenem resistance KPC NOT DETECTED NOT DETECTED   Carbapenem resistance NDM NOT DETECTED NOT DETECTED   Carbapenem resist OXA 48 LIKE NOT DETECTED NOT DETECTED   Carbapenem resistance VIM NOT DETECTED NOT DETECTED    [name] D 01/20/2023  5:04 AM

## 2023-01-20 NOTE — Consult Note (Signed)
 Subjective:   CC: choledocolithiasis  HPI:  Blake Rivera is a 89 y.o. male who is consulted by [facility] for evaluation of above cc.  Symptoms were first noted a few days ago. Minimal pain but malaise. Imaging as below.    Past Medical History:  has a past medical history of Diverticulitis of colon, ED (erectile dysfunction), Hyperglycemia, Hypertension, and Vitamin D  deficiency.  Past Surgical History:  has a past surgical history that includes Hernia repair and Rotator cuff repair (03/17/2018).  Family History: family history includes Aneurysm in his mother; Emphysema in his father; Healthy in his brother, brother, and sister; Heart disease in his brother, mother, and sister.  Social History:  reports that he quit smoking about 8 years ago. His smoking use included cigarettes. He started smoking about 33 years ago. He has a 6.3 pack-year smoking history. He has never used smokeless tobacco. He reports current alcohol use of about 1.0 standard drink of alcohol per week. He reports that he does not use drugs.  Current Medications:  Prior to Admission medications   Medication Sig Start Date End Date Taking? Authorizing Provider  amLODipine  (NORVASC ) 10 MG tablet Take 1 tablet (10 mg total) by mouth daily. 01/26/22  Yes [name], DO  atorvastatin  (LIPITOR) 10 MG tablet Take 1 tablet (10 mg total) by mouth daily. 01/26/22  Yes [name], DO  doxycycline  (ADOXA) 100 MG tablet Take 100 mg by mouth 2 (two) times daily. 7 day course.   Yes Sowles, Krichna, MD  metFORMIN  (GLUCOPHAGE ) 500 MG tablet Take 500 mg by mouth daily with breakfast. 08/19/22  Yes [provider]  tamsulosin  (FLOMAX ) 0.4 MG CAPS capsule Take 2 capsules (0.8 mg total) by mouth daily. 01/26/22  Yes [name], DO    Allergies:  Allergies as of 01/19/2023 - Review Complete 01/19/2023  Allergen Reaction Noted   Lisinopril  12/22/2013   Omacor  [omega-3-acid ethyl esters (fish)]  12/22/2013    Zocor  [simvastatin]  12/22/2013    ROS:  General: Denies weight loss, weight gain,  and night sweats. Eyes: Denies blurry vision, double vision, eye pain, itchy eyes, and tearing. Ears: Denies hearing loss, earache, and ringing in ears. Nose: Denies sinus pain, congestion, infections, runny nose, and nosebleeds. Mouth/throat: Denies hoarseness, sore throat, bleeding gums, and difficulty swallowing. Heart: Denies chest pain, palpitations, racing heart, irregular heartbeat, leg pain or swelling, and decreased activity tolerance. Respiratory: Denies breathing difficulty, shortness of breath, wheezing, cough, and sputum. GI: Denies change in appetite, heartburn, nausea, vomiting, constipation, diarrhea, and blood in stool. GU: Denies difficulty urinating, pain with urinating, urgency, frequency, blood in urine. Musculoskeletal: Denies joint stiffness, pain, swelling, muscle weakness. Skin: Denies rash, itching, mass, tumors, sores, and boils Neurologic: Denies headache, fainting, dizziness, seizures, numbness, and tingling. Psychiatric: Denies depression, anxiety, difficulty sleeping, and memory loss. Endocrine: Denies heat or cold intolerance, and increased thirst or urination. Blood/lymph: Denies easy bruising, and swollen glands     Objective:     BP (!) 149/59 (BP Location: Right Arm)   Pulse 60   Temp 98.3 F (36.8 C) (Oral)   Resp 18   Ht 6' (1.829 m)   Wt 98 kg   SpO2 96%   BMI 29.29 kg/m    Constitutional :  alert, cooperative, appears stated age, and no distress  Lymphatics/Throat:  no asymmetry, masses, or scars  Respiratory:  clear to auscultation bilaterally  Cardiovascular:  regular rate and rhythm  Gastrointestinal: soft, non-tender; bowel sounds normal;  no masses,  no organomegaly.   Musculoskeletal: Steady movement  Skin: Cool and moist  Psychiatric: Normal affect, non-agitated, not confused       LABS:     Latest Ref Rng & Units 01/20/2023    5:15 AM  01/19/2023    4:47 PM 01/19/2023    2:23 PM  CMP  Glucose 70 - 99 mg/dL 88   875   BUN 8 - 23 mg/dL 15   13   Creatinine 9.38 - 1.24 mg/dL 8.72   8.92   Sodium 864 - 145 mmol/L 141   137   Potassium 3.5 - 5.1 mmol/L 4.5   4.1   Chloride 98 - 111 mmol/L 106   100   CO2 22 - 32 mmol/L 27   24   Calcium  8.9 - 10.3 mg/dL 9.0   9.2   Total Protein 6.5 - 8.1 g/dL 6.9  8.0    Total Bilirubin 0.0 - 1.2 mg/dL 2.2  2.2    Alkaline Phos 38 - 126 U/L 258  311    AST 15 - 41 U/L 143  272    ALT 0 - 44 U/L 201  242        Latest Ref Rng & Units 01/20/2023    5:15 AM 01/19/2023    2:23 PM 01/26/2022    9:35 AM  CBC  WBC 4.0 - 10.5 K/uL 13.3  7.4  4.5   Hemoglobin 13.0 - 17.0 g/dL 86.7  85.1  85.3   Hematocrit 39.0 - 52.0 % 40.9  47.3  45.0   Platelets 150 - 400 K/uL 180  180  166      RADS: CLINICAL DATA:  Right upper quadrant abdominal pain   EXAM: MRI ABDOMEN WITHOUT AND WITH CONTRAST (INCLUDING MRCP)   TECHNIQUE: Multiplanar multisequence MR imaging of the abdomen was performed both before and after the administration of intravenous contrast. Heavily T2-weighted images of the biliary and pancreatic ducts were obtained, and three-dimensional MRCP images were rendered by post processing.   CONTRAST:  10mL GADAVIST  GADOBUTROL  1 MMOL/ML IV SOLN   COMPARISON:  CT abdomen pelvis, 01/19/2023   FINDINGS: Lower chest: No acute abnormality.   Hepatobiliary: No solid liver abnormality is seen. Numerous small gallstones contracted in the gallbladder. Mild gallbladder wall thickening and trace pericholecystic fluid. Intra and extrahepatic biliary ductal dilatation, the common bile duct measuring up to 1.2 cm in caliber. At least three small gallstones in the common bile duct within the pancreatic head, including the largest at the ampulla measuring 0.7 cm (series 3, image 17, series 8, image 27).   Pancreas: Unremarkable. No pancreatic ductal dilatation or surrounding inflammatory  changes.   Spleen: Normal in size without significant abnormality.   Adrenals/Urinary Tract: Adrenal glands are unremarkable. Simple, benign left renal cortical cysts, for which no further follow-up or characterization is required. Kidneys are otherwise normal, without obvious renal calculi, solid lesion, or hydronephrosis.   Stomach/Bowel: Stomach is within normal limits. No evidence of bowel wall thickening, distention, or inflammatory changes. Descending and sigmoid diverticulosis.   Vascular/Lymphatic: No significant vascular findings are present. No enlarged abdominal lymph nodes.   Other: No abdominal wall hernia or abnormality. No ascites.   Musculoskeletal: No acute or significant osseous findings.   IMPRESSION: 1. Cholelithiasis with mild gallbladder wall thickening and trace pericholecystic fluid. 2. Choledocholithiasis with intra and extrahepatic biliary ductal dilatation. At least three small gallstones in the common bile duct within the pancreatic head, including the largest  at the ampulla measuring 0.7 cm. 3. Descending and sigmoid diverticulosis.   Preliminary communication of findings by Dr. Scott to Dr. Suzanne, 10:30 PM 01/19/2023.     Electronically Signed   By: Marolyn JONETTA Jaksch M.D.   On: 01/19/2023 23:00 Assessment:      Choledocolithiasis- recommend robo lap chole after ERCP to prevent recurrence  Plan:      Discussed the risk of surgery including post-op infxn, seroma, biloma, chronic pain, poor-delayed wound healing, retained gallstone, conversion to open procedure, post-op SBO or ileus, and need for additional procedures to address said risks.  The risks of general anesthetic including MI, CVA, sudden death or even reaction to anesthetic medications also discussed. Alternatives include continued observation.  Benefits include possible symptom relief, prevention of complications including acute cholecystitis, pancreatitis.  Typical post operative  recovery of 3-5 days rest, continued pain in area and incision sites, possible loose stools up to 4-6 weeks, also discussed.  The patient understands the risks, any and all questions were answered to the patient's satisfaction.  Pt will like to think it over due to concerns with anesthesia and also undergoing major surgery at his age.  Will be available for additional questions or concerns.  Earliest time to proceed will be Tuesday, after the ERCP.  Continue current care per primary team  labs/images/medications/previous chart entries reviewed personally and relevant changes/updates noted above.

## 2023-01-20 NOTE — Progress Notes (Signed)
 Patient admitted after midnight, please see H&P:   Acute choledocholithiasis with possible acute cholecystitis //  Elevated LFTs -N.p.o., IV fluid hydration -Pain meds as needed -GI consulted -Antiemetics as needed Blood cultures positive for Kleb pna-- IV Abx   HTN (hypertension) -Norvasc  resumed   Dyslipidemia -Statins on hold with increased LFTs   BPH -Flomax  continued.  Patient is feeling well. Blake Bowl DO

## 2023-01-20 NOTE — H&P (Addendum)
 PCP:   [name], DO   Chief Complaint:  Severe chills  HPI: This is a 89 year old male with past medical history significant for CKD stage II, HLD, HTN.  Today he had severe chills that would not resolve.  He just could not get warm.  He denies fever, nausea, vomiting or diarrhea.  He reports some mild abdominal discomfort starting around Thanksgiving but nothing significant.  Today he has no abdominal discomfort.  In the ER vital stable Tmax 100.2.  Labs lactic acid 2.6 => 1.6.  Alk phos 311, AST 272, ALT 243, T. bili 1.3 [all previously normal], lipase 29.  Respiratory panel negative, UA negative.  MRCP consistent with choledocholithiasis.  CT abdomen and pelvis questions acute cholecystitis.  Blood cultures x 2 collected.  Patient given IV Zosyn .  GI on-call contacted.  Will see patient in a.m.  Review of Systems:  Per HPI  Past Medical History: Past Medical History:  Diagnosis Date   Diverticulitis of colon    ED (erectile dysfunction)    Hyperglycemia    Hypertension    Vitamin D  deficiency    Past Surgical History:  Procedure Laterality Date   HERNIA REPAIR     ROTATOR CUFF REPAIR  03/17/2018    Medications: Prior to Admission medications   Medication Sig Start Date End Date Taking? Authorizing Provider  amLODipine  (NORVASC ) 10 MG tablet Take 1 tablet (10 mg total) by mouth daily. 01/26/22   [name], DO  atorvastatin  (LIPITOR) 10 MG tablet Take 1 tablet (10 mg total) by mouth daily. 01/26/22   [name], DO  Cod Liver Oil OIL Take 1 tablet by mouth daily.    [provider]  doxycycline  (VIBRA -TABS) 100 MG tablet Take 1 tablet (100 mg total) by mouth 2 (two) times daily. 01/18/23   Sowles, Krichna, MD  tamsulosin  (FLOMAX ) 0.4 MG CAPS capsule Take 2 capsules (0.8 mg total) by mouth daily. 01/26/22   [name], DO    Allergies:   Allergies  Allergen Reactions   Lisinopril     Angioedema   Omacor  [Omega-3-Acid Ethyl Esters  (Fish)]     Other reaction(s): Rash   Zocor  [Simvastatin]     Muscle Pain    Social History:  reports that he quit smoking about 8 years ago. His smoking use included cigarettes. He started smoking about 33 years ago. He has a 6.3 pack-year smoking history. He has never used smokeless tobacco. He reports current alcohol use of about 1.0 standard drink of alcohol per week. He reports that he does not use drugs.  Family History: Family History  Problem Relation Age of Onset   Heart disease Mother    Aneurysm Mother    Heart disease Brother    Emphysema Father    Healthy Sister    Healthy Brother    Healthy Brother    Heart disease Sister     Physical Exam: Vitals:   01/19/23 1414 01/19/23 1415 01/19/23 1820  BP:  126/80 120/76  Pulse:  60 72  Resp:  17 18  Temp:  100.2 F (37.9 C) 99.8 F (37.7 C)  TempSrc:   Oral  SpO2:  90% 94%  Weight: 98 kg    Height: 6' (1.829 m)      General: A and O x 3, well developed and nourished, no acute distress Eyes: Pink conjunctiva, no scleral icterus ENT: Moist oral mucosa, neck supple, no thyromegaly Lungs: CTA B/L, no wheeze, no crackles, no use of  accessory muscles Cardiovascular: RRR, no regurgitation, no gallops, no murmurs. No carotid bruits, no JVD Abdomen: soft, positive BS, benign abdominal exam, not an acute abdomen GU: not examined Neuro: CN II - XII grossly intact, sensation intact Musculoskeletal: strength 5/5 all extremities, no clubbing, cyanosis or edema Skin: no rash, no subcutaneous crepitation, no decubitus Psych: appropriate patient  Labs on Admission:  Recent Labs    01/19/23 1423  NA 137  K 4.1  CL 100  CO2 24  GLUCOSE 124*  BUN 13  CREATININE 1.07  CALCIUM  9.2   Recent Labs    01/19/23 1647  AST 272*  ALT 242*  ALKPHOS 311*  BILITOT 2.2*  PROT 8.0  ALBUMIN 4.2   Recent Labs    01/19/23 1647  LIPASE 29   Recent Labs    01/19/23 1423  WBC 7.4  HGB 14.8  HCT 47.3  MCV 90.1  PLT 180    Recent Labs    01/19/23 1423  CKTOTAL 238    Micro Results: Recent Results (from the past 240 hours)  CULTURE, URINE COMPREHENSIVE     Status: None (Preliminary result)   Collection Time: 01/18/23 10:19 AM   Specimen: Urine  Result Value Ref Range Status   MICRO NUMBER: [id number]  Preliminary   SPECIMEN QUALITY: Adequate  Preliminary   Source OTHER (SPECIFY)  Preliminary   STATUS: PRELIMINARY  Preliminary   RESULT: Culture in progress  Preliminary  Resp panel by RT-PCR (RSV, Flu A&B, Covid) Anterior Nasal Swab     Status: None   Collection Time: 01/19/23  2:16 PM   Specimen: Anterior Nasal Swab  Result Value Ref Range Status   SARS Coronavirus 2 by RT PCR NEGATIVE NEGATIVE Final    Comment: (NOTE) SARS-CoV-2 target nucleic acids are NOT DETECTED.  The SARS-CoV-2 RNA is generally detectable in upper respiratory specimens during the acute phase of infection. The lowest concentration of SARS-CoV-2 viral copies this assay can detect is 138 copies/mL. A negative result does not preclude SARS-Cov-2 infection and should not be used as the sole basis for treatment or other patient management decisions. A negative result may occur with  improper specimen collection/handling, submission of specimen other than nasopharyngeal swab, presence of viral mutation(s) within the areas targeted by this assay, and inadequate number of viral copies(<138 copies/mL). A negative result must be combined with clinical observations, patient history, and epidemiological information. The expected result is Negative.  Fact Sheet for Patients:  bloggercourse.com  Fact Sheet for Healthcare Providers:  seriousbroker.it  This test is no t yet approved or cleared by the [place]  FDA and  has been authorized for detection and/or diagnosis of SARS-CoV-2 by FDA under an Emergency Use Authorization (EUA). This EUA will remain  in effect (meaning this  test can be used) for the duration of the COVID-19 declaration under Section 564(b)(1) of the Act, 21 U.S.C.section 360bbb-3(b)(1), unless the authorization is terminated  or revoked sooner.       Influenza A by PCR NEGATIVE NEGATIVE Final   Influenza B by PCR NEGATIVE NEGATIVE Final    Comment: (NOTE) The Xpert Xpress SARS-CoV-2/FLU/RSV plus assay is intended as an aid in the diagnosis of influenza from Nasopharyngeal swab specimens and should not be used as a sole basis for treatment. Nasal washings and aspirates are unacceptable for Xpert Xpress SARS-CoV-2/FLU/RSV testing.  Fact Sheet for Patients: bloggercourse.com  Fact Sheet for Healthcare Providers: seriousbroker.it  This test is not yet approved or cleared by the [place]   FDA and has been authorized for detection and/or diagnosis of SARS-CoV-2 by FDA under an Emergency Use Authorization (EUA). This EUA will remain in effect (meaning this test can be used) for the duration of the COVID-19 declaration under Section 564(b)(1) of the Act, 21 U.S.C. section 360bbb-3(b)(1), unless the authorization is terminated or revoked.     Resp Syncytial Virus by PCR NEGATIVE NEGATIVE Final    Comment: (NOTE) Fact Sheet for Patients: bloggercourse.com  Fact Sheet for Healthcare Providers: seriousbroker.it  This test is not yet approved or cleared by the [place]  FDA and has been authorized for detection and/or diagnosis of SARS-CoV-2 by FDA under an Emergency Use Authorization (EUA). This EUA will remain in effect (meaning this test can be used) for the duration of the COVID-19 declaration under Section 564(b)(1) of the Act, 21 U.S.C. section 360bbb-3(b)(1), unless the authorization is terminated or revoked.  Performed at [facility], [address] [place], [place] [address]      Radiological Exams on  Admission: MR ABDOMEN MRCP W WO CONTAST Result Date: 01/19/2023 CLINICAL DATA:  Right upper quadrant abdominal pain EXAM: MRI ABDOMEN WITHOUT AND WITH CONTRAST (INCLUDING MRCP) TECHNIQUE: Multiplanar multisequence MR imaging of the abdomen was performed both before and after the administration of intravenous contrast. Heavily T2-weighted images of the biliary and pancreatic ducts were obtained, and three-dimensional MRCP images were rendered by post processing. CONTRAST:  10mL GADAVIST  GADOBUTROL  1 MMOL/ML IV SOLN COMPARISON:  CT abdomen pelvis, 01/19/2023 FINDINGS: Lower chest: No acute abnormality. Hepatobiliary: No solid liver abnormality is seen. Numerous small gallstones contracted in the gallbladder. Mild gallbladder wall thickening and trace pericholecystic fluid. Intra and extrahepatic biliary ductal dilatation, the common bile duct measuring up to 1.2 cm in caliber. At least three small gallstones in the common bile duct within the pancreatic head, including the largest at the ampulla measuring 0.7 cm (series 3, image 17, series 8, image 27). Pancreas: Unremarkable. No pancreatic ductal dilatation or surrounding inflammatory changes. Spleen: Normal in size without significant abnormality. Adrenals/Urinary Tract: Adrenal glands are unremarkable. Simple, benign left renal cortical cysts, for which no further follow-up or characterization is required. Kidneys are otherwise normal, without obvious renal calculi, solid lesion, or hydronephrosis. Stomach/Bowel: Stomach is within normal limits. No evidence of bowel wall thickening, distention, or inflammatory changes. Descending and sigmoid diverticulosis. Vascular/Lymphatic: No significant vascular findings are present. No enlarged abdominal lymph nodes. Other: No abdominal wall hernia or abnormality. No ascites. Musculoskeletal: No acute or significant osseous findings. IMPRESSION: 1. Cholelithiasis with mild gallbladder wall thickening and trace  pericholecystic fluid. 2. Choledocholithiasis with intra and extrahepatic biliary ductal dilatation. At least three small gallstones in the common bile duct within the pancreatic head, including the largest at the ampulla measuring 0.7 cm. 3. Descending and sigmoid diverticulosis. Preliminary communication of findings by Dr. Scott to Dr. Suzanne, 10:30 PM 01/19/2023. Electronically Signed   By: Marolyn JONETTA Jaksch M.D.   On: 01/19/2023 23:00   MR 3D Recon At Scanner Result Date: 01/19/2023 CLINICAL DATA:  Right upper quadrant abdominal pain EXAM: MRI ABDOMEN WITHOUT AND WITH CONTRAST (INCLUDING MRCP) TECHNIQUE: Multiplanar multisequence MR imaging of the abdomen was performed both before and after the administration of intravenous contrast. Heavily T2-weighted images of the biliary and pancreatic ducts were obtained, and three-dimensional MRCP images were rendered by post processing. CONTRAST:  10mL GADAVIST  GADOBUTROL  1 MMOL/ML IV SOLN COMPARISON:  CT abdomen pelvis, 01/19/2023 FINDINGS: Lower chest: No acute abnormality. Hepatobiliary: No solid liver abnormality is seen.  Numerous small gallstones contracted in the gallbladder. Mild gallbladder wall thickening and trace pericholecystic fluid. Intra and extrahepatic biliary ductal dilatation, the common bile duct measuring up to 1.2 cm in caliber. At least three small gallstones in the common bile duct within the pancreatic head, including the largest at the ampulla measuring 0.7 cm (series 3, image 17, series 8, image 27). Pancreas: Unremarkable. No pancreatic ductal dilatation or surrounding inflammatory changes. Spleen: Normal in size without significant abnormality. Adrenals/Urinary Tract: Adrenal glands are unremarkable. Simple, benign left renal cortical cysts, for which no further follow-up or characterization is required. Kidneys are otherwise normal, without obvious renal calculi, solid lesion, or hydronephrosis. Stomach/Bowel: Stomach is within normal limits.  No evidence of bowel wall thickening, distention, or inflammatory changes. Descending and sigmoid diverticulosis. Vascular/Lymphatic: No significant vascular findings are present. No enlarged abdominal lymph nodes. Other: No abdominal wall hernia or abnormality. No ascites. Musculoskeletal: No acute or significant osseous findings. IMPRESSION: 1. Cholelithiasis with mild gallbladder wall thickening and trace pericholecystic fluid. 2. Choledocholithiasis with intra and extrahepatic biliary ductal dilatation. At least three small gallstones in the common bile duct within the pancreatic head, including the largest at the ampulla measuring 0.7 cm. 3. Descending and sigmoid diverticulosis. Preliminary communication of findings by Dr. Scott to Dr. Suzanne, 10:30 PM 01/19/2023. Electronically Signed   By: Marolyn JONETTA Jaksch M.D.   On: 01/19/2023 23:00   CT ABDOMEN PELVIS WO CONTRAST Result Date: 01/19/2023 CLINICAL DATA:  Chills and shaking.  Abdominal/flank pain. EXAM: CT ABDOMEN AND PELVIS WITHOUT CONTRAST TECHNIQUE: Multidetector CT imaging of the abdomen and pelvis was performed following the standard protocol without IV contrast. RADIATION DOSE REDUCTION: This exam was performed according to the departmental dose-optimization program which includes automated exposure control, adjustment of the mA and/or kV according to patient size and/or use of iterative reconstruction technique. COMPARISON:  None Available. FINDINGS: Lower chest: Unremarkable Hepatobiliary: Calcifications and heterogeneous density in the gallbladder, most likely representing gallstones. Potential mild stranding around the gallbladder. Common bile duct about 1.3 cm in diameter, mildly abnormally dilated. No directly visualized choledocholithiasis although choledocholithiasis is often occult on CT. Pancreas: Unremarkable Spleen: Unremarkable Adrenals/Urinary Tract: Fullness of both adrenal glands without discrete mass. Fluid density lesions of the left  kidney favoring cysts, largest 1.6 cm in diameter. Symmetric bilateral perirenal stranding. No urinary tract calculi are identified. No urinary bladder wall thickening. No hydronephrosis or hydroureter. Stomach/Bowel: Transverse duodenal diverticulum without findings of inflammation. Normal appendix. Descending and sigmoid colon diverticulosis, no findings of active diverticulitis. No dilated bowel. There are some scattered small noninflamed diverticula of the terminal ileum. Vascular/Lymphatic: Mild aortoiliac atheromatous vascular calcification. Reproductive: Mild prostatomegaly. Other: No supplemental non-categorized findings. Musculoskeletal: Lucency and trabecular thickening throughout the T12 vertebral body and posterior elements, suspicious for Paget's disease of bone given the diffuse vertebral involvement. Suspected right foraminal impingement at T11-12 due to facet spurring. Congenitally short pedicles along with spondylosis and degenerative disc disease cause multilevel impingement in the lower lumbar spine. Degenerative arthropathy of both hips. IMPRESSION: 1. Cholelithiasis. Potential mild stranding around the gallbladder. Common bile duct about 1.3 cm in diameter, mildly abnormally dilated. No directly visualized choledocholithiasis although choledocholithiasis is often occult on CT. Correlate with bilirubin levels and other indicators in assessing for potential biliary obstruction or acute cholecystitis. 2. Descending and sigmoid colon diverticulosis, no findings of active diverticulitis. 3. Mild prostatomegaly. 4. 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 5. Right foraminal impingement at T11-12 due to facet spurring. 6. Congenitally short  pedicles along with spondylosis and degenerative disc disease cause multilevel impingement in the lower lumbar spine. 7. Degenerative arthropathy of both hips. 8. Mild aortoiliac atheromatous vascular calcification. Aortic Atherosclerosis  (ICD10-I70.0). Electronically Signed   By: Ryan Salvage M.D.   On: 01/19/2023 18:40   DG Chest 2 View Result Date: 01/19/2023 CLINICAL DATA:  Cough EXAM: CHEST - 2 VIEW COMPARISON:  Report 03/29/2001 FINDINGS: No acute airspace disease or effusion. Normal cardiac size. No pneumothorax. IMPRESSION: No active cardiopulmonary disease. Electronically Signed   By: Luke Bun M.D.   On: 01/19/2023 17:16    Assessment/Plan Present on Admission:  Acute choledocholithiasis with possible acute cholecystitis //  Elevated LFTs -N.p.o., IV fluid hydration -Pain meds as needed -GI consult placed -May eventually need surgical consult -Antiemetics as needed  HTN (hypertension) -Norvasc  resumed   Dyslipidemia -Statins on hold with increased LFTs   BPH -Flomax  continued.  [facility] 01/20/2023, 12:01 AM

## 2023-01-20 NOTE — Consult Note (Addendum)
 [facility] GI Inpatient Consult Note   Ladell Francis Boss, M.D.  Reason for Consult: fatigue, subjective fever, elevated liver enzymes, choledocholithiasis   Attending Requesting Consult: Harlene Bowl, D.O.  Outpatient Primary Physician: Krichna Sowles, M.D.  History of Present Illness: Blake Rivera is a 89 y.o. male who presented yesterday to the [facility] ED with generalized malaise, weakness and subjective fever. Initial temp was 100.2 per ED physician Ester Sharps.  Patient reportedly without abdominal pain, nausea or vomiting.  Markedly elevated liver associated enzymes correspond with cholestatic pattern consistent with choledocholithiasis.  Patient without elevated white count at admission but now elevated at 13.3. Still no abdominal pain at this time. CT revealed evidence of choledocholithiasis with 3 stones in the common bile duct in the area of the pancreatic head.  There was also evidence of cholelithiasis with gallbladder wall thickening and trace pericholecystic fluid suggesting potential cholecystitis.   Past Medical History:  Past Medical History:  Diagnosis Date   Diverticulitis of colon    ED (erectile dysfunction)    Hyperglycemia    Hypertension    Vitamin D  deficiency     Problem List: Patient Active Problem List   Diagnosis Date Noted   Choledocholithiasis with acute cholecystitis 01/20/2023   Prediabetes 12/23/2019   Class 1 obesity due to excess calories with serious comorbidity and body mass index (BMI) of 31.0 to 31.9 in adult 08/21/2019   Nocturia 08/21/2019   Rotator cuff tendinitis, left 02/08/2018   Traumatic complete tear of left rotator cuff 02/08/2018   Localized, primary osteoarthritis 12/25/2017   Lipoma of scalp 12/03/2017   Obesity (BMI 30.0-34.9) 12/03/2017   CKD (chronic kidney disease) stage 2, GFR 60-89 ml/min 12/03/2017   Dyslipidemia 11/25/2014   Abnormal ECG 07/16/2014   History of colitis 07/16/2014   ED (erectile  dysfunction) 07/16/2014   Anterior knee pain 07/16/2014   Arthralgia of shoulder 07/16/2014   Dyspnea 12/29/2013   HTN (hypertension) 12/29/2013    Past Surgical History: Past Surgical History:  Procedure Laterality Date   HERNIA REPAIR     ROTATOR CUFF REPAIR  03/17/2018    Allergies: Allergies  Allergen Reactions   Lisinopril     Angioedema   Omacor  [Omega-3-Acid Ethyl Esters (Fish)]     Other reaction(s): Rash   Zocor  [Simvastatin]     Muscle Pain    Home Medications: (Not in a hospital admission)  Home medication reconciliation was completed with the patient.   Scheduled Inpatient Medications:    amLODipine   10 mg Oral Daily   atorvastatin   10 mg Oral Daily   enoxaparin  (LOVENOX ) injection  40 mg Subcutaneous Q24H   tamsulosin   0.8 mg Oral Daily    Continuous Inpatient Infusions:    cefTRIAXone  (ROCEPHIN )  IV      PRN Inpatient Medications:  HYDROmorphone  (DILAUDID ) injection, melatonin  Family History: family history includes Aneurysm in his mother; Emphysema in his father; Healthy in his brother, brother, and sister; Heart disease in his brother, mother, and sister.   GI Family History: Negative  Social History:   reports that he quit smoking about 8 years ago. His smoking use included cigarettes. He started smoking about 33 years ago. He has a 6.3 pack-year smoking history. He has never used smokeless tobacco. He reports current alcohol use of about 1.0 standard drink of alcohol per week. He reports that he does not use drugs. The patient denies ETOH, tobacco, or drug use.    Review of Systems:  Review of Systems - Negative except that in history of present illness  Physical Examination: BP (!) 161/81 (BP Location: Right Arm)   Pulse 98   Temp 98.2 F (36.8 C) (Oral)   Resp 20   Ht 6' (1.829 m)   Wt 98 kg   SpO2 95%   BMI 29.29 kg/m  Physical Exam Vitals reviewed.  Constitutional:      General: He is not in acute distress.    Appearance:  Normal appearance. He is not ill-appearing, toxic-appearing or diaphoretic.  HENT:     Head: Normocephalic and atraumatic.     Nose: Nose normal.  Eyes:     General: No scleral icterus.    Extraocular Movements: Extraocular movements intact.     Conjunctiva/sclera: Conjunctivae normal.     Pupils: Pupils are equal, round, and reactive to light.  Cardiovascular:     Heart sounds: Normal heart sounds. No murmur heard.    No gallop.  Pulmonary:     Effort: Pulmonary effort is normal. No respiratory distress.     Breath sounds: Normal breath sounds. No wheezing or rales.  Abdominal:     General: Bowel sounds are normal. There is no distension.     Palpations: There is no mass.     Tenderness: There is no abdominal tenderness. There is no guarding or rebound.     Hernia: No hernia is present.  Musculoskeletal:        General: Normal range of motion.     Cervical back: Normal range of motion.  Skin:    General: Skin is warm and dry.     Capillary Refill: Capillary refill takes less than 2 seconds.     Coloration: Skin is not jaundiced.  Neurological:     General: No focal deficit present.     Mental Status: He is alert.  Psychiatric:        Mood and Affect: Mood normal.        Behavior: Behavior normal.        Thought Content: Thought content normal.        Judgment: Judgment normal.     Data: Lab Results  Component Value Date   WBC 13.3 (H) 01/20/2023   HGB 13.2 01/20/2023   HCT 40.9 01/20/2023   MCV 86.7 01/20/2023   PLT 180 01/20/2023   Recent Labs  Lab 01/19/23 1423 01/20/23 0515  HGB 14.8 13.2   Lab Results  Component Value Date   NA 141 01/20/2023   K 4.5 01/20/2023   CL 106 01/20/2023   CO2 27 01/20/2023   BUN 15 01/20/2023   CREATININE 1.27 (H) 01/20/2023   Lab Results  Component Value Date   ALT 201 (H) 01/20/2023   AST 143 (H) 01/20/2023   ALKPHOS 258 (H) 01/20/2023   BILITOT 2.2 (H) 01/20/2023   No results for input(s): APTT, INR, PTT in  the last 168 hours.    Latest Ref Rng & Units 01/20/2023    5:15 AM 01/19/2023    2:23 PM 01/26/2022    9:35 AM  CBC  WBC 4.0 - 10.5 K/uL 13.3  7.4  4.5   Hemoglobin 13.0 - 17.0 g/dL 86.7  85.1  85.3   Hematocrit 39.0 - 52.0 % 40.9  47.3  45.0   Platelets 150 - 400 K/uL 180  180  166     STUDIES: MR ABDOMEN MRCP W WO CONTAST Result Date: 01/19/2023 CLINICAL DATA:  Right upper quadrant abdominal pain EXAM: MRI  ABDOMEN WITHOUT AND WITH CONTRAST (INCLUDING MRCP) TECHNIQUE: Multiplanar multisequence MR imaging of the abdomen was performed both before and after the administration of intravenous contrast. Heavily T2-weighted images of the biliary and pancreatic ducts were obtained, and three-dimensional MRCP images were rendered by post processing. CONTRAST:  10mL GADAVIST  GADOBUTROL  1 MMOL/ML IV SOLN COMPARISON:  CT abdomen pelvis, 01/19/2023 FINDINGS: Lower chest: No acute abnormality. Hepatobiliary: No solid liver abnormality is seen. Numerous small gallstones contracted in the gallbladder. Mild gallbladder wall thickening and trace pericholecystic fluid. Intra and extrahepatic biliary ductal dilatation, the common bile duct measuring up to 1.2 cm in caliber. At least three small gallstones in the common bile duct within the pancreatic head, including the largest at the ampulla measuring 0.7 cm (series 3, image 17, series 8, image 27). Pancreas: Unremarkable. No pancreatic ductal dilatation or surrounding inflammatory changes. Spleen: Normal in size without significant abnormality. Adrenals/Urinary Tract: Adrenal glands are unremarkable. Simple, benign left renal cortical cysts, for which no further follow-up or characterization is required. Kidneys are otherwise normal, without obvious renal calculi, solid lesion, or hydronephrosis. Stomach/Bowel: Stomach is within normal limits. No evidence of bowel wall thickening, distention, or inflammatory changes. Descending and sigmoid diverticulosis.  Vascular/Lymphatic: No significant vascular findings are present. No enlarged abdominal lymph nodes. Other: No abdominal wall hernia or abnormality. No ascites. Musculoskeletal: No acute or significant osseous findings. IMPRESSION: 1. Cholelithiasis with mild gallbladder wall thickening and trace pericholecystic fluid. 2. Choledocholithiasis with intra and extrahepatic biliary ductal dilatation. At least three small gallstones in the common bile duct within the pancreatic head, including the largest at the ampulla measuring 0.7 cm. 3. Descending and sigmoid diverticulosis. Preliminary communication of findings by Dr. Scott to Dr. Suzanne, 10:30 PM 01/19/2023. Electronically Signed   By: Marolyn JONETTA Jaksch M.D.   On: 01/19/2023 23:00   MR 3D Recon At Scanner Result Date: 01/19/2023 CLINICAL DATA:  Right upper quadrant abdominal pain EXAM: MRI ABDOMEN WITHOUT AND WITH CONTRAST (INCLUDING MRCP) TECHNIQUE: Multiplanar multisequence MR imaging of the abdomen was performed both before and after the administration of intravenous contrast. Heavily T2-weighted images of the biliary and pancreatic ducts were obtained, and three-dimensional MRCP images were rendered by post processing. CONTRAST:  10mL GADAVIST  GADOBUTROL  1 MMOL/ML IV SOLN COMPARISON:  CT abdomen pelvis, 01/19/2023 FINDINGS: Lower chest: No acute abnormality. Hepatobiliary: No solid liver abnormality is seen. Numerous small gallstones contracted in the gallbladder. Mild gallbladder wall thickening and trace pericholecystic fluid. Intra and extrahepatic biliary ductal dilatation, the common bile duct measuring up to 1.2 cm in caliber. At least three small gallstones in the common bile duct within the pancreatic head, including the largest at the ampulla measuring 0.7 cm (series 3, image 17, series 8, image 27). Pancreas: Unremarkable. No pancreatic ductal dilatation or surrounding inflammatory changes. Spleen: Normal in size without significant abnormality.  Adrenals/Urinary Tract: Adrenal glands are unremarkable. Simple, benign left renal cortical cysts, for which no further follow-up or characterization is required. Kidneys are otherwise normal, without obvious renal calculi, solid lesion, or hydronephrosis. Stomach/Bowel: Stomach is within normal limits. No evidence of bowel wall thickening, distention, or inflammatory changes. Descending and sigmoid diverticulosis. Vascular/Lymphatic: No significant vascular findings are present. No enlarged abdominal lymph nodes. Other: No abdominal wall hernia or abnormality. No ascites. Musculoskeletal: No acute or significant osseous findings. IMPRESSION: 1. Cholelithiasis with mild gallbladder wall thickening and trace pericholecystic fluid. 2. Choledocholithiasis with intra and extrahepatic biliary ductal dilatation. At least three small gallstones in the common bile duct within  the pancreatic head, including the largest at the ampulla measuring 0.7 cm. 3. Descending and sigmoid diverticulosis. Preliminary communication of findings by Dr. Scott to Dr. Suzanne, 10:30 PM 01/19/2023. Electronically Signed   By: Marolyn JONETTA Jaksch M.D.   On: 01/19/2023 23:00   CT ABDOMEN PELVIS WO CONTRAST Result Date: 01/19/2023 CLINICAL DATA:  Chills and shaking.  Abdominal/flank pain. EXAM: CT ABDOMEN AND PELVIS WITHOUT CONTRAST TECHNIQUE: Multidetector CT imaging of the abdomen and pelvis was performed following the standard protocol without IV contrast. RADIATION DOSE REDUCTION: This exam was performed according to the departmental dose-optimization program which includes automated exposure control, adjustment of the mA and/or kV according to patient size and/or use of iterative reconstruction technique. COMPARISON:  None Available. FINDINGS: Lower chest: Unremarkable Hepatobiliary: Calcifications and heterogeneous density in the gallbladder, most likely representing gallstones. Potential mild stranding around the gallbladder. Common bile duct  about 1.3 cm in diameter, mildly abnormally dilated. No directly visualized choledocholithiasis although choledocholithiasis is often occult on CT. Pancreas: Unremarkable Spleen: Unremarkable Adrenals/Urinary Tract: Fullness of both adrenal glands without discrete mass. Fluid density lesions of the left kidney favoring cysts, largest 1.6 cm in diameter. Symmetric bilateral perirenal stranding. No urinary tract calculi are identified. No urinary bladder wall thickening. No hydronephrosis or hydroureter. Stomach/Bowel: Transverse duodenal diverticulum without findings of inflammation. Normal appendix. Descending and sigmoid colon diverticulosis, no findings of active diverticulitis. No dilated bowel. There are some scattered small noninflamed diverticula of the terminal ileum. Vascular/Lymphatic: Mild aortoiliac atheromatous vascular calcification. Reproductive: Mild prostatomegaly. Other: No supplemental non-categorized findings. Musculoskeletal: Lucency and trabecular thickening throughout the T12 vertebral body and posterior elements, suspicious for Paget's disease of bone given the diffuse vertebral involvement. Suspected right foraminal impingement at T11-12 due to facet spurring. Congenitally short pedicles along with spondylosis and degenerative disc disease cause multilevel impingement in the lower lumbar spine. Degenerative arthropathy of both hips. IMPRESSION: 1. Cholelithiasis. Potential mild stranding around the gallbladder. Common bile duct about 1.3 cm in diameter, mildly abnormally dilated. No directly visualized choledocholithiasis although choledocholithiasis is often occult on CT. Correlate with bilirubin levels and other indicators in assessing for potential biliary obstruction or acute cholecystitis. 2. Descending and sigmoid colon diverticulosis, no findings of active diverticulitis. 3. Mild prostatomegaly. 4. 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 5. Right foraminal  impingement at T11-12 due to facet spurring. 6. Congenitally short pedicles along with spondylosis and degenerative disc disease cause multilevel impingement in the lower lumbar spine. 7. Degenerative arthropathy of both hips. 8. Mild aortoiliac atheromatous vascular calcification. Aortic Atherosclerosis (ICD10-I70.0). Electronically Signed   By: Ryan Salvage M.D.   On: 01/19/2023 18:40   DG Chest 2 View Result Date: 01/19/2023 CLINICAL DATA:  Cough EXAM: CHEST - 2 VIEW COMPARISON:  Report 03/29/2001 FINDINGS: No acute airspace disease or effusion. Normal cardiac size. No pneumothorax. IMPRESSION: No active cardiopulmonary disease. Electronically Signed   By: Luke Scott M.D.   On: 01/19/2023 17:16   @IMAGES @  Assessment:  1.  Choledocholithiasis-currently asymptomatic except for mild weakness, chills and subjective fever.  Temperature currently 98.3. 2.  Elevated liver associated enzymes-cholestatic pattern correlating with evidence of choledocholithiasis. 3.  Cholelithiasis with possible early cholecystitis.  Abdomen nontender. 4.  Leukocytosis-secondary to biliary pathology. 5.  Advanced age 89.  CKD stage II    Recommendations: 1.  Clear liquid diet.  May advance as tolerated up until time of ERCP. 2.  ERCP planned within 48 hours.  Case discussed with Dr. York will be available to  perform procedure on Monday.  Consider alternative more urgent plans if any decline in patient clinical status.  3.  Continue IV antibiotics 4.  Check daily liver enzymes, CBC. 5.  Surgical consult to weigh in on potential cholecystectomy vs. Cholecystostomy for worsening status. 6.  Will continue to follow closely.  Thank you for the consult. Please call with questions or concerns.  Aundria Ladell Eck MD [facility] [address] [place], [place] [address] [phone]  01/20/2023 8:27 AM

## 2023-01-20 NOTE — Progress Notes (Signed)
 Pharmacy Antibiotic Note  Blake Rivera is a 89 y.o. male admitted on 01/19/2023 with  choledocholithiasis .  Pharmacy has been consulted for Zosyn  dosing.  Plan: Zosyn  3.375g IV q8h (4 hour infusion).  Height: 6' (182.9 cm) Weight: 98 kg (216 lb) IBW/kg (Calculated) : 77.6  Temp (24hrs), Avg:100 F (37.8 C), Min:99.8 F (37.7 C), Max:100.2 F (37.9 C)  Recent Labs  Lab 01/19/23 1423 01/19/23 1626  WBC 7.4  --   CREATININE 1.07  --   LATICACIDVEN 2.6* 1.6    Estimated Creatinine Clearance: 60.1 mL/min (by C-G formula based on SCr of 1.07 mg/dL).    Allergies  Allergen Reactions   Lisinopril     Angioedema   Omacor  [Omega-3-Acid Ethyl Esters (Fish)]     Other reaction(s): Rash   Zocor  [Simvastatin]     Muscle Pain    Antimicrobials this admission:   >>    >>   Dose adjustments this admission:   Microbiology results:  BCx:   UCx:    Sputum:    MRSA PCR:   Thank you for allowing pharmacy to be a part of this patient's care.  Tameka Hoiland D 01/20/2023 12:36 AM

## 2023-01-20 NOTE — H&P (View-Only) (Signed)
 Subjective:   CC: choledocolithiasis  HPI:  Blake Rivera is a 89 y.o. male who is consulted by [facility] for evaluation of above cc.  Symptoms were first noted a few days ago. Minimal pain but malaise. Imaging as below.    Past Medical History:  has a past medical history of Diverticulitis of colon, ED (erectile dysfunction), Hyperglycemia, Hypertension, and Vitamin D  deficiency.  Past Surgical History:  has a past surgical history that includes Hernia repair and Rotator cuff repair (03/17/2018).  Family History: family history includes Aneurysm in his mother; Emphysema in his father; Healthy in his brother, brother, and sister; Heart disease in his brother, mother, and sister.  Social History:  reports that he quit smoking about 8 years ago. His smoking use included cigarettes. He started smoking about 33 years ago. He has a 6.3 pack-year smoking history. He has never used smokeless tobacco. He reports current alcohol use of about 1.0 standard drink of alcohol per week. He reports that he does not use drugs.  Current Medications:  Prior to Admission medications   Medication Sig Start Date End Date Taking? Authorizing Provider  amLODipine  (NORVASC ) 10 MG tablet Take 1 tablet (10 mg total) by mouth daily. 01/26/22  Yes [name], DO  atorvastatin  (LIPITOR) 10 MG tablet Take 1 tablet (10 mg total) by mouth daily. 01/26/22  Yes [name], DO  doxycycline  (ADOXA) 100 MG tablet Take 100 mg by mouth 2 (two) times daily. 7 day course.   Yes Sowles, Krichna, MD  metFORMIN  (GLUCOPHAGE ) 500 MG tablet Take 500 mg by mouth daily with breakfast. 08/19/22  Yes [provider]  tamsulosin  (FLOMAX ) 0.4 MG CAPS capsule Take 2 capsules (0.8 mg total) by mouth daily. 01/26/22  Yes [name], DO    Allergies:  Allergies as of 01/19/2023 - Review Complete 01/19/2023  Allergen Reaction Noted   Lisinopril  12/22/2013   Omacor  [omega-3-acid ethyl esters (fish)]  12/22/2013    Zocor  [simvastatin]  12/22/2013    ROS:  General: Denies weight loss, weight gain,  and night sweats. Eyes: Denies blurry vision, double vision, eye pain, itchy eyes, and tearing. Ears: Denies hearing loss, earache, and ringing in ears. Nose: Denies sinus pain, congestion, infections, runny nose, and nosebleeds. Mouth/throat: Denies hoarseness, sore throat, bleeding gums, and difficulty swallowing. Heart: Denies chest pain, palpitations, racing heart, irregular heartbeat, leg pain or swelling, and decreased activity tolerance. Respiratory: Denies breathing difficulty, shortness of breath, wheezing, cough, and sputum. GI: Denies change in appetite, heartburn, nausea, vomiting, constipation, diarrhea, and blood in stool. GU: Denies difficulty urinating, pain with urinating, urgency, frequency, blood in urine. Musculoskeletal: Denies joint stiffness, pain, swelling, muscle weakness. Skin: Denies rash, itching, mass, tumors, sores, and boils Neurologic: Denies headache, fainting, dizziness, seizures, numbness, and tingling. Psychiatric: Denies depression, anxiety, difficulty sleeping, and memory loss. Endocrine: Denies heat or cold intolerance, and increased thirst or urination. Blood/lymph: Denies easy bruising, and swollen glands     Objective:     BP (!) 149/59 (BP Location: Right Arm)   Pulse 60   Temp 98.3 F (36.8 C) (Oral)   Resp 18   Ht 6' (1.829 m)   Wt 98 kg   SpO2 96%   BMI 29.29 kg/m    Constitutional :  alert, cooperative, appears stated age, and no distress  Lymphatics/Throat:  no asymmetry, masses, or scars  Respiratory:  clear to auscultation bilaterally  Cardiovascular:  regular rate and rhythm  Gastrointestinal: soft, non-tender; bowel sounds normal;  no masses,  no organomegaly.   Musculoskeletal: Steady movement  Skin: Cool and moist  Psychiatric: Normal affect, non-agitated, not confused       LABS:     Latest Ref Rng & Units 01/20/2023    5:15 AM  01/19/2023    4:47 PM 01/19/2023    2:23 PM  CMP  Glucose 70 - 99 mg/dL 88   875   BUN 8 - 23 mg/dL 15   13   Creatinine 9.38 - 1.24 mg/dL 8.72   8.92   Sodium 864 - 145 mmol/L 141   137   Potassium 3.5 - 5.1 mmol/L 4.5   4.1   Chloride 98 - 111 mmol/L 106   100   CO2 22 - 32 mmol/L 27   24   Calcium  8.9 - 10.3 mg/dL 9.0   9.2   Total Protein 6.5 - 8.1 g/dL 6.9  8.0    Total Bilirubin 0.0 - 1.2 mg/dL 2.2  2.2    Alkaline Phos 38 - 126 U/L 258  311    AST 15 - 41 U/L 143  272    ALT 0 - 44 U/L 201  242        Latest Ref Rng & Units 01/20/2023    5:15 AM 01/19/2023    2:23 PM 01/26/2022    9:35 AM  CBC  WBC 4.0 - 10.5 K/uL 13.3  7.4  4.5   Hemoglobin 13.0 - 17.0 g/dL 86.7  85.1  85.3   Hematocrit 39.0 - 52.0 % 40.9  47.3  45.0   Platelets 150 - 400 K/uL 180  180  166      RADS: CLINICAL DATA:  Right upper quadrant abdominal pain   EXAM: MRI ABDOMEN WITHOUT AND WITH CONTRAST (INCLUDING MRCP)   TECHNIQUE: Multiplanar multisequence MR imaging of the abdomen was performed both before and after the administration of intravenous contrast. Heavily T2-weighted images of the biliary and pancreatic ducts were obtained, and three-dimensional MRCP images were rendered by post processing.   CONTRAST:  10mL GADAVIST  GADOBUTROL  1 MMOL/ML IV SOLN   COMPARISON:  CT abdomen pelvis, 01/19/2023   FINDINGS: Lower chest: No acute abnormality.   Hepatobiliary: No solid liver abnormality is seen. Numerous small gallstones contracted in the gallbladder. Mild gallbladder wall thickening and trace pericholecystic fluid. Intra and extrahepatic biliary ductal dilatation, the common bile duct measuring up to 1.2 cm in caliber. At least three small gallstones in the common bile duct within the pancreatic head, including the largest at the ampulla measuring 0.7 cm (series 3, image 17, series 8, image 27).   Pancreas: Unremarkable. No pancreatic ductal dilatation or surrounding inflammatory  changes.   Spleen: Normal in size without significant abnormality.   Adrenals/Urinary Tract: Adrenal glands are unremarkable. Simple, benign left renal cortical cysts, for which no further follow-up or characterization is required. Kidneys are otherwise normal, without obvious renal calculi, solid lesion, or hydronephrosis.   Stomach/Bowel: Stomach is within normal limits. No evidence of bowel wall thickening, distention, or inflammatory changes. Descending and sigmoid diverticulosis.   Vascular/Lymphatic: No significant vascular findings are present. No enlarged abdominal lymph nodes.   Other: No abdominal wall hernia or abnormality. No ascites.   Musculoskeletal: No acute or significant osseous findings.   IMPRESSION: 1. Cholelithiasis with mild gallbladder wall thickening and trace pericholecystic fluid. 2. Choledocholithiasis with intra and extrahepatic biliary ductal dilatation. At least three small gallstones in the common bile duct within the pancreatic head, including the largest  at the ampulla measuring 0.7 cm. 3. Descending and sigmoid diverticulosis.   Preliminary communication of findings by Dr. Scott to Dr. Suzanne, 10:30 PM 01/19/2023.     Electronically Signed   By: Marolyn JONETTA Jaksch M.D.   On: 01/19/2023 23:00 Assessment:      Choledocolithiasis- recommend robo lap chole after ERCP to prevent recurrence  Plan:      Discussed the risk of surgery including post-op infxn, seroma, biloma, chronic pain, poor-delayed wound healing, retained gallstone, conversion to open procedure, post-op SBO or ileus, and need for additional procedures to address said risks.  The risks of general anesthetic including MI, CVA, sudden death or even reaction to anesthetic medications also discussed. Alternatives include continued observation.  Benefits include possible symptom relief, prevention of complications including acute cholecystitis, pancreatitis.  Typical post operative  recovery of 3-5 days rest, continued pain in area and incision sites, possible loose stools up to 4-6 weeks, also discussed.  The patient understands the risks, any and all questions were answered to the patient's satisfaction.  Pt will like to think it over due to concerns with anesthesia and also undergoing major surgery at his age.  Will be available for additional questions or concerns.  Earliest time to proceed will be Tuesday, after the ERCP.  Continue current care per primary team  labs/images/medications/previous chart entries reviewed personally and relevant changes/updates noted above.

## 2023-01-21 DIAGNOSIS — K8042 Calculus of bile duct with acute cholecystitis without obstruction: Secondary | ICD-10-CM | POA: Diagnosis not present

## 2023-01-21 LAB — CBC
HCT: 38.9 % — ABNORMAL LOW (ref 39.0–52.0)
Hemoglobin: 12.8 g/dL — ABNORMAL LOW (ref 13.0–17.0)
MCH: 28.2 pg (ref 26.0–34.0)
MCHC: 32.9 g/dL (ref 30.0–36.0)
MCV: 85.7 fL (ref 80.0–100.0)
Platelets: 187 10*3/uL (ref 150–400)
RBC: 4.54 MIL/uL (ref 4.22–5.81)
RDW: 14.8 % (ref 11.5–15.5)
WBC: 9.1 10*3/uL (ref 4.0–10.5)
nRBC: 0 % (ref 0.0–0.2)

## 2023-01-21 LAB — COMPREHENSIVE METABOLIC PANEL
ALT: 129 U/L — ABNORMAL HIGH (ref 0–44)
AST: 59 U/L — ABNORMAL HIGH (ref 15–41)
Albumin: 3.4 g/dL — ABNORMAL LOW (ref 3.5–5.0)
Alkaline Phosphatase: 222 U/L — ABNORMAL HIGH (ref 38–126)
Anion gap: 9 (ref 5–15)
BUN: 15 mg/dL (ref 8–23)
CO2: 25 mmol/L (ref 22–32)
Calcium: 8.8 mg/dL — ABNORMAL LOW (ref 8.9–10.3)
Chloride: 104 mmol/L (ref 98–111)
Creatinine, Ser: 1.05 mg/dL (ref 0.61–1.24)
GFR, Estimated: >60 mL/min (ref >60)
Glucose, Bld: 91 mg/dL (ref 70–99)
Potassium: 3.7 mmol/L (ref 3.5–5.1)
Sodium: 138 mmol/L (ref 135–145)
Total Bilirubin: 1.1 mg/dL (ref 0.0–1.2)
Total Protein: 6.6 g/dL (ref 6.5–8.1)

## 2023-01-21 MED ORDER — SODIUM CHLORIDE 0.9% FLUSH
3.0000 mL | Freq: Two times a day (BID) | INTRAVENOUS | Status: DC
  Administered 2023-01-21: 10 mL via INTRAVENOUS
  Administered 2023-01-22: 3 mL via INTRAVENOUS

## 2023-01-21 MED ORDER — SODIUM CHLORIDE 0.9 % IV SOLN: 250.0000 mL | INTRAVENOUS | Status: DC

## 2023-01-21 MED ORDER — SODIUM CHLORIDE 0.9% FLUSH: 3.0000 mL | INTRAVENOUS | Status: DC | PRN

## 2023-01-21 NOTE — Progress Notes (Signed)
 [facility] Gastroenterology Inpatient Progress Note    Subjective: Patient seen for follow up of choledocholithiasis, elevated liver enzymes, possible early cholecystitis. Appreciate surgical consultation from Dr. Tye.   Objective: Vital signs in last 24 hours: Temp:  [98.2 F (36.8 C)-99 F (37.2 C)] 98.2 F (36.8 C) (01/05 0845) Pulse Rate:  [51-98] 51 (01/05 0845) Resp:  [17-20] 20 (01/05 0418) BP: (131-148)/(69-80) 134/74 (01/05 0845) SpO2:  [89 %-97 %] 92 % (01/05 0845) Blood pressure 134/74, pulse (!) 51, temperature 98.2 F (36.8 C), temperature source Oral, resp. rate 20, height 6' (1.829 m), weight 98 kg, SpO2 92%.    Intake/Output from previous day: No intake/output data recorded.  Intake/Output this shift: No intake/output data recorded.   Gen: NAD. Appears comfortable.  HEENT: [place]/AT. PERRLA. Normal external ear exam.  Chest: CTA, no wheezes.  CV: RR nl S1, S2. No gallops.  Abd: soft, nt, nd. BS+  Ext: no edema. Pulses 2+  Neuro: Alert and oriented. Judgement appears normal. Nonfocal.   Lab Results: Results for orders placed or performed during the hospital encounter of 01/19/23 (from the past 24 hours)  Comprehensive metabolic panel     Status: Abnormal   Collection Time: 01/21/23  6:28 AM  Result Value Ref Range   Sodium 138 135 - 145 mmol/L   Potassium 3.7 3.5 - 5.1 mmol/L   Chloride 104 98 - 111 mmol/L   CO2 25 22 - 32 mmol/L   Glucose, Bld 91 70 - 99 mg/dL   BUN 15 8 - 23 mg/dL   Creatinine, Ser 8.94 0.61 - 1.24 mg/dL   Calcium  8.8 (L) 8.9 - 10.3 mg/dL   Total Protein 6.6 6.5 - 8.1 g/dL   Albumin 3.4 (L) 3.5 - 5.0 g/dL   AST 59 (H) 15 - 41 U/L   ALT 129 (H) 0 - 44 U/L   Alkaline Phosphatase 222 (H) 38 - 126 U/L   Total Bilirubin 1.1 0.0 - 1.2 mg/dL   GFR, Estimated >39 >39 mL/min   Anion gap 9 5 - 15  CBC     Status: Abnormal   Collection Time: 01/21/23  6:28 AM  Result Value Ref Range   WBC 9.1 4.0 - 10.5 K/uL   RBC 4.54 4.22  - 5.81 MIL/uL   Hemoglobin 12.8 (L) 13.0 - 17.0 g/dL   HCT 61.0 (L) 60.9 - 47.9 %   MCV 85.7 80.0 - 100.0 fL   MCH 28.2 26.0 - 34.0 pg   MCHC 32.9 30.0 - 36.0 g/dL   RDW 85.1 88.4 - 84.4 %   Platelets 187 150 - 400 K/uL   nRBC 0.0 0.0 - 0.2 %     Recent Labs    01/19/23 1423 01/20/23 0515 01/21/23 0628  WBC 7.4 13.3* 9.1  HGB 14.8 13.2 12.8*  HCT 47.3 40.9 38.9*  PLT 180 180 187   BMET Recent Labs    01/19/23 1423 01/20/23 0515 01/21/23 0628  NA 137 141 138  K 4.1 4.5 3.7  CL 100 106 104  CO2 24 27 25   GLUCOSE 124* 88 91  BUN 13 15 15   CREATININE 1.07 1.27* 1.05  CALCIUM  9.2 9.0 8.8*   LFT Recent Labs    01/19/23 1647 01/20/23 0515 01/21/23 0628  PROT 8.0   < > 6.6  ALBUMIN 4.2   < > 3.4*  AST 272*   < > 59*  ALT 242*   < > 129*  ALKPHOS 311*   < >  222*  BILITOT 2.2*   < > 1.1  BILIDIR 1.3*  --   --   IBILI 0.9  --   --    < > = values in this interval not displayed.   PT/INR No results for input(s): LABPROT, INR in the last 72 hours. Hepatitis Panel No results for input(s): HEPBSAG, HCVAB, HEPAIGM, HEPBIGM in the last 72 hours. C-Diff No results for input(s): CDIFFTOX in the last 72 hours. No results for input(s): CDIFFPCR in the last 72 hours.   Studies/Results: MR ABDOMEN MRCP W WO CONTAST Result Date: 01/19/2023 CLINICAL DATA:  Right upper quadrant abdominal pain EXAM: MRI ABDOMEN WITHOUT AND WITH CONTRAST (INCLUDING MRCP) TECHNIQUE: Multiplanar multisequence MR imaging of the abdomen was performed both before and after the administration of intravenous contrast. Heavily T2-weighted images of the biliary and pancreatic ducts were obtained, and three-dimensional MRCP images were rendered by post processing. CONTRAST:  10mL GADAVIST  GADOBUTROL  1 MMOL/ML IV SOLN COMPARISON:  CT abdomen pelvis, 01/19/2023 FINDINGS: Lower chest: No acute abnormality. Hepatobiliary: No solid liver abnormality is seen. Numerous small gallstones contracted in  the gallbladder. Mild gallbladder wall thickening and trace pericholecystic fluid. Intra and extrahepatic biliary ductal dilatation, the common bile duct measuring up to 1.2 cm in caliber. At least three small gallstones in the common bile duct within the pancreatic head, including the largest at the ampulla measuring 0.7 cm (series 3, image 17, series 8, image 27). Pancreas: Unremarkable. No pancreatic ductal dilatation or surrounding inflammatory changes. Spleen: Normal in size without significant abnormality. Adrenals/Urinary Tract: Adrenal glands are unremarkable. Simple, benign left renal cortical cysts, for which no further follow-up or characterization is required. Kidneys are otherwise normal, without obvious renal calculi, solid lesion, or hydronephrosis. Stomach/Bowel: Stomach is within normal limits. No evidence of bowel wall thickening, distention, or inflammatory changes. Descending and sigmoid diverticulosis. Vascular/Lymphatic: No significant vascular findings are present. No enlarged abdominal lymph nodes. Other: No abdominal wall hernia or abnormality. No ascites. Musculoskeletal: No acute or significant osseous findings. IMPRESSION: 1. Cholelithiasis with mild gallbladder wall thickening and trace pericholecystic fluid. 2. Choledocholithiasis with intra and extrahepatic biliary ductal dilatation. At least three small gallstones in the common bile duct within the pancreatic head, including the largest at the ampulla measuring 0.7 cm. 3. Descending and sigmoid diverticulosis. Preliminary communication of findings by Dr. Scott to Dr. Suzanne, 10:30 PM 01/19/2023. Electronically Signed   By: Marolyn JONETTA Jaksch M.D.   On: 01/19/2023 23:00   MR 3D Recon At Scanner Result Date: 01/19/2023 CLINICAL DATA:  Right upper quadrant abdominal pain EXAM: MRI ABDOMEN WITHOUT AND WITH CONTRAST (INCLUDING MRCP) TECHNIQUE: Multiplanar multisequence MR imaging of the abdomen was performed both before and after the  administration of intravenous contrast. Heavily T2-weighted images of the biliary and pancreatic ducts were obtained, and three-dimensional MRCP images were rendered by post processing. CONTRAST:  10mL GADAVIST  GADOBUTROL  1 MMOL/ML IV SOLN COMPARISON:  CT abdomen pelvis, 01/19/2023 FINDINGS: Lower chest: No acute abnormality. Hepatobiliary: No solid liver abnormality is seen. Numerous small gallstones contracted in the gallbladder. Mild gallbladder wall thickening and trace pericholecystic fluid. Intra and extrahepatic biliary ductal dilatation, the common bile duct measuring up to 1.2 cm in caliber. At least three small gallstones in the common bile duct within the pancreatic head, including the largest at the ampulla measuring 0.7 cm (series 3, image 17, series 8, image 27). Pancreas: Unremarkable. No pancreatic ductal dilatation or surrounding inflammatory changes. Spleen: Normal in size without significant abnormality. Adrenals/Urinary Tract: Adrenal  glands are unremarkable. Simple, benign left renal cortical cysts, for which no further follow-up or characterization is required. Kidneys are otherwise normal, without obvious renal calculi, solid lesion, or hydronephrosis. Stomach/Bowel: Stomach is within normal limits. No evidence of bowel wall thickening, distention, or inflammatory changes. Descending and sigmoid diverticulosis. Vascular/Lymphatic: No significant vascular findings are present. No enlarged abdominal lymph nodes. Other: No abdominal wall hernia or abnormality. No ascites. Musculoskeletal: No acute or significant osseous findings. IMPRESSION: 1. Cholelithiasis with mild gallbladder wall thickening and trace pericholecystic fluid. 2. Choledocholithiasis with intra and extrahepatic biliary ductal dilatation. At least three small gallstones in the common bile duct within the pancreatic head, including the largest at the ampulla measuring 0.7 cm. 3. Descending and sigmoid diverticulosis. Preliminary  communication of findings by Dr. Scott to Dr. Suzanne, 10:30 PM 01/19/2023. Electronically Signed   By: Marolyn JONETTA Jaksch M.D.   On: 01/19/2023 23:00   CT ABDOMEN PELVIS WO CONTRAST Result Date: 01/19/2023 CLINICAL DATA:  Chills and shaking.  Abdominal/flank pain. EXAM: CT ABDOMEN AND PELVIS WITHOUT CONTRAST TECHNIQUE: Multidetector CT imaging of the abdomen and pelvis was performed following the standard protocol without IV contrast. RADIATION DOSE REDUCTION: This exam was performed according to the departmental dose-optimization program which includes automated exposure control, adjustment of the mA and/or kV according to patient size and/or use of iterative reconstruction technique. COMPARISON:  None Available. FINDINGS: Lower chest: Unremarkable Hepatobiliary: Calcifications and heterogeneous density in the gallbladder, most likely representing gallstones. Potential mild stranding around the gallbladder. Common bile duct about 1.3 cm in diameter, mildly abnormally dilated. No directly visualized choledocholithiasis although choledocholithiasis is often occult on CT. Pancreas: Unremarkable Spleen: Unremarkable Adrenals/Urinary Tract: Fullness of both adrenal glands without discrete mass. Fluid density lesions of the left kidney favoring cysts, largest 1.6 cm in diameter. Symmetric bilateral perirenal stranding. No urinary tract calculi are identified. No urinary bladder wall thickening. No hydronephrosis or hydroureter. Stomach/Bowel: Transverse duodenal diverticulum without findings of inflammation. Normal appendix. Descending and sigmoid colon diverticulosis, no findings of active diverticulitis. No dilated bowel. There are some scattered small noninflamed diverticula of the terminal ileum. Vascular/Lymphatic: Mild aortoiliac atheromatous vascular calcification. Reproductive: Mild prostatomegaly. Other: No supplemental non-categorized findings. Musculoskeletal: Lucency and trabecular thickening throughout the  T12 vertebral body and posterior elements, suspicious for Paget's disease of bone given the diffuse vertebral involvement. Suspected right foraminal impingement at T11-12 due to facet spurring. Congenitally short pedicles along with spondylosis and degenerative disc disease cause multilevel impingement in the lower lumbar spine. Degenerative arthropathy of both hips. IMPRESSION: 1. Cholelithiasis. Potential mild stranding around the gallbladder. Common bile duct about 1.3 cm in diameter, mildly abnormally dilated. No directly visualized choledocholithiasis although choledocholithiasis is often occult on CT. Correlate with bilirubin levels and other indicators in assessing for potential biliary obstruction or acute cholecystitis. 2. Descending and sigmoid colon diverticulosis, no findings of active diverticulitis. 3. Mild prostatomegaly. 4. 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 5. Right foraminal impingement at T11-12 due to facet spurring. 6. Congenitally short pedicles along with spondylosis and degenerative disc disease cause multilevel impingement in the lower lumbar spine. 7. Degenerative arthropathy of both hips. 8. Mild aortoiliac atheromatous vascular calcification. Aortic Atherosclerosis (ICD10-I70.0). Electronically Signed   By: Ryan Salvage M.D.   On: 01/19/2023 18:40   DG Chest 2 View Result Date: 01/19/2023 CLINICAL DATA:  Cough EXAM: CHEST - 2 VIEW COMPARISON:  Report 03/29/2001 FINDINGS: No acute airspace disease or effusion. Normal cardiac size. No pneumothorax. IMPRESSION: No active cardiopulmonary  disease. Electronically Signed   By: Luke Bun M.D.   On: 01/19/2023 17:16    Scheduled Inpatient Medications:    amLODipine   10 mg Oral Daily   atorvastatin   10 mg Oral Daily   enoxaparin  (LOVENOX ) injection  40 mg Subcutaneous Q24H   tamsulosin   0.8 mg Oral Daily    Continuous Inpatient Infusions:    cefTRIAXone  (ROCEPHIN )  IV 2 g (01/21/23 0951)    PRN  Inpatient Medications:  HYDROmorphone  (DILAUDID ) injection, melatonin  Miscellaneous: N/A  Assessment:  Choledocholithiasis. Elevated LAE's.  Otherwise asymptomatic. Leukocytosis.  Plan:  ERCP in AM with Dr. Jinny.  Tilla Wilborn K. Aundria, M.D. 01/21/2023, 12:15 PM

## 2023-01-21 NOTE — Progress Notes (Signed)
 PROGRESS NOTE    Blake Rivera  FMW:[id number] DOB: 1934-12-13 DOA: 01/19/2023 PCP: [name], DO    Brief Narrative:  89 year old male with past medical history significant for CKD stage II, HLD, HTN.  Today he had severe chills that would not resolve.  He just could not get warm.  He denies fever, nausea, vomiting or diarrhea.  He reports some mild abdominal discomfort starting around Thanksgiving but nothing significant.  Today he has no abdominal discomfort. Found to have bacteremia and choledocholithiasis +/- cholecystitis.     Assessment and Plan: Acute choledocholithiasis with possible acute cholecystitis //  Elevated LFTs -N.p.o., IV fluid hydration -Pain meds as needed -GI consulted -Antiemetics as needed Blood cultures positive for Kleb pna-- IV Abx   HTN (hypertension) -Norvasc  resumed    Dyslipidemia -Statins on hold with increased LFTs    BPH -Flomax  continued.        DVT prophylaxis: enoxaparin  (LOVENOX ) injection 40 mg Start: 01/20/23 0800    Code Status: Full Code   Disposition Plan:  Level of care: Med-Surg Status is: Inpatient Remains inpatient appropriate     Consultants:  [facility]   Subjective: No SOB, feels well No pain  Objective: Vitals:   01/20/23 0919 01/20/23 1256 01/20/23 2012 01/21/23 0418  BP: (!) 149/59 (!) 143/69 (!) 148/74 131/80  Pulse: 60 67 98 92  Resp: 18 17 20 20   Temp: 98.3 F (36.8 C) 98.3 F (36.8 C) 98.4 F (36.9 C) 99 F (37.2 C)  TempSrc: Oral Oral Oral Oral  SpO2: 96% 96% (!) 89% 97%  Weight:      Height:       No intake or output data in the 24 hours ending 01/21/23 0739 Filed Weights   01/19/23 1414  Weight: 98 kg    Examination:   General: Appearance:     Overweight male in no acute distress     Lungs:     respirations unlabored  Heart:    Normal heart rate.    MS:   All extremities are intact.    Neurologic:   Awake, alert, oriented x 3. No apparent focal neurological            defect.        Data Reviewed: I have personally reviewed following labs and imaging studies  CBC: Recent Labs  Lab 01/19/23 1423 01/20/23 0515 01/21/23 0628  WBC 7.4 13.3* 9.1  NEUTROABS  --  11.1*  --   HGB 14.8 13.2 12.8*  HCT 47.3 40.9 38.9*  MCV 90.1 86.7 85.7  PLT 180 180 187   Basic Metabolic Panel: Recent Labs  Lab 01/19/23 1423 01/20/23 0515 01/21/23 0628  NA 137 141 138  K 4.1 4.5 3.7  CL 100 106 104  CO2 24 27 25   GLUCOSE 124* 88 91  BUN 13 15 15   CREATININE 1.07 1.27* 1.05  CALCIUM  9.2 9.0 8.8*   GFR: Estimated Creatinine Clearance: 61.3 mL/min (by C-G formula based on SCr of 1.05 mg/dL). Liver Function Tests: Recent Labs  Lab 01/19/23 1647 01/20/23 0515 01/21/23 0628  AST 272* 143* 59*  ALT 242* 201* 129*  ALKPHOS 311* 258* 222*  BILITOT 2.2* 2.2* 1.1  PROT 8.0 6.9 6.6  ALBUMIN 4.2 3.4* 3.4*   Recent Labs  Lab 01/19/23 1647  LIPASE 29   No results for input(s): AMMONIA in the last 168 hours. Coagulation Profile: No results for input(s): INR, PROTIME in the last 168 hours. Cardiac Enzymes: Recent Labs  Lab 01/19/23 1423  CKTOTAL 238   BNP (last 3 results) No results for input(s): PROBNP in the last 8760 hours. HbA1C: No results for input(s): HGBA1C in the last 72 hours. CBG: No results for input(s): GLUCAP in the last 168 hours. Lipid Profile: No results for input(s): CHOL, HDL, LDLCALC, TRIG, CHOLHDL, LDLDIRECT in the last 72 hours. Thyroid  Function Tests: No results for input(s): TSH, T4TOTAL, FREET4, T3FREE, THYROIDAB in the last 72 hours. Anemia Panel: No results for input(s): VITAMINB12, FOLATE, FERRITIN, TIBC, IRON, RETICCTPCT in the last 72 hours. Sepsis Labs: Recent Labs  Lab 01/19/23 1423 01/19/23 1626  LATICACIDVEN 2.6* 1.6    Recent Results (from the past 240 hours)  CULTURE, URINE COMPREHENSIVE     Status: Abnormal   Collection Time: 01/18/23 10:19 AM    Specimen: Urine  Result Value Ref Range Status   MICRO NUMBER: [id number]  Final   SPECIMEN QUALITY: Adequate  Final   Source OTHER (SPECIFY)  Final   STATUS: FINAL  Final   ISOLATE 1: Gram positive cocci isolated (A)  Final    Comment: 1,000-9,000 CFU/ML of Gram positive cocci isolated May represent colonizers from external and internal genitalia. No further testing (including susceptibility) will be performed.  Resp panel by RT-PCR (RSV, Flu A&B, Covid) Anterior Nasal Swab     Status: None   Collection Time: 01/19/23  2:16 PM   Specimen: Anterior Nasal Swab  Result Value Ref Range Status   SARS Coronavirus 2 by RT PCR NEGATIVE NEGATIVE Final    Comment: (NOTE) SARS-CoV-2 target nucleic acids are NOT DETECTED.  The SARS-CoV-2 RNA is generally detectable in upper respiratory specimens during the acute phase of infection. The lowest concentration of SARS-CoV-2 viral copies this assay can detect is 138 copies/mL. A negative result does not preclude SARS-Cov-2 infection and should not be used as the sole basis for treatment or other patient management decisions. A negative result may occur with  improper specimen collection/handling, submission of specimen other than nasopharyngeal swab, presence of viral mutation(s) within the areas targeted by this assay, and inadequate number of viral copies(<138 copies/mL). A negative result must be combined with clinical observations, patient history, and epidemiological information. The expected result is Negative.  Fact Sheet for Patients:  bloggercourse.com  Fact Sheet for Healthcare Providers:  seriousbroker.it  This test is no t yet approved or cleared by the [place]  FDA and  has been authorized for detection and/or diagnosis of SARS-CoV-2 by FDA under an Emergency Use Authorization (EUA). This EUA will remain  in effect (meaning this test can be used) for the duration of the COVID-19  declaration under Section 564(b)(1) of the Act, 21 U.S.C.section 360bbb-3(b)(1), unless the authorization is terminated  or revoked sooner.       Influenza A by PCR NEGATIVE NEGATIVE Final   Influenza B by PCR NEGATIVE NEGATIVE Final    Comment: (NOTE) The Xpert Xpress SARS-CoV-2/FLU/RSV plus assay is intended as an aid in the diagnosis of influenza from Nasopharyngeal swab specimens and should not be used as a sole basis for treatment. Nasal washings and aspirates are unacceptable for Xpert Xpress SARS-CoV-2/FLU/RSV testing.  Fact Sheet for Patients: bloggercourse.com  Fact Sheet for Healthcare Providers: seriousbroker.it  This test is not yet approved or cleared by the [place]  FDA and has been authorized for detection and/or diagnosis of SARS-CoV-2 by FDA under an Emergency Use Authorization (EUA). This EUA will remain in effect (meaning this test can be used) for the duration of  the COVID-19 declaration under Section 564(b)(1) of the Act, 21 U.S.C. section 360bbb-3(b)(1), unless the authorization is terminated or revoked.     Resp Syncytial Virus by PCR NEGATIVE NEGATIVE Final    Comment: (NOTE) Fact Sheet for Patients: bloggercourse.com  Fact Sheet for Healthcare Providers: seriousbroker.it  This test is not yet approved or cleared by the [place]  FDA and has been authorized for detection and/or diagnosis of SARS-CoV-2 by FDA under an Emergency Use Authorization (EUA). This EUA will remain in effect (meaning this test can be used) for the duration of the COVID-19 declaration under Section 564(b)(1) of the Act, 21 U.S.C. section 360bbb-3(b)(1), unless the authorization is terminated or revoked.  Performed at [facility], [address] [place], [place] [address]   Blood culture (single)     Status: None (Preliminary result)   Collection Time:  01/19/23  3:24 PM   Specimen: BLOOD  Result Value Ref Range Status   Specimen Description   Final    BLOOD BLOOD RIGHT FOREARM Performed at [facility], [address] [place], [place] [address]    Special Requests   Final    BOTTLES DRAWN AEROBIC AND ANAEROBIC Blood Culture results may not be optimal due to an inadequate volume of blood received in culture bottles Performed at [facility], [address], [place], [place] [address]    Culture  Setup Time   Final    GRAM NEGATIVE RODS IN BOTH AEROBIC AND ANAEROBIC BOTTLES KLEBSIELLA PNEUMONIAE ENTEROBACTER SPECIES CRITICAL RESULT CALLED TO, READ BACK BY AND VERIFIED WITH: JASON ROBBBINS @ 0458 01/20/23 [facility]    Culture GRAM NEGATIVE RODS  Final   Report Status PENDING  Incomplete  Blood Culture ID Panel (Reflexed)     Status: Abnormal   Collection Time: 01/19/23  3:24 PM  Result Value Ref Range Status   Enterococcus faecalis NOT DETECTED NOT DETECTED Final   Enterococcus Faecium NOT DETECTED NOT DETECTED Final   Listeria monocytogenes NOT DETECTED NOT DETECTED Final   Staphylococcus species NOT DETECTED NOT DETECTED Final   Staphylococcus aureus (BCID) NOT DETECTED NOT DETECTED Final   Staphylococcus epidermidis NOT DETECTED NOT DETECTED Final   Staphylococcus lugdunensis NOT DETECTED NOT DETECTED Final   Streptococcus species NOT DETECTED NOT DETECTED Final   Streptococcus agalactiae NOT DETECTED NOT DETECTED Final   Streptococcus pneumoniae NOT DETECTED NOT DETECTED Final   Streptococcus pyogenes NOT DETECTED NOT DETECTED Final   A.calcoaceticus-baumannii NOT DETECTED NOT DETECTED Final   Bacteroides fragilis NOT DETECTED NOT DETECTED Final   Enterobacterales DETECTED (A) NOT DETECTED Final    Comment: Enterobacterales represent a large order of gram negative bacteria, not a single organism. CRITICAL RESULT CALLED TO, READ BACK BY AND VERIFIED WITH: JASON ROBBINS @ 0458 01/20/23 [facility]    Enterobacter cloacae  complex NOT DETECTED NOT DETECTED Final   Escherichia coli NOT DETECTED NOT DETECTED Final   Klebsiella aerogenes NOT DETECTED NOT DETECTED Final   Klebsiella oxytoca NOT DETECTED NOT DETECTED Final   Klebsiella pneumoniae DETECTED (A) NOT DETECTED Final    Comment: CRITICAL RESULT CALLED TO, READ BACK BY AND VERIFIED WITH: JASON ROBBINS @ 0458 01/20/23 [facility]    Proteus species NOT DETECTED NOT DETECTED Final   Salmonella species NOT DETECTED NOT DETECTED Final   Serratia marcescens NOT DETECTED NOT DETECTED Final   Haemophilus influenzae NOT DETECTED NOT DETECTED Final   Neisseria meningitidis NOT DETECTED NOT DETECTED Final   Pseudomonas aeruginosa NOT DETECTED NOT DETECTED Final  Stenotrophomonas maltophilia NOT DETECTED NOT DETECTED Final   Candida albicans NOT DETECTED NOT DETECTED Final   Candida auris NOT DETECTED NOT DETECTED Final   Candida glabrata NOT DETECTED NOT DETECTED Final   Candida krusei NOT DETECTED NOT DETECTED Final   Candida parapsilosis NOT DETECTED NOT DETECTED Final   Candida tropicalis NOT DETECTED NOT DETECTED Final   Cryptococcus neoformans/gattii NOT DETECTED NOT DETECTED Final   CTX-M ESBL NOT DETECTED NOT DETECTED Final   Carbapenem resistance IMP NOT DETECTED NOT DETECTED Final   Carbapenem resistance KPC NOT DETECTED NOT DETECTED Final   Carbapenem resistance NDM NOT DETECTED NOT DETECTED Final   Carbapenem resist OXA 48 LIKE NOT DETECTED NOT DETECTED Final   Carbapenem resistance VIM NOT DETECTED NOT DETECTED Final    Comment: Performed at [facility], [address], [place], [place] [address]         Radiology Studies: MR ABDOMEN MRCP W WO CONTAST Result Date: 01/19/2023 CLINICAL DATA:  Right upper quadrant abdominal pain EXAM: MRI ABDOMEN WITHOUT AND WITH CONTRAST (INCLUDING MRCP) TECHNIQUE: Multiplanar multisequence MR imaging of the abdomen was performed both before and after the administration of intravenous contrast. Heavily  T2-weighted images of the biliary and pancreatic ducts were obtained, and three-dimensional MRCP images were rendered by post processing. CONTRAST:  10mL GADAVIST  GADOBUTROL  1 MMOL/ML IV SOLN COMPARISON:  CT abdomen pelvis, 01/19/2023 FINDINGS: Lower chest: No acute abnormality. Hepatobiliary: No solid liver abnormality is seen. Numerous small gallstones contracted in the gallbladder. Mild gallbladder wall thickening and trace pericholecystic fluid. Intra and extrahepatic biliary ductal dilatation, the common bile duct measuring up to 1.2 cm in caliber. At least three small gallstones in the common bile duct within the pancreatic head, including the largest at the ampulla measuring 0.7 cm (series 3, image 17, series 8, image 27). Pancreas: Unremarkable. No pancreatic ductal dilatation or surrounding inflammatory changes. Spleen: Normal in size without significant abnormality. Adrenals/Urinary Tract: Adrenal glands are unremarkable. Simple, benign left renal cortical cysts, for which no further follow-up or characterization is required. Kidneys are otherwise normal, without obvious renal calculi, solid lesion, or hydronephrosis. Stomach/Bowel: Stomach is within normal limits. No evidence of bowel wall thickening, distention, or inflammatory changes. Descending and sigmoid diverticulosis. Vascular/Lymphatic: No significant vascular findings are present. No enlarged abdominal lymph nodes. Other: No abdominal wall hernia or abnormality. No ascites. Musculoskeletal: No acute or significant osseous findings. IMPRESSION: 1. Cholelithiasis with mild gallbladder wall thickening and trace pericholecystic fluid. 2. Choledocholithiasis with intra and extrahepatic biliary ductal dilatation. At least three small gallstones in the common bile duct within the pancreatic head, including the largest at the ampulla measuring 0.7 cm. 3. Descending and sigmoid diverticulosis. Preliminary communication of findings by Dr. Scott to Dr.  Suzanne, 10:30 PM 01/19/2023. Electronically Signed   By: Marolyn JONETTA Jaksch M.D.   On: 01/19/2023 23:00   MR 3D Recon At Scanner Result Date: 01/19/2023 CLINICAL DATA:  Right upper quadrant abdominal pain EXAM: MRI ABDOMEN WITHOUT AND WITH CONTRAST (INCLUDING MRCP) TECHNIQUE: Multiplanar multisequence MR imaging of the abdomen was performed both before and after the administration of intravenous contrast. Heavily T2-weighted images of the biliary and pancreatic ducts were obtained, and three-dimensional MRCP images were rendered by post processing. CONTRAST:  10mL GADAVIST  GADOBUTROL  1 MMOL/ML IV SOLN COMPARISON:  CT abdomen pelvis, 01/19/2023 FINDINGS: Lower chest: No acute abnormality. Hepatobiliary: No solid liver abnormality is seen. Numerous small gallstones contracted in the gallbladder. Mild gallbladder wall thickening and trace pericholecystic fluid. Intra and  extrahepatic biliary ductal dilatation, the common bile duct measuring up to 1.2 cm in caliber. At least three small gallstones in the common bile duct within the pancreatic head, including the largest at the ampulla measuring 0.7 cm (series 3, image 17, series 8, image 27). Pancreas: Unremarkable. No pancreatic ductal dilatation or surrounding inflammatory changes. Spleen: Normal in size without significant abnormality. Adrenals/Urinary Tract: Adrenal glands are unremarkable. Simple, benign left renal cortical cysts, for which no further follow-up or characterization is required. Kidneys are otherwise normal, without obvious renal calculi, solid lesion, or hydronephrosis. Stomach/Bowel: Stomach is within normal limits. No evidence of bowel wall thickening, distention, or inflammatory changes. Descending and sigmoid diverticulosis. Vascular/Lymphatic: No significant vascular findings are present. No enlarged abdominal lymph nodes. Other: No abdominal wall hernia or abnormality. No ascites. Musculoskeletal: No acute or significant osseous findings.  IMPRESSION: 1. Cholelithiasis with mild gallbladder wall thickening and trace pericholecystic fluid. 2. Choledocholithiasis with intra and extrahepatic biliary ductal dilatation. At least three small gallstones in the common bile duct within the pancreatic head, including the largest at the ampulla measuring 0.7 cm. 3. Descending and sigmoid diverticulosis. Preliminary communication of findings by Dr. Scott to Dr. Suzanne, 10:30 PM 01/19/2023. Electronically Signed   By: Marolyn JONETTA Jaksch M.D.   On: 01/19/2023 23:00   CT ABDOMEN PELVIS WO CONTRAST Result Date: 01/19/2023 CLINICAL DATA:  Chills and shaking.  Abdominal/flank pain. EXAM: CT ABDOMEN AND PELVIS WITHOUT CONTRAST TECHNIQUE: Multidetector CT imaging of the abdomen and pelvis was performed following the standard protocol without IV contrast. RADIATION DOSE REDUCTION: This exam was performed according to the departmental dose-optimization program which includes automated exposure control, adjustment of the mA and/or kV according to patient size and/or use of iterative reconstruction technique. COMPARISON:  None Available. FINDINGS: Lower chest: Unremarkable Hepatobiliary: Calcifications and heterogeneous density in the gallbladder, most likely representing gallstones. Potential mild stranding around the gallbladder. Common bile duct about 1.3 cm in diameter, mildly abnormally dilated. No directly visualized choledocholithiasis although choledocholithiasis is often occult on CT. Pancreas: Unremarkable Spleen: Unremarkable Adrenals/Urinary Tract: Fullness of both adrenal glands without discrete mass. Fluid density lesions of the left kidney favoring cysts, largest 1.6 cm in diameter. Symmetric bilateral perirenal stranding. No urinary tract calculi are identified. No urinary bladder wall thickening. No hydronephrosis or hydroureter. Stomach/Bowel: Transverse duodenal diverticulum without findings of inflammation. Normal appendix. Descending and sigmoid colon  diverticulosis, no findings of active diverticulitis. No dilated bowel. There are some scattered small noninflamed diverticula of the terminal ileum. Vascular/Lymphatic: Mild aortoiliac atheromatous vascular calcification. Reproductive: Mild prostatomegaly. Other: No supplemental non-categorized findings. Musculoskeletal: Lucency and trabecular thickening throughout the T12 vertebral body and posterior elements, suspicious for Paget's disease of bone given the diffuse vertebral involvement. Suspected right foraminal impingement at T11-12 due to facet spurring. Congenitally short pedicles along with spondylosis and degenerative disc disease cause multilevel impingement in the lower lumbar spine. Degenerative arthropathy of both hips. IMPRESSION: 1. Cholelithiasis. Potential mild stranding around the gallbladder. Common bile duct about 1.3 cm in diameter, mildly abnormally dilated. No directly visualized choledocholithiasis although choledocholithiasis is often occult on CT. Correlate with bilirubin levels and other indicators in assessing for potential biliary obstruction or acute cholecystitis. 2. Descending and sigmoid colon diverticulosis, no findings of active diverticulitis. 3. Mild prostatomegaly. 4. 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 5. Right foraminal impingement at T11-12 due to facet spurring. 6. Congenitally short pedicles along with spondylosis and degenerative disc disease cause multilevel impingement in the lower lumbar spine. 7.  Degenerative arthropathy of both hips. 8. Mild aortoiliac atheromatous vascular calcification. Aortic Atherosclerosis (ICD10-I70.0). Electronically Signed   By: Ryan Salvage M.D.   On: 01/19/2023 18:40   DG Chest 2 View Result Date: 01/19/2023 CLINICAL DATA:  Cough EXAM: CHEST - 2 VIEW COMPARISON:  Report 03/29/2001 FINDINGS: No acute airspace disease or effusion. Normal cardiac size. No pneumothorax. IMPRESSION: No active cardiopulmonary  disease. Electronically Signed   By: Luke Bun M.D.   On: 01/19/2023 17:16        Scheduled Meds:  amLODipine   10 mg Oral Daily   atorvastatin   10 mg Oral Daily   enoxaparin  (LOVENOX ) injection  40 mg Subcutaneous Q24H   tamsulosin   0.8 mg Oral Daily   Continuous Infusions:  cefTRIAXone  (ROCEPHIN )  IV Stopped (01/20/23 1038)     LOS: 1 day    Time spent: 45 minutes spent on chart review, discussion with nursing staff, consultants, updating family and interview/physical exam; more than 50% of that time was spent in counseling and/or coordination of care.    Harlene RAYMOND Bowl, DO [facility] Available via Epic secure chat 7am-7pm After these hours, please refer to coverage provider listed on [email or web address] 01/21/2023, 7:39 AM

## 2023-01-21 NOTE — Progress Notes (Signed)
 [facility] Inpatient Progress Note    Subjective: Patient seen for follow up of choledocholithiasis, elevated liver enzymes, possible early cholecystitis. Appreciate surgical consultation from Dr. Tye. Patient still clinically stable without abdominal pain, jaundice, pruritus or fever.   Objective: Vital signs in last 24 hours: Temp:  [98.2 F (36.8 C)-99 F (37.2 C)] 98.2 F (36.8 C) (01/05 0845) Pulse Rate:  [51-98] 51 (01/05 0845) Resp:  [20] 20 (01/05 0418) BP: (131-148)/(74-80) 134/74 (01/05 0845) SpO2:  [89 %-97 %] 92 % (01/05 0845) Blood pressure 134/74, pulse (!) 51, temperature 98.2 F (36.8 C), temperature source Oral, resp. rate 20, height 6' (1.829 m), weight 98 kg, SpO2 92%.    Intake/Output from previous day: No intake/output data recorded.  Intake/Output this shift: No intake/output data recorded.   Gen: NAD. Appears comfortable.  HEENT: [place]/AT. PERRLA. Normal external ear exam.  Chest: CTA, no wheezes.  CV: RR nl S1, S2. No gallops.  Abd: soft, nt, nd. BS+  Ext: no edema. Pulses 2+  Neuro: Alert and oriented. Judgement appears normal. Nonfocal.   Lab Results: Results for orders placed or performed during the hospital encounter of 01/19/23 (from the past 24 hours)  Comprehensive metabolic panel     Status: Abnormal   Collection Time: 01/21/23  6:28 AM  Result Value Ref Range   Sodium 138 135 - 145 mmol/L   Potassium 3.7 3.5 - 5.1 mmol/L   Chloride 104 98 - 111 mmol/L   CO2 25 22 - 32 mmol/L   Glucose, Bld 91 70 - 99 mg/dL   BUN 15 8 - 23 mg/dL   Creatinine, Ser 8.94 0.61 - 1.24 mg/dL   Calcium  8.8 (L) 8.9 - 10.3 mg/dL   Total Protein 6.6 6.5 - 8.1 g/dL   Albumin 3.4 (L) 3.5 - 5.0 g/dL   AST 59 (H) 15 - 41 U/L   ALT 129 (H) 0 - 44 U/L   Alkaline Phosphatase 222 (H) 38 - 126 U/L   Total Bilirubin 1.1 0.0 - 1.2 mg/dL   GFR, Estimated >39 >39 mL/min   Anion gap 9 5 - 15  CBC     Status: Abnormal   Collection Time:  01/21/23  6:28 AM  Result Value Ref Range   WBC 9.1 4.0 - 10.5 K/uL   RBC 4.54 4.22 - 5.81 MIL/uL   Hemoglobin 12.8 (L) 13.0 - 17.0 g/dL   HCT 61.0 (L) 60.9 - 47.9 %   MCV 85.7 80.0 - 100.0 fL   MCH 28.2 26.0 - 34.0 pg   MCHC 32.9 30.0 - 36.0 g/dL   RDW 85.1 88.4 - 84.4 %   Platelets 187 150 - 400 K/uL   nRBC 0.0 0.0 - 0.2 %     Recent Labs    01/19/23 1423 01/20/23 0515 01/21/23 0628  WBC 7.4 13.3* 9.1  HGB 14.8 13.2 12.8*  HCT 47.3 40.9 38.9*  PLT 180 180 187   BMET Recent Labs    01/19/23 1423 01/20/23 0515 01/21/23 0628  NA 137 141 138  K 4.1 4.5 3.7  CL 100 106 104  CO2 24 27 25   GLUCOSE 124* 88 91  BUN 13 15 15   CREATININE 1.07 1.27* 1.05  CALCIUM  9.2 9.0 8.8*   LFT Recent Labs    01/19/23 1647 01/20/23 0515 01/21/23 0628  PROT 8.0   < > 6.6  ALBUMIN 4.2   < > 3.4*  AST 272*   < > 59*  ALT 242*   < >  129*  ALKPHOS 311*   < > 222*  BILITOT 2.2*   < > 1.1  BILIDIR 1.3*  --   --   IBILI 0.9  --   --    < > = values in this interval not displayed.   PT/INR No results for input(s): LABPROT, INR in the last 72 hours. Hepatitis Panel No results for input(s): HEPBSAG, HCVAB, HEPAIGM, HEPBIGM in the last 72 hours. C-Diff No results for input(s): CDIFFTOX in the last 72 hours. No results for input(s): CDIFFPCR in the last 72 hours.   Studies/Results: MR ABDOMEN MRCP W WO CONTAST Result Date: 01/19/2023 CLINICAL DATA:  Right upper quadrant abdominal pain EXAM: MRI ABDOMEN WITHOUT AND WITH CONTRAST (INCLUDING MRCP) TECHNIQUE: Multiplanar multisequence MR imaging of the abdomen was performed both before and after the administration of intravenous contrast. Heavily T2-weighted images of the biliary and pancreatic ducts were obtained, and three-dimensional MRCP images were rendered by post processing. CONTRAST:  10mL GADAVIST  GADOBUTROL  1 MMOL/ML IV SOLN COMPARISON:  CT abdomen pelvis, 01/19/2023 FINDINGS: Lower chest: No acute abnormality.  Hepatobiliary: No solid liver abnormality is seen. Numerous small gallstones contracted in the gallbladder. Mild gallbladder wall thickening and trace pericholecystic fluid. Intra and extrahepatic biliary ductal dilatation, the common bile duct measuring up to 1.2 cm in caliber. At least three small gallstones in the common bile duct within the pancreatic head, including the largest at the ampulla measuring 0.7 cm (series 3, image 17, series 8, image 27). Pancreas: Unremarkable. No pancreatic ductal dilatation or surrounding inflammatory changes. Spleen: Normal in size without significant abnormality. Adrenals/Urinary Tract: Adrenal glands are unremarkable. Simple, benign left renal cortical cysts, for which no further follow-up or characterization is required. Kidneys are otherwise normal, without obvious renal calculi, solid lesion, or hydronephrosis. Stomach/Bowel: Stomach is within normal limits. No evidence of bowel wall thickening, distention, or inflammatory changes. Descending and sigmoid diverticulosis. Vascular/Lymphatic: No significant vascular findings are present. No enlarged abdominal lymph nodes. Other: No abdominal wall hernia or abnormality. No ascites. Musculoskeletal: No acute or significant osseous findings. IMPRESSION: 1. Cholelithiasis with mild gallbladder wall thickening and trace pericholecystic fluid. 2. Choledocholithiasis with intra and extrahepatic biliary ductal dilatation. At least three small gallstones in the common bile duct within the pancreatic head, including the largest at the ampulla measuring 0.7 cm. 3. Descending and sigmoid diverticulosis. Preliminary communication of findings by Dr. Scott to Dr. Suzanne, 10:30 PM 01/19/2023. Electronically Signed   By: Marolyn JONETTA Jaksch M.D.   On: 01/19/2023 23:00   MR 3D Recon At Scanner Result Date: 01/19/2023 CLINICAL DATA:  Right upper quadrant abdominal pain EXAM: MRI ABDOMEN WITHOUT AND WITH CONTRAST (INCLUDING MRCP) TECHNIQUE:  Multiplanar multisequence MR imaging of the abdomen was performed both before and after the administration of intravenous contrast. Heavily T2-weighted images of the biliary and pancreatic ducts were obtained, and three-dimensional MRCP images were rendered by post processing. CONTRAST:  10mL GADAVIST  GADOBUTROL  1 MMOL/ML IV SOLN COMPARISON:  CT abdomen pelvis, 01/19/2023 FINDINGS: Lower chest: No acute abnormality. Hepatobiliary: No solid liver abnormality is seen. Numerous small gallstones contracted in the gallbladder. Mild gallbladder wall thickening and trace pericholecystic fluid. Intra and extrahepatic biliary ductal dilatation, the common bile duct measuring up to 1.2 cm in caliber. At least three small gallstones in the common bile duct within the pancreatic head, including the largest at the ampulla measuring 0.7 cm (series 3, image 17, series 8, image 27). Pancreas: Unremarkable. No pancreatic ductal dilatation or surrounding inflammatory changes. Spleen: Normal  in size without significant abnormality. Adrenals/Urinary Tract: Adrenal glands are unremarkable. Simple, benign left renal cortical cysts, for which no further follow-up or characterization is required. Kidneys are otherwise normal, without obvious renal calculi, solid lesion, or hydronephrosis. Stomach/Bowel: Stomach is within normal limits. No evidence of bowel wall thickening, distention, or inflammatory changes. Descending and sigmoid diverticulosis. Vascular/Lymphatic: No significant vascular findings are present. No enlarged abdominal lymph nodes. Other: No abdominal wall hernia or abnormality. No ascites. Musculoskeletal: No acute or significant osseous findings. IMPRESSION: 1. Cholelithiasis with mild gallbladder wall thickening and trace pericholecystic fluid. 2. Choledocholithiasis with intra and extrahepatic biliary ductal dilatation. At least three small gallstones in the common bile duct within the pancreatic head, including the  largest at the ampulla measuring 0.7 cm. 3. Descending and sigmoid diverticulosis. Preliminary communication of findings by Dr. Scott to Dr. Suzanne, 10:30 PM 01/19/2023. Electronically Signed   By: Marolyn JONETTA Jaksch M.D.   On: 01/19/2023 23:00   CT ABDOMEN PELVIS WO CONTRAST Result Date: 01/19/2023 CLINICAL DATA:  Chills and shaking.  Abdominal/flank pain. EXAM: CT ABDOMEN AND PELVIS WITHOUT CONTRAST TECHNIQUE: Multidetector CT imaging of the abdomen and pelvis was performed following the standard protocol without IV contrast. RADIATION DOSE REDUCTION: This exam was performed according to the departmental dose-optimization program which includes automated exposure control, adjustment of the mA and/or kV according to patient size and/or use of iterative reconstruction technique. COMPARISON:  None Available. FINDINGS: Lower chest: Unremarkable Hepatobiliary: Calcifications and heterogeneous density in the gallbladder, most likely representing gallstones. Potential mild stranding around the gallbladder. Common bile duct about 1.3 cm in diameter, mildly abnormally dilated. No directly visualized choledocholithiasis although choledocholithiasis is often occult on CT. Pancreas: Unremarkable Spleen: Unremarkable Adrenals/Urinary Tract: Fullness of both adrenal glands without discrete mass. Fluid density lesions of the left kidney favoring cysts, largest 1.6 cm in diameter. Symmetric bilateral perirenal stranding. No urinary tract calculi are identified. No urinary bladder wall thickening. No hydronephrosis or hydroureter. Stomach/Bowel: Transverse duodenal diverticulum without findings of inflammation. Normal appendix. Descending and sigmoid colon diverticulosis, no findings of active diverticulitis. No dilated bowel. There are some scattered small noninflamed diverticula of the terminal ileum. Vascular/Lymphatic: Mild aortoiliac atheromatous vascular calcification. Reproductive: Mild prostatomegaly. Other: No supplemental  non-categorized findings. Musculoskeletal: Lucency and trabecular thickening throughout the T12 vertebral body and posterior elements, suspicious for Paget's disease of bone given the diffuse vertebral involvement. Suspected right foraminal impingement at T11-12 due to facet spurring. Congenitally short pedicles along with spondylosis and degenerative disc disease cause multilevel impingement in the lower lumbar spine. Degenerative arthropathy of both hips. IMPRESSION: 1. Cholelithiasis. Potential mild stranding around the gallbladder. Common bile duct about 1.3 cm in diameter, mildly abnormally dilated. No directly visualized choledocholithiasis although choledocholithiasis is often occult on CT. Correlate with bilirubin levels and other indicators in assessing for potential biliary obstruction or acute cholecystitis. 2. Descending and sigmoid colon diverticulosis, no findings of active diverticulitis. 3. Mild prostatomegaly. 4. 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 5. Right foraminal impingement at T11-12 due to facet spurring. 6. Congenitally short pedicles along with spondylosis and degenerative disc disease cause multilevel impingement in the lower lumbar spine. 7. Degenerative arthropathy of both hips. 8. Mild aortoiliac atheromatous vascular calcification. Aortic Atherosclerosis (ICD10-I70.0). Electronically Signed   By: Ryan Salvage M.D.   On: 01/19/2023 18:40   DG Chest 2 View Result Date: 01/19/2023 CLINICAL DATA:  Cough EXAM: CHEST - 2 VIEW COMPARISON:  Report 03/29/2001 FINDINGS: No acute airspace disease or effusion. Normal  cardiac size. No pneumothorax. IMPRESSION: No active cardiopulmonary disease. Electronically Signed   By: Luke Bun M.D.   On: 01/19/2023 17:16    Scheduled Inpatient Medications:    amLODipine   10 mg Oral Daily   atorvastatin   10 mg Oral Daily   enoxaparin  (LOVENOX ) injection  40 mg Subcutaneous Q24H   tamsulosin   0.8 mg Oral Daily     Continuous Inpatient Infusions:    cefTRIAXone  (ROCEPHIN )  IV 2 g (01/21/23 0951)    PRN Inpatient Medications:  HYDROmorphone  (DILAUDID ) injection, melatonin  Miscellaneous: N/A.  Assessment:  Choledocholithiasis - Clinically stable, essentially asymptomatic on IV  Ceftriaxone . 2.   Elevated LAE's secondary to above.- Mildly improved today. 3.  Multiple gallstones with pericholecystic fluid on CT. Dr. Tye to perform elective cholecystectomy if patient agrees, patient not yet decided on pursuing surgery but is leaning toward it.  4. Advanced age.  5. CKD Stage II.  Plan:  Continue current management. ERCP tomorrow. The patient understands the nature of the planned procedure, indications, risks, alternatives and potential complications including but not limited to bleeding, infection, perforation, damage to internal organs and possible oversedation/side effects from anesthesia. Also patient notified of the low 5-8% chance of pancreatitis. The patient agrees and gives consent to proceed.  Please refer to procedure notes for findings, recommendations and patient disposition/instructions per Dr. Jinny.   Emmalea Treanor K. Aundria, M.D. 01/21/2023, 1:51 PM

## 2023-01-22 ENCOUNTER — Inpatient Hospital Stay: Payer: Medicare Other

## 2023-01-22 ENCOUNTER — Inpatient Hospital Stay: Payer: Medicare Other | Admitting: Anesthesiology

## 2023-01-22 ENCOUNTER — Telehealth: Payer: Self-pay | Admitting: Internal Medicine

## 2023-01-22 ENCOUNTER — Encounter: Payer: Self-pay | Admitting: Family Medicine

## 2023-01-22 ENCOUNTER — Encounter
Admission: EM | Disposition: A | Discharge status: Home / Self Care | Payer: Self-pay | Source: Home / Self Care | Attending: Internal Medicine

## 2023-01-22 DIAGNOSIS — N401 Enlarged prostate with lower urinary tract symptoms: Secondary | ICD-10-CM | POA: Diagnosis not present

## 2023-01-22 DIAGNOSIS — K8050 Calculus of bile duct without cholangitis or cholecystitis without obstruction: Secondary | ICD-10-CM | POA: Diagnosis not present

## 2023-01-22 DIAGNOSIS — K8042 Calculus of bile duct with acute cholecystitis without obstruction: Secondary | ICD-10-CM | POA: Diagnosis not present

## 2023-01-22 DIAGNOSIS — B961 Klebsiella pneumoniae [K. pneumoniae] as the cause of diseases classified elsewhere: Secondary | ICD-10-CM

## 2023-01-22 DIAGNOSIS — R7881 Bacteremia: Secondary | ICD-10-CM | POA: Diagnosis present

## 2023-01-22 DIAGNOSIS — N400 Enlarged prostate without lower urinary tract symptoms: Secondary | ICD-10-CM | POA: Diagnosis present

## 2023-01-22 HISTORY — PX: REMOVAL OF STONES: SHX5545

## 2023-01-22 HISTORY — PX: ERCP: SHX5425

## 2023-01-22 HISTORY — PX: SPHINCTEROTOMY: SHX5279

## 2023-01-22 HISTORY — PX: HOT HEMOSTASIS: SHX5433

## 2023-01-22 LAB — CULTURE, BLOOD (SINGLE)

## 2023-01-22 LAB — GLUCOSE, CAPILLARY
Glucose-Capillary: 89 mg/dL (ref 70–99)
Glucose-Capillary: 140 mg/dL — ABNORMAL HIGH (ref 70–99)

## 2023-01-22 SURGERY — ERCP, WITH INTERVENTION IF INDICATED
Anesthesia: General

## 2023-01-22 MED ORDER — LACTATED RINGERS IV SOLN: INTRAVENOUS | Status: DC

## 2023-01-22 MED ORDER — ONDANSETRON HCL 4 MG/2ML IJ SOLN
INTRAMUSCULAR | Status: AC
  Filled 2023-01-22: qty 2

## 2023-01-22 MED ORDER — INSULIN ASPART 100 UNIT/ML IJ SOLN
0.0000 [IU] | Freq: Three times a day (TID) | INTRAMUSCULAR | Status: DC
  Administered 2023-01-23: 2 [IU] via SUBCUTANEOUS
  Filled 2023-01-22: qty 1

## 2023-01-22 MED ORDER — SUCCINYLCHOLINE CHLORIDE 200 MG/10ML IV SOSY
PREFILLED_SYRINGE | INTRAVENOUS | Status: DC | PRN
  Administered 2023-01-22: 100 mg via INTRAVENOUS

## 2023-01-22 MED ORDER — DICLOFENAC SUPPOSITORY 100 MG
100.0000 mg | Freq: Once | RECTAL | Status: AC
  Administered 2023-01-22: 100 mg via RECTAL

## 2023-01-22 MED ORDER — PHENYLEPHRINE 80 MCG/ML (10ML) SYRINGE FOR IV PUSH (FOR BLOOD PRESSURE SUPPORT)
PREFILLED_SYRINGE | INTRAVENOUS | Status: DC | PRN
  Administered 2023-01-22 (×2): 160 ug via INTRAVENOUS

## 2023-01-22 MED ORDER — LIDOCAINE HCL (CARDIAC) PF 100 MG/5ML IV SOSY
PREFILLED_SYRINGE | INTRAVENOUS | Status: DC | PRN
  Administered 2023-01-22: 100 mg via INTRAVENOUS

## 2023-01-22 MED ORDER — DICLOFENAC SUPPOSITORY 100 MG
RECTAL | Status: AC
  Filled 2023-01-22: qty 1

## 2023-01-22 MED ORDER — EPHEDRINE SULFATE-NACL 50-0.9 MG/10ML-% IV SOSY
PREFILLED_SYRINGE | INTRAVENOUS | Status: DC | PRN
  Administered 2023-01-22: 10 mg via INTRAVENOUS
  Administered 2023-01-22: 15 mg via INTRAVENOUS

## 2023-01-22 MED ORDER — ONDANSETRON HCL 4 MG/2ML IJ SOLN
INTRAMUSCULAR | Status: DC | PRN
  Administered 2023-01-22: 4 mg via INTRAVENOUS

## 2023-01-22 MED ORDER — LIDOCAINE HCL (PF) 2 % IJ SOLN
INTRAMUSCULAR | Status: AC
  Filled 2023-01-22: qty 15

## 2023-01-22 MED ORDER — PROPOFOL 10 MG/ML IV BOLUS
INTRAVENOUS | Status: DC | PRN
  Administered 2023-01-22: 30 mg via INTRAVENOUS
  Administered 2023-01-22: 50 mg via INTRAVENOUS
  Administered 2023-01-22: 120 mg via INTRAVENOUS

## 2023-01-22 MED ORDER — DICLOFENAC SUPPOSITORY 100 MG
100.0000 mg | Freq: Once | RECTAL | Status: DC
  Administered 2023-01-22: 100 mg via RECTAL
  Filled 2023-01-22: qty 1

## 2023-01-22 MED ORDER — DEXAMETHASONE SODIUM PHOSPHATE 10 MG/ML IJ SOLN
INTRAMUSCULAR | Status: DC | PRN
  Administered 2023-01-22: 10 mg via INTRAVENOUS

## 2023-01-22 MED ORDER — DEXAMETHASONE SODIUM PHOSPHATE 10 MG/ML IJ SOLN
INTRAMUSCULAR | Status: AC
Start: 2023-01-22 — End: ?
  Filled 2023-01-22: qty 1

## 2023-01-22 NOTE — Consult Note (Signed)
 NAME: Blake Rivera  DOB: 12/09/1934  MRN: [id number]  Date/Time: 01/22/2023 2:37 PM  REQUESTING PROVIDER: Dr.Yates Subjective:  REASON FOR CONSULT: Klebsiella bacteremia ? Blake Rivera is a 89 y.o. with a history of Hypertension presented to the ED on 01/19/2023 with sudden onset chills and not feeling well.  He has also had dark-colored urine for the last couple a day.  He had pain to his PCPs office on 01/18/2023 and was prescribed doxycycline  after urine culture was sent.   In the ED vitals of temperature of 99.8, BP 120/76, pulse 72, respiratory rate 18, sats 94%. WBC was 7.4, Hb 14.8, platelet 180, creatinine 1.07. AST 272, ALT 242, total bili 2.2, alkaline phosphatase 311. CT scan showed cholelithiasis with stranding around the gallbladder.  The common bile duct was dilated to 1.3 cm.  There was descending and sigmoid colon diverticulosis with no active diverticulitis There was mild prostatomegaly There was suspected Paget's disease involving the T12 vertebral body. MRI showed cholelithiasis with mild gallbladder wall thickening and trace pericholecystic fluid.  There was choledocholithiasis with intra and extrahepatic biliary duct dilatation. Blood culture was sent and he was started on pip-tazo The culture came back positive for Klebsiella and the patient antibiotic has been changed to ceftriaxone  He was seen by gastroenterologist and taken for ERCP today and underwent Biliary sphincterotomy and balloon extraction of the choledocholithiasis. I am seeing the patient because of Klebsiella bacteremia Patient is sitting in chair His family is in his room Patient says he had a good dinner with fish and vegetables.  He has mild epigastric pain since then.  Past Medical History:  Diagnosis Date   Diverticulitis of colon    ED (erectile dysfunction)    Hyperglycemia    Hypertension    Vitamin D  deficiency     Past Surgical History:  Procedure Laterality Date   HERNIA REPAIR      ROTATOR CUFF REPAIR  03/17/2018    Social History   Socioeconomic History   Marital status: Married    Spouse name: Not on file   Number of children: 2   Years of education: Not on file   Highest education level: Doctorate  Occupational History   Occupation: Retired  Tobacco Use   Smoking status: Former    Current packs/day: 0.00    Average packs/day: 0.3 packs/day for 25.0 years (6.3 ttl pk-yrs)    Types: Cigarettes    Start date: 05/17/1989    Quit date: 05/18/2014    Years since quitting: 8.6   Smokeless tobacco: Never   Tobacco comments:    Former smoker - no need to provide with smoking cessation materials  Vaping Use   Vaping status: Never Used  Substance and Sexual Activity   Alcohol use: Yes    Alcohol/week: 1.0 standard drink of alcohol    Types: 1 Shots of liquor per week    Comment: moderate - 1 whiskey shot per day   Drug use: No   Sexual activity: Yes  Other Topics Concern   Not on file  Social History Narrative   Not on file   Social Drivers of Health   Financial Resource Strain: Low Risk  (03/02/2022)   Overall Financial Resource Strain (CARDIA)    Difficulty of Paying Living Expenses: Not hard at all  Food Insecurity: No Food Insecurity (01/20/2023)   Hunger Vital Sign    Worried About Running Out of Food in the Last Year: Never true    Ran Out of  Food in the Last Year: Never true  Transportation Needs: No Transportation Needs (01/20/2023)   PRAPARE - Administrator, Civil Service (Medical): No    Lack of Transportation (Non-Medical): No  Physical Activity: Sufficiently Active (03/02/2022)   Exercise Vital Sign    Days of Exercise per Week: 3 days    Minutes of Exercise per Session: 60 min  Stress: No Stress Concern Present (03/02/2022)   [employer] of Occupational Health - Occupational Stress Questionnaire    Feeling of Stress : Not at all  Social Connections: Socially Integrated (01/20/2023)   Social Connection and Isolation Panel  [NHANES]    Frequency of Communication with Friends and Family: More than three times a week    Frequency of Social Gatherings with Friends and Family: Twice a week    Attends Religious Services: More than 4 times per year    Active Member of [employer] or Organizations: Yes    Attends Engineer, Structural: More than 4 times per year    Marital Status: Married  Catering Manager Violence: Not At Risk (01/20/2023)   Humiliation, Afraid, Rape, and Kick questionnaire    Fear of Current or Ex-Partner: No    Emotionally Abused: No    Physically Abused: No    Sexually Abused: No    Family History  Problem Relation Age of Onset   Heart disease Mother    Aneurysm Mother    Heart disease Brother    Emphysema Father    Healthy Sister    Healthy Brother    Healthy Brother    Heart disease Sister    Allergies  Allergen Reactions   Lisinopril     Angioedema   Omacor  [Omega-3-Acid Ethyl Esters (Fish)]     Other reaction(s): Rash   Zocor  [Simvastatin]     Muscle Pain   I? Current Facility-Administered Medications  Medication Dose Route Frequency Provider Last Rate Last Admin   0.9 %  sodium chloride  infusion  250 mL Intravenous Continuous [place]edo, Teodoro K, MD       [MAR Hold] amLODipine  (NORVASC ) tablet 10 mg  10 mg Oral Daily Crosley, Debby, MD   10 mg at 01/22/23 0904   [MAR Hold] atorvastatin  (LIPITOR) tablet 10 mg  10 mg Oral Daily Crosley, Debby, MD   10 mg at 01/22/23 0904   [MAR Hold] cefTRIAXone  (ROCEPHIN ) 2 g in sodium chloride  0.9 % 100 mL IVPB  2 g Intravenous Q24H Crosley, Debby, MD 200 mL/hr at 01/22/23 0903 2 g at 01/22/23 0903   [MAR Hold] enoxaparin  (LOVENOX ) injection 40 mg  40 mg Subcutaneous Q24H Crosley, Debby, MD   40 mg at 01/22/23 0904   [MAR Hold] HYDROmorphone  (DILAUDID ) injection 0.5 mg  0.5 mg Intravenous Q3H PRN Laveda Roosevelt, MD       lactated ringers  infusion   Intravenous Continuous Jinny Carmine, MD 125 mL/hr at 01/22/23 1338 Restarted at 01/22/23 1434    [MAR Hold] melatonin tablet 5 mg  5 mg Oral QHS PRN Laveda Roosevelt, MD       [MAR Hold] sodium chloride  flush (NS) 0.9 % injection 3-10 mL  3-10 mL Intravenous Q12H Toledo, Teodoro K, MD   3 mL at 01/22/23 0906   [facility] Hold] sodium chloride  flush (NS) 0.9 % injection 3-10 mL  3-10 mL Intravenous PRN Toledo, Teodoro K, MD       [MAR Hold] tamsulosin  (FLOMAX ) capsule 0.8 mg  0.8 mg Oral Daily Laveda Roosevelt, MD  0.8 mg at 01/22/23 9095     Abtx:  Anti-infectives (From admission, onward)    Start     Dose/Rate Route Frequency Ordered Stop   01/20/23 0800  [MAR Hold]  cefTRIAXone  (ROCEPHIN ) 2 g in sodium chloride  0.9 % 100 mL IVPB        (MAR Hold since Mon 01/22/2023 at 1323.Hold Reason: Transfer to a Procedural area)   2 g 200 mL/hr over 30 Minutes Intravenous Every 24 hours 01/20/23 0504     01/20/23 0600  piperacillin -tazobactam (ZOSYN ) IVPB 3.375 g  Status:  Discontinued        3.375 g 12.5 mL/hr over 240 Minutes Intravenous Every 8 hours 01/20/23 0035 01/20/23 0503   01/19/23 2330  piperacillin -tazobactam (ZOSYN ) IVPB 3.375 g        3.375 g 100 mL/hr over 30 Minutes Intravenous  Once 01/19/23 2323 01/20/23 0010       REVIEW OF SYSTEMS:  Const: negative fever, had chills, negative weight loss Eyes: negative diplopia or visual changes, negative eye pain ENT: negative coryza, negative sore throat Resp: negative cough, hemoptysis, dyspnea Cards: negative for chest pain, palpitations, lower extremity edema GU: negative for frequency, dysuria and hematuria GI: abdominal pain, no diarrhea, bleeding, constipation Skin: negative for rash and pruritus Heme: negative for easy bruising and gum/nose bleeding Neurolo:negative for headaches, dizziness, vertigo, memory problems  Psych: negative for feelings of anxiety, depression  Endocrine: negative for thyroid , diabetes Allergy/Immunology- : Lisinopril, omega-3, simvastatin   On examination patient appears young for his age, sitting in  chair, no distress VITALS:  BP (!) 104/59   Pulse (!) 31   Temp (!) 97.3 F (36.3 C) (Temporal)   Resp 12   Ht 6' (1.829 m)   Wt 98 kg   SpO2 100%   BMI 29.29 kg/m   PHYSICAL EXAM:  General: Alert, cooperative, no distress, Head: Normocephalic, without obvious abnormality, atraumatic. Eyes: Conjunctivae clear, anicteric sclerae. Pupils are equal ENT Nares normal. No drainage or sinus tenderness. Lips, mucosa, and tongue normal. No Thrush Neck:  symmetrical, no adenopathy, thyroid : non tender no carotid bruit and no JVD. Back: No CVA tenderness. Lungs: Clear to auscultation bilaterally. No Wheezing or Rhonchi. No rales. Heart: Regular rate and rhythm, no murmur, rub or gallop. Abdomen: Soft, non-tender,not distended. Bowel sounds normal. No masses Extremities: atraumatic, no cyanosis. No edema. No clubbing Skin: No rashes or lesions. Or bruising Lymph: Cervical, supraclavicular normal. Neurologic: Grossly non-focal Pertinent Labs Lab Results CBC    Component Value Date/Time   WBC 9.1 01/21/2023 0628   RBC 4.54 01/21/2023 0628   HGB 12.8 (L) 01/21/2023 0628   HGB 15.1 08/26/2014 1020   HCT 38.9 (L) 01/21/2023 0628   HCT 46.0 08/26/2014 1020   PLT 187 01/21/2023 0628   PLT 202 08/26/2014 1020   MCV 85.7 01/21/2023 0628   MCV 90 08/26/2014 1020   MCH 28.2 01/21/2023 0628   MCHC 32.9 01/21/2023 0628   RDW 14.8 01/21/2023 0628   RDW 14.2 08/26/2014 1020   LYMPHSABS 1.2 01/20/2023 0515   LYMPHSABS 2.3 08/26/2014 1020   MONOABS 0.9 01/20/2023 0515   EOSABS 0.0 01/20/2023 0515   EOSABS 0.1 08/26/2014 1020   BASOSABS 0.0 01/20/2023 0515   BASOSABS 0.0 08/26/2014 1020       Latest Ref Rng & Units 01/21/2023    6:28 AM 01/20/2023    5:15 AM 01/19/2023    4:47 PM  CMP  Glucose 70 - 99 mg/dL 91  88  BUN 8 - 23 mg/dL 15  15    Creatinine 9.38 - 1.24 mg/dL 8.94  8.72    Sodium 864 - 145 mmol/L 138  141    Potassium 3.5 - 5.1 mmol/L 3.7  4.5    Chloride 98 - 111 mmol/L  104  106    CO2 22 - 32 mmol/L 25  27    Calcium  8.9 - 10.3 mg/dL 8.8  9.0    Total Protein 6.5 - 8.1 g/dL 6.6  6.9  8.0   Total Bilirubin 0.0 - 1.2 mg/dL 1.1  2.2  2.2   Alkaline Phos 38 - 126 U/L 222  258  311   AST 15 - 41 U/L 59  143  272   ALT 0 - 44 U/L 129  201  242       Microbiology: Recent Results (from the past 240 hours)  CULTURE, URINE COMPREHENSIVE     Status: Abnormal   Collection Time: 01/18/23 10:19 AM   Specimen: Urine  Result Value Ref Range Status   MICRO NUMBER: [id number]  Final   SPECIMEN QUALITY: Adequate  Final   Source OTHER (SPECIFY)  Final   STATUS: FINAL  Final   ISOLATE 1: Gram positive cocci isolated (A)  Final    Comment: 1,000-9,000 CFU/ML of Gram positive cocci isolated May represent colonizers from external and internal genitalia. No further testing (including susceptibility) will be performed.  Resp panel by RT-PCR (RSV, Flu A&B, Covid) Anterior Nasal Swab     Status: None   Collection Time: 01/19/23  2:16 PM   Specimen: Anterior Nasal Swab  Result Value Ref Range Status   SARS Coronavirus 2 by RT PCR NEGATIVE NEGATIVE Final    Comment: (NOTE) SARS-CoV-2 target nucleic acids are NOT DETECTED.  The SARS-CoV-2 RNA is generally detectable in upper respiratory specimens during the acute phase of infection. The lowest concentration of SARS-CoV-2 viral copies this assay can detect is 138 copies/mL. A negative result does not preclude SARS-Cov-2 infection and should not be used as the sole basis for treatment or other patient management decisions. A negative result may occur with  improper specimen collection/handling, submission of specimen other than nasopharyngeal swab, presence of viral mutation(s) within the areas targeted by this assay, and inadequate number of viral copies(<138 copies/mL). A negative result must be combined with clinical observations, patient history, and epidemiological information. The expected result is Negative.  Fact  Sheet for Patients:  bloggercourse.com  Fact Sheet for Healthcare Providers:  seriousbroker.it  This test is no t yet approved or cleared by the [place]  FDA and  has been authorized for detection and/or diagnosis of SARS-CoV-2 by FDA under an Emergency Use Authorization (EUA). This EUA will remain  in effect (meaning this test can be used) for the duration of the COVID-19 declaration under Section 564(b)(1) of the Act, 21 U.S.C.section 360bbb-3(b)(1), unless the authorization is terminated  or revoked sooner.       Influenza A by PCR NEGATIVE NEGATIVE Final   Influenza B by PCR NEGATIVE NEGATIVE Final    Comment: (NOTE) The Xpert Xpress SARS-CoV-2/FLU/RSV plus assay is intended as an aid in the diagnosis of influenza from Nasopharyngeal swab specimens and should not be used as a sole basis for treatment. Nasal washings and aspirates are unacceptable for Xpert Xpress SARS-CoV-2/FLU/RSV testing.  Fact Sheet for Patients: bloggercourse.com  Fact Sheet for Healthcare Providers: seriousbroker.it  This test is not yet approved or cleared by the [place]  FDA and  has been authorized for detection and/or diagnosis of SARS-CoV-2 by FDA under an Emergency Use Authorization (EUA). This EUA will remain in effect (meaning this test can be used) for the duration of the COVID-19 declaration under Section 564(b)(1) of the Act, 21 U.S.C. section 360bbb-3(b)(1), unless the authorization is terminated or revoked.     Resp Syncytial Virus by PCR NEGATIVE NEGATIVE Final    Comment: (NOTE) Fact Sheet for Patients: bloggercourse.com  Fact Sheet for Healthcare Providers: seriousbroker.it  This test is not yet approved or cleared by the [place]  FDA and has been authorized for detection and/or diagnosis of SARS-CoV-2 by FDA under an  Emergency Use Authorization (EUA). This EUA will remain in effect (meaning this test can be used) for the duration of the COVID-19 declaration under Section 564(b)(1) of the Act, 21 U.S.C. section 360bbb-3(b)(1), unless the authorization is terminated or revoked.  Performed at [facility], [address] [place], [place] [address]   Blood culture (single)     Status: Abnormal   Collection Time: 01/19/23  3:24 PM   Specimen: BLOOD  Result Value Ref Range Status   Specimen Description   Final    BLOOD BLOOD RIGHT FOREARM Performed at [facility], [address] [place], [place] [address]    Special Requests   Final    BOTTLES DRAWN AEROBIC AND ANAEROBIC Blood Culture results may not be optimal due to an inadequate volume of blood received in culture bottles Performed at [facility], [address] [place], [place] [address]    Culture  Setup Time   Final    GRAM NEGATIVE RODS IN BOTH AEROBIC AND ANAEROBIC BOTTLES CRITICAL RESULT CALLED TO, READ BACK BY AND VERIFIED WITH: JASON ROBBBINS @ 0458 01/20/23 [facility]    Culture KLEBSIELLA PNEUMONIAE (A)  Final   Report Status 01/22/2023 FINAL  Final   Organism ID, Bacteria KLEBSIELLA PNEUMONIAE  Final   Organism ID, Bacteria KLEBSIELLA PNEUMONIAE  Final      Susceptibility   Klebsiella pneumoniae - KIRBY BAUER*    CEFAZOLIN  SENSITIVE Sensitive    Klebsiella pneumoniae - MIC*    AMPICILLIN RESISTANT Resistant     CEFEPIME <=0.12 SENSITIVE Sensitive     CEFTAZIDIME <=1 SENSITIVE Sensitive     CEFTRIAXONE  <=0.25 SENSITIVE Sensitive     CIPROFLOXACIN <=0.25 SENSITIVE Sensitive     GENTAMICIN  <=1 SENSITIVE Sensitive     IMIPENEM <=0.25 SENSITIVE Sensitive     TRIMETH /SULFA  <=20 SENSITIVE Sensitive     AMPICILLIN/SULBACTAM <=2 SENSITIVE Sensitive     PIP/TAZO <=4 SENSITIVE Sensitive ug/mL    * KLEBSIELLA PNEUMONIAE    KLEBSIELLA PNEUMONIAE  Blood Culture ID Panel (Reflexed)     Status: Abnormal    Collection Time: 01/19/23  3:24 PM  Result Value Ref Range Status   Enterococcus faecalis NOT DETECTED NOT DETECTED Final   Enterococcus Faecium NOT DETECTED NOT DETECTED Final   Listeria monocytogenes NOT DETECTED NOT DETECTED Final   Staphylococcus species NOT DETECTED NOT DETECTED Final   Staphylococcus aureus (BCID) NOT DETECTED NOT DETECTED Final   Staphylococcus epidermidis NOT DETECTED NOT DETECTED Final   Staphylococcus lugdunensis NOT DETECTED NOT DETECTED Final   Streptococcus species NOT DETECTED NOT DETECTED Final   Streptococcus agalactiae NOT DETECTED NOT DETECTED Final   Streptococcus pneumoniae NOT DETECTED NOT DETECTED Final   Streptococcus pyogenes NOT DETECTED NOT DETECTED Final   A.calcoaceticus-baumannii NOT DETECTED NOT DETECTED Final   Bacteroides fragilis NOT DETECTED NOT DETECTED Final  Enterobacterales DETECTED (A) NOT DETECTED Final    Comment: Enterobacterales represent a large order of gram negative bacteria, not a single organism. CRITICAL RESULT CALLED TO, READ BACK BY AND VERIFIED WITH: JASON ROBBINS @ 0458 01/20/23 [facility]    Enterobacter cloacae complex NOT DETECTED NOT DETECTED Final   Escherichia coli NOT DETECTED NOT DETECTED Final   Klebsiella aerogenes NOT DETECTED NOT DETECTED Final   Klebsiella oxytoca NOT DETECTED NOT DETECTED Final   Klebsiella pneumoniae DETECTED (A) NOT DETECTED Final    Comment: CRITICAL RESULT CALLED TO, READ BACK BY AND VERIFIED WITH: JASON ROBBINS @ 0458 01/20/23 [facility]    Proteus species NOT DETECTED NOT DETECTED Final   Salmonella species NOT DETECTED NOT DETECTED Final   Serratia marcescens NOT DETECTED NOT DETECTED Final   Haemophilus influenzae NOT DETECTED NOT DETECTED Final   Neisseria meningitidis NOT DETECTED NOT DETECTED Final   Pseudomonas aeruginosa NOT DETECTED NOT DETECTED Final   Stenotrophomonas maltophilia NOT DETECTED NOT DETECTED Final   Candida albicans NOT DETECTED NOT DETECTED Final   Candida auris NOT  DETECTED NOT DETECTED Final   Candida glabrata NOT DETECTED NOT DETECTED Final   Candida krusei NOT DETECTED NOT DETECTED Final   Candida parapsilosis NOT DETECTED NOT DETECTED Final   Candida tropicalis NOT DETECTED NOT DETECTED Final   Cryptococcus neoformans/gattii NOT DETECTED NOT DETECTED Final   CTX-M ESBL NOT DETECTED NOT DETECTED Final   Carbapenem resistance IMP NOT DETECTED NOT DETECTED Final   Carbapenem resistance KPC NOT DETECTED NOT DETECTED Final   Carbapenem resistance NDM NOT DETECTED NOT DETECTED Final   Carbapenem resist OXA 48 LIKE NOT DETECTED NOT DETECTED Final   Carbapenem resistance VIM NOT DETECTED NOT DETECTED Final    Comment: Performed at [facility], [address] [place], [place] [address]    IMAGING RESULTS: Ct scan reviewed Cholecystitis, choledocholithiasis I have personally reviewed the films ? Impression/Recommendation Klebsiella bacteremia secondary to choledocholithiasis Choledocholithiasis, acute cholecystitis and possible ascending cholangitis Underwent ERCP and removal of biliary stones Patient is currently on ceftriaxone  Continues with the same antibiotic Repeat blood culture negative so far Patient is going for lap chole tomorrow  Hypertension on amlodipine   Hyperlipidemia on atorvastatin   Diabetes on insulin ? _Sliding scale  BPH on tamsulosin   Discussed the management with the patient, his family and requesting provider

## 2023-01-22 NOTE — Progress Notes (Signed)
 Subjective:  CC: Blake Rivera is a 89 y.o. male  Hospital stay day 2, choledocolithiasis  HPI: No changes.  S/p ERCP  ROS:  General: Denies weight loss, weight gain, fatigue, fevers, chills, and night sweats. Heart: Denies chest pain, palpitations, racing heart, irregular heartbeat, leg pain or swelling, and decreased activity tolerance. Respiratory: Denies breathing difficulty, shortness of breath, wheezing, cough, and sputum. GI: Denies change in appetite, heartburn, nausea, vomiting, constipation, diarrhea, and blood in stool. GU: Denies difficulty urinating, pain with urinating, urgency, frequency, blood in urine.   Objective:   Temp:  [97 F (36.1 C)-98.5 F (36.9 C)] 97.6 F (36.4 C) (01/06 1633) Pulse Rate:  [31-92] 51 (01/06 1633) Resp:  [12-19] 18 (01/06 1633) BP: (85-147)/(53-84) 116/63 (01/06 1633) SpO2:  [95 %-100 %] 96 % (01/06 1633)     Height: 6' (182.9 cm) Weight: 98 kg BMI (Calculated): 29.29   Intake/Output this shift:   Intake/Output Summary (Last 24 hours) at 01/22/2023 1729 Last data filed at 01/22/2023 1434 Gross per 24 hour  Intake 900 ml  Output 0 ml  Net 900 ml    Constitutional :  alert, cooperative, appears stated age, and no distress  Respiratory:  clear to auscultation bilaterally  Cardiovascular:  regular rate and rhythm  Gastrointestinal: soft, non-tender; bowel sounds normal; no masses,  no organomegaly.   Skin: Cool and moist.   Psychiatric: Normal affect, non-agitated, not confused       LABS:     Latest Ref Rng & Units 01/21/2023    6:28 AM 01/20/2023    5:15 AM 01/19/2023    4:47 PM  CMP  Glucose 70 - 99 mg/dL 91  88    BUN 8 - 23 mg/dL 15  15    Creatinine 9.38 - 1.24 mg/dL 8.94  8.72    Sodium 864 - 145 mmol/L 138  141    Potassium 3.5 - 5.1 mmol/L 3.7  4.5    Chloride 98 - 111 mmol/L 104  106    CO2 22 - 32 mmol/L 25  27    Calcium  8.9 - 10.3 mg/dL 8.8  9.0    Total Protein 6.5 - 8.1 g/dL 6.6  6.9  8.0   Total Bilirubin 0.0  - 1.2 mg/dL 1.1  2.2  2.2   Alkaline Phos 38 - 126 U/L 222  258  311   AST 15 - 41 U/L 59  143  272   ALT 0 - 44 U/L 129  201  242       Latest Ref Rng & Units 01/21/2023    6:28 AM 01/20/2023    5:15 AM 01/19/2023    2:23 PM  CBC  WBC 4.0 - 10.5 K/uL 9.1  13.3  7.4   Hemoglobin 13.0 - 17.0 g/dL 87.1  86.7  85.1   Hematocrit 39.0 - 52.0 % 38.9  40.9  47.3   Platelets 150 - 400 K/uL 187  180  180     RADS: N/A Assessment:   Choledocholithiasis.  S/p ERCP.  Pt now agreeable to robo lap chole.  Will schedule for tomorrow.  labs/images/medications/previous chart entries reviewed personally and relevant changes/updates noted above.

## 2023-01-22 NOTE — Progress Notes (Signed)
 Progress Note   Patient: Blake Rivera FMW:[id number] DOB: [birth year] DOA: 01/19/2023     2 DOS: the patient was seen and examined on 01/22/2023   Brief hospital course: 88yo with h/o HTN and HLD who presented with chills, found to have bacteremia with choledocholithiasis and cholecystitis.  He is scheduled for ERCP today with likely plan for cholecystectomy to follow.    Assessment and Plan:  Klebsiella pneumoniae bacteremia Patient presenting with chills Blood cultures from admission grew Klebsiella pneumoniae, resistant only to Ampicillin ID culture re: duration of treatment Will repeat blood cultures to ensure clearance  Choledocholithiasis Patient presenting with no specific abdominal pain Imaging with CT/MRCP showing cholelithiasis with choledocholithiasis Empiric coverage with Ceftriaxone  GI consulted ERCP 1/6 with cholangiogram showing corresponding filling defect, s/p complete removal of choledocholithiasis by biliary sphincterotomy and balloon extraction He is open to lap chole on 1/7 with hopeful dc to home 1/7-1/8   HTN Continue amlodipine   DM Recent A1c was 6.1, good control Hold Glucophage  Cover with moderate-scale SSI    Dyslipidemia Continue atorvastatin    BPH Continue tamsulosin   Abnormal thoracic imaging CT A/P with 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 The score is based  on dignosis, age, lab data, medications, orders, and past utilization.   Low:  0-14.9   Medium: 15-21.9   High: 22-29.9   Extreme: 30 and above           Subjective: Feeling fine - seen walking around the unit prior to ERCP.  No chest pain.   Objective: Vitals:   01/22/23 1434 01/22/23 1444  BP: (!) 104/59 (!) 85/49  Pulse: (!) 31 66  Resp: 12 18  Temp: (!) 97.3 F (36.3 C)   SpO2: 100% 98%    Intake/Output Summary (Last 24 hours) at 01/22/2023 1446 Last data filed at 01/22/2023 1434 Gross per 24 hour  Intake 900 ml  Output 0 ml  Net 900 ml   Filed Weights   01/19/23 1414  Weight: 98 kg    Exam:  General:  Appears calm and comfortable and is in NAD Eyes:  PERRL, EOMI, normal lids, iris ENT:  grossly normal hearing, lips & tongue, mmm Neck:  no LAD, masses or thyromegaly Cardiovascular:  RRR, no m/r/g. No LE edema.  Respiratory:   CTA bilaterally with no wheezes/rales/rhonchi.  Normal respiratory effort. Abdomen:  soft, mild TTP in epigastric region and RUQ Skin:  no rash or induration seen on limited exam Musculoskeletal:  grossly normal tone BUE/BLE, good ROM, no bony abnormality Psychiatric:  grossly normal mood and affect, speech fluent and appropriate, AOx3 Neurologic:  CN 2-12 grossly intact, moves all extremities in coordinated fashion Data Reviewed: I have reviewed the patient's lab results since admission.  Pertinent labs for today include:  Unremarkable BMP AP 222, down from 311 AST 59/ALT 129/bili 1.1, improved WBC 9.1 Hgb 12.8  Blood cultures + Klebsiella pneumoniae, sensitivities pending   Family Communication: None present;  he declined to have me call his daughter (who is a pediatrician in [place])  Disposition: Status is: Inpatient Remains inpatient appropriate because: needs surgical intervention     Time spent: 50 minutes  Unresulted Labs (From admission, onward)     Start     Ordered   01/23/23 0500  CBC with  Differential/Platelet  Tomorrow morning,   R        01/22/23 1444   01/23/23 0500  Comprehensive metabolic panel  Tomorrow morning,   R        01/22/23 1444   01/22/23 1438  Culture, blood (Routine X 2) w Reflex to ID Panel  BLOOD CULTURE X 2,   TIMED      01/22/23 1437             Author: Delon Herald, MD 01/22/2023 2:46 PM  For on call review [email or web address].

## 2023-01-22 NOTE — Telephone Encounter (Signed)
 Copied from CRM [id number]. Topic: General - Other >> Jan 22, 2023  9:24 AM Epimenio Foot F wrote: Reason for CRM: Pt's wife is calling in because she received a call regarding pt but doesn't know what the call was about.

## 2023-01-22 NOTE — Op Note (Signed)
 [facility] Patient Name: Blake Rivera Procedure Date: 01/22/2023 10:17 AM MRN: [id number] Account #: [id number] Date of Birth: [birth year] Admit Type: Inpatient Age: 89 Room: [facility] ENDO ROOM 4 Gender: Male Note Status: Finalized Instrument Name: [id number] Procedure:             ERCP Indications:           Common bile duct stone(s) Providers:             Rogelia Copping MD, MD Referring MD:          Sharyle Fischer (Referring MD) Medicines:             General Anesthesia Complications:         No immediate complications. Procedure:             Pre-Anesthesia Assessment:                        - Prior to the procedure, a History and Physical was                         performed, and patient medications and allergies were                         reviewed. The patient's tolerance of previous                         anesthesia was also reviewed. The risks and benefits                         of the procedure and the sedation options and risks                         were discussed with the patient. All questions were                         answered, and informed consent was obtained. Prior                         Anticoagulants: The patient has taken no anticoagulant                         or antiplatelet agents. ASA Grade Assessment: II - A                         patient with mild systemic disease. After reviewing                         the risks and benefits, the patient was deemed in                         satisfactory condition to undergo the procedure.                        After obtaining informed consent, the scope was passed                         under direct vision. Throughout the procedure, the  patient's blood pressure, pulse, and oxygen                         saturations were monitored continuously. The                         Duodenoscope was introduced through the mouth, and                          used to inject contrast into and used to inject                         contrast into the bile duct. The ERCP was accomplished                         without difficulty. The patient tolerated the                         procedure well. Findings:      The scout film was normal. The esophagus was successfully intubated       under direct vision. The scope was advanced to a normal major papilla in       the descending duodenum without detailed examination of the pharynx,       larynx and associated structures, and upper GI tract. The upper GI tract       was grossly normal. The bile duct was deeply cannulated with the       short-nosed traction sphincterotome. Contrast was injected. I personally       interpreted the bile duct images. There was brisk flow of contrast       through the ducts. Image quality was excellent. Contrast extended to the       entire biliary tree. The lower third of the main bile duct contained       filling defect(s) thought to be a stone. A wire was passed into the       biliary tree. An 8 mm biliary sphincterotomy was made with a traction       (standard) sphincterotome using ERBE electrocautery. There was no       post-sphincterotomy bleeding. The biliary tree was swept with a 15 mm       balloon starting at the bifurcation. Many stones were removed. No stones       remained. Impression:            - A filling defect consistent with a stone was seen on                         the cholangiogram.                        - Choledocholithiasis was found. Complete removal was                         accomplished by biliary sphincterotomy and balloon                         extraction.                        - A biliary sphincterotomy was performed.                        -  The biliary tree was swept. Recommendation:        - Return patient to hospital ward for ongoing care.                        - Clear liquid diet today.                        - Watch for  pancreatitis, bleeding, perforation, and                         cholangitis. Procedure Code(s):     --- Professional ---                        [phone], Endoscopic retrograde cholangiopancreatography                         (ERCP); with removal of calculi/debris from                         biliary/pancreatic duct(s)                        [id number], Endoscopic retrograde cholangiopancreatography                         (ERCP); with sphincterotomy/papillotomy                        [phone], Endoscopic catheterization of the biliary                         ductal system, radiological supervision and                         interpretation Diagnosis Code(s):     --- Professional ---                        K80.50, Calculus of bile duct without cholangitis or                         cholecystitis without obstruction CPT copyright 2022 American Medical Association. All rights reserved. The codes documented in this report are preliminary and upon coder review may  be revised to meet current compliance requirements. Rogelia Copping MD, MD 01/22/2023 2:23:37 PM This report has been signed electronically. Number of Addenda: 0 Note Initiated On: 01/22/2023 10:17 AM Estimated Blood Loss:  Estimated blood loss: none.      [facility]

## 2023-01-22 NOTE — Plan of Care (Signed)

## 2023-01-22 NOTE — Anesthesia Procedure Notes (Signed)
 Procedure Name: Intubation Date/Time: 01/22/2023 1:49 PM  Performed by: Stacie Channel, CRNAPre-anesthesia Checklist: Patient identified, Emergency Drugs available, Suction available, Patient being monitored and Timeout performed Patient Re-evaluated:Patient Re-evaluated prior to induction Oxygen Delivery Method: Circle system utilized Preoxygenation: Pre-oxygenation with 100% oxygen Induction Type: IV induction and Rapid sequence Laryngoscope Size: McGrath and 4 Grade View: Grade I Tube type: Oral Tube size: 7.0 mm Number of attempts: 1 Placement Confirmation: ETT inserted through vocal cords under direct vision, positive ETCO2, CO2 detector and breath sounds checked- equal and bilateral Secured at: 23 cm Tube secured with: Tape

## 2023-01-22 NOTE — Anesthesia Preprocedure Evaluation (Signed)
 Anesthesia Evaluation  Patient identified by MRN, date of birth, ID band Patient awake    Reviewed: Allergy & Precautions, H&P , NPO status , Patient's Chart, lab work & pertinent test results, reviewed documented beta blocker date and time   History of Anesthesia Complications Negative for: history of anesthetic complications  Airway Mallampati: I  TM Distance: >3 FB Neck ROM: full    Dental  (+) Caps, Dental Advidsory Given, Poor Dentition, Missing   Pulmonary neg pulmonary ROS, former smoker   Pulmonary exam normal breath sounds clear to auscultation       Cardiovascular Exercise Tolerance: Good hypertension, (-) angina (-) Past MI and (-) Cardiac Stents Normal cardiovascular exam(-) dysrhythmias (-) Valvular Problems/Murmurs Rhythm:regular Rate:Normal     Neuro/Psych negative neurological ROS  negative psych ROS   GI/Hepatic negative GI ROS, Neg liver ROS,,,  Endo/Other  diabetes, Well Controlled, Type 2, Oral Hypoglycemic Agents    Renal/GU CRFRenal disease  negative genitourinary   Musculoskeletal   Abdominal   Peds  Hematology negative hematology ROS (+)   Anesthesia Other Findings Past Medical History: No date: Diverticulitis of colon No date: ED (erectile dysfunction) No date: Hyperglycemia No date: Hypertension No date: Vitamin D  deficiency   Reproductive/Obstetrics negative OB ROS                             Anesthesia Physical Anesthesia Plan  ASA: 2  Anesthesia Plan: General   Post-op Pain Management:    Induction: Intravenous  PONV Risk Score and Plan: 2 and Ondansetron , Dexamethasone  and Treatment may vary due to age or medical condition  Airway Management Planned: Oral ETT  Additional Equipment:   Intra-op Plan:   Post-operative Plan: Extubation in OR  Informed Consent: I have reviewed the patients History and Physical, chart, labs and discussed the  procedure including the risks, benefits and alternatives for the proposed anesthesia with the patient or authorized representative who has indicated his/her understanding and acceptance.     Dental Advisory Given  Plan Discussed with: Anesthesiologist, CRNA and Surgeon  Anesthesia Plan Comments:         Anesthesia Quick Evaluation

## 2023-01-22 NOTE — Telephone Encounter (Signed)
 Informed wife no one at this office called her

## 2023-01-22 NOTE — Transfer of Care (Signed)
 Immediate Anesthesia Transfer of Care Note  Patient: Blake Rivera  Procedure(s) Performed: ENDOSCOPIC RETROGRADE CHOLANGIOPANCREATOGRAPHY (ERCP) REMOVAL OF STONES  Patient Location: PACU  Anesthesia Type:General  Level of Consciousness: sedated  Airway & Oxygen Therapy: Patient Spontanous Breathing  Post-op Assessment: Report given to RN and Post -op Vital signs reviewed and stable  Post vital signs: Reviewed and stable  Last Vitals:  Vitals Value Taken Time  BP 104/59 01/22/23 1434  Temp 36.3 C 01/22/23 1434  Pulse 50 01/22/23 1434  Resp 17 01/22/23 1435  SpO2 99 % 01/22/23 1434  Vitals shown include unfiled device data.  Last Pain:  Vitals:   01/22/23 1434  TempSrc: Temporal  PainSc: Asleep         Complications: No notable events documented.

## 2023-01-22 NOTE — Hospital Course (Addendum)
 89yo with h/o HTN and HLD who presented with chills, found to have bacteremia with choledocholithiasis and cholecystitis.  Successful ERCP with balloon extraction on 1/6, lap chole on 1/7.

## 2023-01-23 ENCOUNTER — Inpatient Hospital Stay: Payer: Medicare Other | Admitting: General Practice

## 2023-01-23 ENCOUNTER — Encounter
Admission: EM | Disposition: A | Discharge status: Home / Self Care | Payer: Self-pay | Source: Home / Self Care | Attending: Internal Medicine

## 2023-01-23 ENCOUNTER — Encounter: Payer: Self-pay | Admitting: Family Medicine

## 2023-01-23 DIAGNOSIS — N401 Enlarged prostate with lower urinary tract symptoms: Secondary | ICD-10-CM | POA: Diagnosis not present

## 2023-01-23 DIAGNOSIS — B961 Klebsiella pneumoniae [K. pneumoniae] as the cause of diseases classified elsewhere: Secondary | ICD-10-CM | POA: Diagnosis not present

## 2023-01-23 DIAGNOSIS — R7881 Bacteremia: Secondary | ICD-10-CM | POA: Diagnosis not present

## 2023-01-23 DIAGNOSIS — K8042 Calculus of bile duct with acute cholecystitis without obstruction: Secondary | ICD-10-CM | POA: Diagnosis not present

## 2023-01-23 LAB — GLUCOSE, CAPILLARY
Glucose-Capillary: 131 mg/dL — ABNORMAL HIGH (ref 70–99)
Glucose-Capillary: 126 mg/dL — ABNORMAL HIGH (ref 70–99)
Glucose-Capillary: 98 mg/dL (ref 70–99)
Glucose-Capillary: 98 mg/dL (ref 70–99)
Glucose-Capillary: 128 mg/dL — ABNORMAL HIGH (ref 70–99)
Glucose-Capillary: 110 mg/dL — ABNORMAL HIGH (ref 70–99)
Glucose-Capillary: 77 mg/dL (ref 70–99)

## 2023-01-23 LAB — CBC WITH DIFFERENTIAL/PLATELET
Abs Immature Granulocytes: 0.04 10*3/uL (ref 0.00–0.07)
Basophils Absolute: 0 10*3/uL (ref 0.0–0.1)
Basophils Relative: 0 %
Eosinophils Absolute: 0 10*3/uL (ref 0.0–0.5)
Eosinophils Relative: 0 %
HCT: 44.1 % (ref 39.0–52.0)
Hemoglobin: 14.3 g/dL (ref 13.0–17.0)
Immature Granulocytes: 1 %
Lymphocytes Relative: 12 %
Lymphs Abs: 0.7 10*3/uL (ref 0.7–4.0)
MCH: 28.1 pg (ref 26.0–34.0)
MCHC: 32.4 g/dL (ref 30.0–36.0)
MCV: 86.6 fL (ref 80.0–100.0)
Monocytes Absolute: 0.3 10*3/uL (ref 0.1–1.0)
Monocytes Relative: 5 %
Neutro Abs: 4.7 10*3/uL (ref 1.7–7.7)
Neutrophils Relative %: 82 %
Platelets: 194 10*3/uL (ref 150–400)
RBC: 5.09 MIL/uL (ref 4.22–5.81)
RDW: 14.4 % (ref 11.5–15.5)
WBC: 5.7 10*3/uL (ref 4.0–10.5)
nRBC: 0 % (ref 0.0–0.2)

## 2023-01-23 LAB — COMPREHENSIVE METABOLIC PANEL
ALT: 77 U/L — ABNORMAL HIGH (ref 0–44)
AST: 28 U/L (ref 15–41)
Albumin: 3.6 g/dL (ref 3.5–5.0)
Alkaline Phosphatase: 188 U/L — ABNORMAL HIGH (ref 38–126)
Anion gap: 11 (ref 5–15)
BUN: 18 mg/dL (ref 8–23)
CO2: 26 mmol/L (ref 22–32)
Calcium: 9 mg/dL (ref 8.9–10.3)
Chloride: 101 mmol/L (ref 98–111)
Creatinine, Ser: 0.94 mg/dL (ref 0.61–1.24)
GFR, Estimated: >60 mL/min (ref >60)
Glucose, Bld: 126 mg/dL — ABNORMAL HIGH (ref 70–99)
Potassium: 4.2 mmol/L (ref 3.5–5.1)
Sodium: 138 mmol/L (ref 135–145)
Total Bilirubin: 0.8 mg/dL (ref 0.0–1.2)
Total Protein: 7.1 g/dL (ref 6.5–8.1)

## 2023-01-23 SURGERY — CHOLECYSTECTOMY, ROBOT-ASSISTED, LAPAROSCOPIC
Anesthesia: General

## 2023-01-23 MED ORDER — LIDOCAINE-EPINEPHRINE 1 %-1:100000 IJ SOLN
INTRAMUSCULAR | Status: AC
  Filled 2023-01-23: qty 1

## 2023-01-23 MED ORDER — SUGAMMADEX SODIUM 200 MG/2ML IV SOLN
INTRAVENOUS | Status: DC | PRN
  Administered 2023-01-23: 200 mg via INTRAVENOUS

## 2023-01-23 MED ORDER — FENTANYL CITRATE (PF) 100 MCG/2ML IJ SOLN
INTRAMUSCULAR | Status: AC
  Filled 2023-01-23: qty 2

## 2023-01-23 MED ORDER — DEXMEDETOMIDINE HCL IN NACL 80 MCG/20ML IV SOLN
INTRAVENOUS | Status: DC | PRN
  Administered 2023-01-23: 4 ug via INTRAVENOUS
  Administered 2023-01-23: 8 ug via INTRAVENOUS

## 2023-01-23 MED ORDER — 0.9 % SODIUM CHLORIDE (POUR BTL) OPTIME
TOPICAL | Status: DC | PRN
  Administered 2023-01-23: 500 mL

## 2023-01-23 MED ORDER — PROPOFOL 10 MG/ML IV BOLUS
INTRAVENOUS | Status: AC
  Filled 2023-01-23: qty 20

## 2023-01-23 MED ORDER — PHENYLEPHRINE 80 MCG/ML (10ML) SYRINGE FOR IV PUSH (FOR BLOOD PRESSURE SUPPORT)
PREFILLED_SYRINGE | INTRAVENOUS | Status: DC | PRN
  Administered 2023-01-23 (×2): 80 ug via INTRAVENOUS

## 2023-01-23 MED ORDER — LACTATED RINGERS IV SOLN: INTRAVENOUS | Status: DC | PRN

## 2023-01-23 MED ORDER — ONDANSETRON HCL 4 MG/2ML IJ SOLN
INTRAMUSCULAR | Status: DC | PRN
  Administered 2023-01-23: 4 mg via INTRAVENOUS

## 2023-01-23 MED ORDER — LIDOCAINE HCL (CARDIAC) PF 100 MG/5ML IV SOSY
PREFILLED_SYRINGE | INTRAVENOUS | Status: DC | PRN
  Administered 2023-01-23: 80 mg via INTRAVENOUS
  Administered 2023-01-23: 100 mg via INTRAVENOUS

## 2023-01-23 MED ORDER — PROPOFOL 10 MG/ML IV BOLUS
INTRAVENOUS | Status: DC | PRN
  Administered 2023-01-23: 100 mg via INTRAVENOUS

## 2023-01-23 MED ORDER — BUPIVACAINE HCL (PF) 0.5 % IJ SOLN
INTRAMUSCULAR | Status: AC
  Filled 2023-01-23: qty 30

## 2023-01-23 MED ORDER — INDOCYANINE GREEN 25 MG IV SOLR
INTRAVENOUS | Status: AC
  Filled 2023-01-23: qty 10

## 2023-01-23 MED ORDER — EPHEDRINE SULFATE-NACL 50-0.9 MG/10ML-% IV SOSY
PREFILLED_SYRINGE | INTRAVENOUS | Status: DC | PRN
  Administered 2023-01-23: 5 mg via INTRAVENOUS

## 2023-01-23 MED ORDER — GLYCOPYRROLATE 0.2 MG/ML IJ SOLN
INTRAMUSCULAR | Status: DC | PRN
  Administered 2023-01-23: .2 mg via INTRAVENOUS

## 2023-01-23 MED ORDER — LIDOCAINE-EPINEPHRINE (PF) 1 %-1:200000 IJ SOLN
INTRAMUSCULAR | Status: DC | PRN
  Administered 2023-01-23: 30 mL via INTRAMUSCULAR

## 2023-01-23 MED ORDER — KETOROLAC TROMETHAMINE 30 MG/ML IJ SOLN
INTRAMUSCULAR | Status: DC | PRN
  Administered 2023-01-23: 30 mg via INTRAVENOUS

## 2023-01-23 MED ORDER — INDOCYANINE GREEN 25 MG IV SOLR
1.2500 mg | Freq: Once | INTRAVENOUS | Status: AC
  Administered 2023-01-23: 1.25 mg via INTRAVENOUS
  Filled 2023-01-23: qty 10

## 2023-01-23 MED ORDER — OXYCODONE HCL 5 MG/5ML PO SOLN: 5.0000 mg | Freq: Once | ORAL | Status: DC | PRN

## 2023-01-23 MED ORDER — ACETAMINOPHEN 10 MG/ML IV SOLN
INTRAVENOUS | Status: AC
Start: 2023-01-23 — End: ?
  Filled 2023-01-23: qty 100

## 2023-01-23 MED ORDER — FENTANYL CITRATE (PF) 100 MCG/2ML IJ SOLN
25.0000 ug | INTRAMUSCULAR | Status: DC | PRN
Start: 2023-01-23 — End: 2023-01-23

## 2023-01-23 MED ORDER — DEXAMETHASONE SODIUM PHOSPHATE 10 MG/ML IJ SOLN
INTRAMUSCULAR | Status: DC | PRN
  Administered 2023-01-23: 5 mg via INTRAVENOUS

## 2023-01-23 MED ORDER — OXYCODONE HCL 5 MG PO TABS: 5.0000 mg | ORAL_TABLET | Freq: Once | ORAL | Status: DC | PRN

## 2023-01-23 MED ORDER — FENTANYL CITRATE (PF) 100 MCG/2ML IJ SOLN
INTRAMUSCULAR | Status: DC | PRN
  Administered 2023-01-23: 50 ug via INTRAVENOUS
  Administered 2023-01-23 (×2): 25 ug via INTRAVENOUS
  Administered 2023-01-23 (×2): 50 ug via INTRAVENOUS

## 2023-01-23 MED ORDER — ACETAMINOPHEN 10 MG/ML IV SOLN
INTRAVENOUS | Status: DC | PRN
  Administered 2023-01-23: 1000 mg via INTRAVENOUS

## 2023-01-23 MED ORDER — ROCURONIUM BROMIDE 100 MG/10ML IV SOLN
INTRAVENOUS | Status: DC | PRN
  Administered 2023-01-23: 50 mg via INTRAVENOUS
  Administered 2023-01-23: 20 mg via INTRAVENOUS

## 2023-01-23 MED ORDER — VASOPRESSIN 20 UNIT/ML IV SOLN
INTRAVENOUS | Status: DC | PRN
  Administered 2023-01-23: 1 [IU] via INTRAVENOUS
  Administered 2023-01-23 (×2): 2 [IU] via INTRAVENOUS

## 2023-01-23 SURGICAL SUPPLY — 48 items
ANCHOR TIS RET SYS 235ML (MISCELLANEOUS) ×1 IMPLANT
BAG PRESSURE INF REUSE 1000 (BAG) IMPLANT
CATH REDDICK CHOLANGI 4FR 50CM (CATHETERS) IMPLANT
CAUTERY HOOK MNPLR 1.6 DVNC XI (INSTRUMENTS) ×1 IMPLANT
CLIP LIGATING HEMO O LOK GREEN (MISCELLANEOUS) ×1 IMPLANT
DERMABOND ADVANCED .7 DNX12 (GAUZE/BANDAGES/DRESSINGS) ×1 IMPLANT
DRAPE ARM DVNC X/XI (DISPOSABLE) ×4 IMPLANT
DRAPE C-ARM XRAY 36X54 (DRAPES) IMPLANT
DRAPE COLUMN DVNC XI (DISPOSABLE) ×1 IMPLANT
ELECT REM PT RETURN 9FT ADLT (ELECTROSURGICAL) ×1
ELECTRODE REM PT RTRN 9FT ADLT (ELECTROSURGICAL) ×1 IMPLANT
FORCEPS BPLR FENES DVNC XI (FORCEP) ×1 IMPLANT
FORCEPS PROGRASP DVNC XI (FORCEP) ×1 IMPLANT
GLOVE BIOGEL PI IND STRL 7.0 (GLOVE) ×2 IMPLANT
GLOVE SURG SYN 6.5 ES PF (GLOVE) ×2 IMPLANT
GLOVE SURG SYN 6.5 PF PI (GLOVE) ×2 IMPLANT
GOWN STRL REUS W/ TWL LRG LVL3 (GOWN DISPOSABLE) ×3 IMPLANT
GRASPER SUT TROCAR 14GX15 (MISCELLANEOUS) IMPLANT
IRRIGATOR SUCT 8 DISP DVNC XI (IRRIGATION / IRRIGATOR) IMPLANT
IV NS 1000ML BAXH (IV SOLUTION) IMPLANT
KIT TURNOVER KIT A (KITS) ×1 IMPLANT
LABEL OR SOLS (LABEL) ×1 IMPLANT
MANIFOLD NEPTUNE II (INSTRUMENTS) ×1 IMPLANT
NDL HYPO 22X1.5 SAFETY MO (MISCELLANEOUS) ×1 IMPLANT
NDL INSUFFLATION 14GA 120MM (NEEDLE) ×1 IMPLANT
NEEDLE HYPO 22X1.5 SAFETY MO (MISCELLANEOUS) ×1 IMPLANT
NEEDLE INSUFFLATION 14GA 120MM (NEEDLE) ×1 IMPLANT
NS IRRIG 500ML POUR BTL (IV SOLUTION) ×1 IMPLANT
OBTURATOR OPTICAL STND 8 DVNC (TROCAR) ×1
OBTURATOR OPTICALSTD 8 DVNC (TROCAR) ×1 IMPLANT
PACK LAP CHOLECYSTECTOMY (MISCELLANEOUS) ×1 IMPLANT
RELOAD STAPLE 45 3.5 BLU DVNC (STAPLE) IMPLANT
RELOAD STAPLER 3.5X45 BLU DVNC (STAPLE) ×1 IMPLANT
SEAL UNIV 5-12 XI (MISCELLANEOUS) ×4 IMPLANT
SET TUBE SMOKE EVAC HIGH FLOW (TUBING) ×1 IMPLANT
SOL ELECTROSURG ANTI STICK (MISCELLANEOUS) ×1
SOLUTION ELECTROSURG ANTI STCK (MISCELLANEOUS) ×1 IMPLANT
SPIKE FLUID TRANSFER (MISCELLANEOUS) ×2 IMPLANT
STAPLER 45 SUREFORM DVNC (STAPLE) IMPLANT
STAPLER RELOAD 3.5X45 BLU DVNC (STAPLE) ×1
SUT MNCRL 4-0 27 PS-2 XMFL (SUTURE) ×2
SUT MNCRL 4-0 27XMFL (SUTURE) ×2
SUT VICRYL 0 UR6 27IN ABS (SUTURE) ×1 IMPLANT
SUTURE MNCRL 4-0 27XMF (SUTURE) ×2 IMPLANT
SYR 30ML LL (SYRINGE) IMPLANT
SYSTEM WECK SHIELD CLOSURE (TROCAR) IMPLANT
TRAP FLUID SMOKE EVACUATOR (MISCELLANEOUS) ×1 IMPLANT
WATER STERILE IRR 500ML POUR (IV SOLUTION) ×1 IMPLANT

## 2023-01-23 NOTE — Interval H&P Note (Signed)
 History and Physical Interval Note:  01/23/2023 11:48 AM  Blake Rivera  has presented today for surgery, with the diagnosis of Cholecystitis.  The various methods of treatment have been discussed with the patient and family. After consideration of risks, benefits and other options for treatment, the patient has consented to  Procedure(s): XI ROBOTIC ASSISTED LAPAROSCOPIC CHOLECYSTECTOMY (N/A) INDOCYANINE GREEN  FLUORESCENCE IMAGING (ICG) (N/A) as a surgical intervention.  The patient's history has been reviewed, patient examined, no change in status, stable for surgery.  I have reviewed the patient's chart and labs.  Questions were answered to the patient's satisfaction.  Pt ready to proceed   Jaree Dwight Tye

## 2023-01-23 NOTE — Op Note (Signed)
 Preoperative diagnosis: Choledocholithiasis  Postoperative diagnosis: Chronic cholecystitis Procedure: Robotic assisted Laparoscopic Cholecystectomy.   Anesthesia: GETA   Surgeon: Henriette Pierre  Specimen: Gallbladder  Complications: None  EBL: 15mL  Wound Classification: Clean Contaminated  Indications: see HPI  Findings: Critical view of safety not visualized secondary to extremely dense scarring around the triangle of Calot Gallbladder was completely separated from liver bed and noted to be funneling towards the visualized common bile duct using ICG.  Stapler used close to but not involving the common bile duct to transect and remove the gallbladder.  No biliary leak or bleeding was noted at the staple lines.  ICG confirmed common bile duct was completely separate from the staple line.  Description of procedure:  The patient was placed on the operating table in the supine position. SCDs placed, pre-op abx administered.  General anesthesia was induced and OG tube placed by anesthesia. A time-out was completed verifying correct patient, procedure, site, positioning, and implant(s) and/or special equipment prior to beginning this procedure. The abdomen was prepped and draped in the usual sterile fashion.    Veress needle was placed at the Palmer's point and insufflation was started after confirming a positive saline drop test and no immediate increase in abdominal pressure.  After reaching 15 mm, the Veress needle was removed and a 8 mm port was placed via optiview technique under umbilicus measured 20mm from gallbladder.  The abdomen was inspected and no abnormalities or injuries were found.  Under direct vision, ports were placed in the following locations: One 12 mm patient left of the umbilicus, 8cm from the optiviewed port, one 8 mm port placed to the patient right of the umbilical port 8 cm apart.  1 additional 8 mm port placed lateral to the 12mm port.  Once ports were placed, The table  was placed in the reverse Trendelenburg position with the right side up. The Xi platform was brought into the operative field and docked to the ports successfully.  An endoscope was placed through the umbilical port, fenestrated grasper through the adjacent patient right port, prograsp to the far patient left port, and then a hook cautery in the left port.  Transverse colon and omental adhesions from the abdominal wall removed to facilitate instrument exchange.  Additional dense omental adhesions attached to the gallbladder removed to facilitate visualization.  The dome of the gallbladder eventually was grasped with prograsp, passed and retracted over the dome of the liver.  Additional adhesions between the gallbladder and omentum, duodenum and transverse colon were lysed via hook cautery. The infundibulum was grasped with the fenestrated grasper and retracted toward the right lower quadrant. This maneuver exposed Calot's triangle area.  Additional dissection around the area was difficult secondary to extremely dense scar tissue.  Gallbladder then removed off the fossa bed from a top-down approach until it was completely separated from the liver bed.  Critical view of safety still not visualized secondary to extremely dense scarring around the triangle of Calot, not amenable to further dissection. Since gallbladder was completely separated from liver bed and noted to be funneling towards the visualized common bile duct using ICG, 4 to 5 mm blue load stapler used close to but not involving the common bile duct to transect and remove the gallbladder.  No biliary leak or bleeding was noted at the staple lines.  ICG confirmed common bile duct was completely separate from the staple line.   Hemostasis was checked prior to removing the hook cautery and the Endo Catch  bag was then placed through the 12 mm port and the gallbladder was removed.  The gallbladder was passed off the table as a specimen.  Abdomen  desufflated and the 12 mm port site closed with PMI using 0 vicryl under direct vision.  Abdomen desufflated and secondary trocars were removed under direct vision. No bleeding was noted. All skin incisions then closed with subcuticular sutures of 4-0 monocryl and dressed with topical skin adhesive. The orogastric tube was removed and patient extubated.  The patient tolerated the procedure well and was taken to the postanesthesia care unit in stable condition.  All sponge and instrument count correct at end of procedure.

## 2023-01-23 NOTE — Anesthesia Preprocedure Evaluation (Signed)
 Anesthesia Evaluation  Patient identified by MRN, date of birth, ID band Patient awake    Reviewed: Allergy & Precautions, H&P , NPO status , Patient's Chart, lab work & pertinent test results, reviewed documented beta blocker date and time   History of Anesthesia Complications Negative for: history of anesthetic complications  Airway Mallampati: I  TM Distance: >3 FB Neck ROM: full    Dental  (+) Caps, Dental Advidsory Given, Poor Dentition, Missing   Pulmonary neg pulmonary ROS, former smoker   Pulmonary exam normal breath sounds clear to auscultation       Cardiovascular Exercise Tolerance: Good hypertension, (-) angina (-) Past MI and (-) Cardiac Stents Normal cardiovascular exam(-) dysrhythmias (-) Valvular Problems/Murmurs Rhythm:regular Rate:Normal     Neuro/Psych negative neurological ROS  negative psych ROS   GI/Hepatic negative GI ROS, Neg liver ROS,,,  Endo/Other  diabetes, Well Controlled, Type 2, Oral Hypoglycemic Agents    Renal/GU CRFRenal disease  negative genitourinary   Musculoskeletal   Abdominal   Peds  Hematology negative hematology ROS (+)   Anesthesia Other Findings Past Medical History: No date: Diverticulitis of colon No date: ED (erectile dysfunction) No date: Hyperglycemia No date: Hypertension No date: Vitamin D  deficiency   Reproductive/Obstetrics negative OB ROS                             Anesthesia Physical Anesthesia Plan  ASA: 2  Anesthesia Plan: General   Post-op Pain Management:    Induction: Intravenous  PONV Risk Score and Plan: 2 and Ondansetron , Dexamethasone  and Treatment may vary due to age or medical condition  Airway Management Planned: Oral ETT  Additional Equipment:   Intra-op Plan:   Post-operative Plan: Extubation in OR  Informed Consent: I have reviewed the patients History and Physical, chart, labs and discussed the  procedure including the risks, benefits and alternatives for the proposed anesthesia with the patient or authorized representative who has indicated his/her understanding and acceptance.     Dental Advisory Given  Plan Discussed with: Anesthesiologist, CRNA and Surgeon  Anesthesia Plan Comments: (Patient consented for risks of anesthesia including but not limited to:  - adverse reactions to medications - damage to eyes, teeth, lips or other oral mucosa - nerve damage due to positioning  - sore throat or hoarseness - Damage to heart, brain, nerves, lungs, other parts of body or loss of life  Patient voiced understanding and assent.)        Anesthesia Quick Evaluation

## 2023-01-23 NOTE — Transfer of Care (Signed)
 Immediate Anesthesia Transfer of Care Note  Patient: Blake Rivera  Procedure(s) Performed: XI ROBOTIC ASSISTED LAPAROSCOPIC CHOLECYSTECTOMY INDOCYANINE GREEN  FLUORESCENCE IMAGING (ICG)  Patient Location: PACU  Anesthesia Type:General  Level of Consciousness: drowsy and patient cooperative  Airway & Oxygen Therapy: Patient Spontanous Breathing  Post-op Assessment: Report given to RN and Post -op Vital signs reviewed and stable  Post vital signs: stable  Last Vitals:  Vitals Value Taken Time  BP 114/66 01/23/23 1400  Temp    Pulse 64 01/23/23 1402  Resp 10 01/23/23 1402  SpO2 92 % 01/23/23 1402  Vitals shown include unfiled device data.  Last Pain:  Vitals:   01/23/23 1144  TempSrc: Temporal  PainSc: 5       Patients Stated Pain Goal: 0 (01/23/23 1144)  Complications: No notable events documented.

## 2023-01-23 NOTE — Progress Notes (Signed)
 Date of Admission:  01/19/2023     ID: Blake Rivera is a 89 y.o. male Principal Problem:   Bacteremia due to Klebsiella pneumoniae Active Problems:   HTN (hypertension)   Dyslipidemia   Prediabetes   Choledocholithiasis   BPH (benign prostatic hyperplasia)  89 y.o. with a history of Hypertension presented to the ED on 01/19/2023 with sudden onset chills and not feeling well.  He has also had dark-colored urine for the last couple a day.  He had pain to his PCPs office on 01/18/2023 and was prescribed doxycycline  after urine culture was sent.   In the ED vitals of temperature of 99.8, BP 120/76, pulse 72, respiratory rate 18, sats 94%. WBC was 7.4, Hb 14.8, platelet 180, creatinine 1.07. AST 272, ALT 242, total bili 2.2, alkaline phosphatase 311. CT scan showed cholelithiasis with stranding around the gallbladder.  The common bile duct was dilated to 1.3 cm.  There was descending and sigmoid colon diverticulosis with no active diverticulitis There was mild prostatomegaly There was suspected Paget's disease involving the T12 vertebral body. MRI showed cholelithiasis with mild gallbladder wall thickening and trace pericholecystic fluid.  There was choledocholithiasis with intra and extrahepatic biliary duct dilatation. Blood culture was sent and he was started on pip-tazo The culture came back positive for Klebsiella and the patient antibiotic has been changed to ceftriaxone  He was seen by gastroenterologist and taken for ERCP  and underwent Biliary sphincterotomy and balloon extraction of the choledocholithiasis.    Subjective: Had lap cholecystectomy today Sitting in chair and talking on the phone Says he is feeling fine  Medications:   amLODipine   10 mg Oral Daily   atorvastatin   10 mg Oral Daily   enoxaparin  (LOVENOX ) injection  40 mg Subcutaneous Q24H   insulin  aspart  0-15 Units Subcutaneous TID WC   tamsulosin   0.8 mg Oral Daily    Objective: Vital signs in last 24  hours: Patient Vitals for the past 24 hrs:  BP Temp Temp src Pulse Resp SpO2 Height Weight  01/23/23 2023 124/70 97.7 F (36.5 C) Oral (!) 50 20 92 % -- --  01/23/23 1611 (!) 140/74 98.2 F (36.8 C) -- 95 18 93 % -- --  01/23/23 1454 122/75 97.7 F (36.5 C) Oral 88 18 97 % -- --  01/23/23 1432 124/66 -- -- 60 12 97 % -- --  01/23/23 1415 126/70 -- -- 64 13 95 % -- --  01/23/23 1400 114/66 (!) 97.2 F (36.2 C) -- 66 14 95 % -- --  01/23/23 1144 (!) 136/96 98.6 F (37 C) Temporal (!) 54 18 96 % 6' (1.829 m) 96.6 kg  01/23/23 0804 119/69 99.1 F (37.3 C) Oral -- 18 98 % -- --  01/23/23 0346 (!) 149/63 98.2 F (36.8 C) Oral (!) 44 16 98 % -- --     LDA Foley Central lines Other catheters  PHYSICAL EXAM:  General: Alert, cooperative, no distress, appears young for his age.  Head: Normocephalic, without obvious abnormality, atraumatic. Eyes: Conjunctivae clear, anicteric sclerae. Pupils are equal ENT Nares normal. No drainage or sinus tenderness. Lips, mucosa, and tongue normal. No Thrush Neck: Supple, symmetrical, no adenopathy, thyroid : non tender no carotid bruit and no JVD. Back: No CVA tenderness. Lungs: Clear to auscultation bilaterally. No Wheezing or Rhonchi. No rales. Heart: Regular rate and rhythm, no murmur, rub or gallop. Abdomen: Soft, surgical sites-  Bowel sounds normal. No masses Extremities: atraumatic, no cyanosis. No edema. No clubbing  Skin: No rashes or lesions. Or bruising Lymph: Cervical, supraclavicular normal. Neurologic: Grossly non-focal  Lab Results    Latest Ref Rng & Units 01/23/2023    5:03 AM 01/21/2023    6:28 AM 01/20/2023    5:15 AM  CBC  WBC 4.0 - 10.5 K/uL 5.7  9.1  13.3   Hemoglobin 13.0 - 17.0 g/dL 85.6  87.1  86.7   Hematocrit 39.0 - 52.0 % 44.1  38.9  40.9   Platelets 150 - 400 K/uL 194  187  180        Latest Ref Rng & Units 01/23/2023    5:03 AM 01/21/2023    6:28 AM 01/20/2023    5:15 AM  CMP  Glucose 70 - 99 mg/dL 873  91  88    BUN 8 - 23 mg/dL 18  15  15    Creatinine 0.61 - 1.24 mg/dL 9.05  8.94  8.72   Sodium 135 - 145 mmol/L 138  138  141   Potassium 3.5 - 5.1 mmol/L 4.2  3.7  4.5   Chloride 98 - 111 mmol/L 101  104  106   CO2 22 - 32 mmol/L 26  25  27    Calcium  8.9 - 10.3 mg/dL 9.0  8.8  9.0   Total Protein 6.5 - 8.1 g/dL 7.1  6.6  6.9   Total Bilirubin 0.0 - 1.2 mg/dL 0.8  1.1  2.2   Alkaline Phos 38 - 126 U/L 188  222  258   AST 15 - 41 U/L 28  59  143   ALT 0 - 44 U/L 77  129  201       Microbiology: 01/19/23 BC- klebsiella 01/22/23 BC NG     Assessment/Plan: Klebsiella bacteremia  Choledocholithiasis, acute cholecystitis and possible ascending cholangitis Underwent ERCP and removal of biliary stones Had lap cholecystectomy today Patient is currently on ceftriaxone  Repeat blood culture negative so far May change to unasyn if repeat culture remain negative tomorrow May need total of 10 days of antibiotic   Hypertension on amlodipine    Hyperlipidemia on atorvastatin    Diabetes on insulin ? _Sliding scale   BPH on tamsulosin    Discussed the management with the patient,

## 2023-01-23 NOTE — Progress Notes (Addendum)
 Pt back from procedure. Pt aox4, respirations even and unlabored on RA. Pt vitals stable. Surgical incisions visualized at bedside with PACU RN, CDI. Pt tolerating clear liquids and has no complaints at this time

## 2023-01-23 NOTE — Anesthesia Procedure Notes (Signed)
 Procedure Name: Intubation Date/Time: 01/23/2023 12:19 PM  Performed by: Norleen Alberta HERO., CRNAPre-anesthesia Checklist: Patient identified, Patient being monitored, Timeout performed, Emergency Drugs available and Suction available Patient Re-evaluated:Patient Re-evaluated prior to induction Oxygen Delivery Method: Circle system utilized Preoxygenation: Pre-oxygenation with 100% oxygen Induction Type: IV induction Ventilation: Mask ventilation without difficulty Laryngoscope Size: Mac and 4 Grade View: Grade I Tube type: Oral Tube size: 7.5 mm Number of attempts: 1 Airway Equipment and Method: Stylet Placement Confirmation: ETT inserted through vocal cords under direct vision, positive ETCO2 and breath sounds checked- equal and bilateral Secured at: 23 cm Tube secured with: Tape Dental Injury: Teeth and Oropharynx as per pre-operative assessment

## 2023-01-23 NOTE — Progress Notes (Signed)
 Progress Note   Patient: Blake Rivera FMW:[id number] DOB: [birth year] DOA: 01/19/2023     3 DOS: the patient was seen and examined on 01/23/2023   Brief hospital course: 89yo with h/o HTN and HLD who presented with chills, found to have bacteremia with choledocholithiasis and cholecystitis.  Successful ERCP with balloon extraction on 1/6, lap chole on 1/7.  Assessment and Plan:  Klebsiella pneumoniae bacteremia Patient presenting with chills Blood cultures from admission grew Klebsiella pneumoniae, resistant only to Ampicillin ID culture re: duration of treatment Repeating blood cultures to ensure clearance, NTD   Choledocholithiasis Patient presenting with no specific abdominal pain Imaging with CT/MRCP showing cholelithiasis with choledocholithiasis Empiric coverage with Ceftriaxone  GI consulted ERCP 1/6 with cholangiogram showing corresponding filling defect, s/p complete removal of choledocholithiasis by biliary sphincterotomy and balloon extraction Lap chole on 1/7 with hopeful dc to home 1/8 Chole with evidence of chronic cholecystitis with dense scarring   HTN Continue amlodipine    DM Recent A1c was 6.1, good control Hold Glucophage  Cover with moderate-scale SSI    Dyslipidemia Continue atorvastatin    BPH Continue tamsulosin    Abnormal thoracic imaging CT A/P with suspected Paget's disease of T12 vertebral body as well as R foraminal impingement due to facet spurring Outpatient neurosurgery/orthopedics f/u encouraged       Consultants: GI Surgery ID   Procedures: ERCP 1/6 Cholecystectomy 1/7   Antibiotics: Ceftriaxone  1/4- Zosyn  x 1    30 Day Unplanned Readmission Risk Score    Flowsheet Row ED to Hosp-Admission (Current) from 01/19/2023 in [facility]  30 Day Unplanned Readmission Risk Score (%) 8.88 Filed at 01/23/2023 0401       This score is the patient's risk of an unplanned readmission within 30 days  of being discharged (0 -100%). The score is based on dignosis, age, lab data, medications, orders, and past utilization.   Low:  0-14.9   Medium: 15-21.9   High: 22-29.9   Extreme: 30 and above           Subjective: Feeling great, excited about surgery and then home.   Objective: Vitals:   01/23/23 1454 01/23/23 1611  BP: 122/75 (!) 140/74  Pulse: 88 95  Resp: 18 18  Temp: 97.7 F (36.5 C) 98.2 F (36.8 C)  SpO2: 97% 93%    Intake/Output Summary (Last 24 hours) at 01/23/2023 1621 Last data filed at 01/23/2023 1436 Gross per 24 hour  Intake 940 ml  Output --  Net 940 ml   Filed Weights   01/19/23 1414 01/23/23 1144  Weight: 98 kg 96.6 kg    Exam:  General:  Appears calm and comfortable and is in NAD Eyes:  PERRL, EOMI, normal lids, iris ENT:  grossly normal hearing, lips & tongue, mmm Neck:  no LAD, masses or thyromegaly Cardiovascular:  RRR, no m/r/g. No LE edema.  Respiratory:   CTA bilaterally with no wheezes/rales/rhonchi.  Normal respiratory effort. Abdomen:  soft, mild TTP in epigastric region and RUQ Skin:  no rash or induration seen on limited exam Musculoskeletal:  grossly normal tone BUE/BLE, good ROM, no bony abnormality Psychiatric:  grossly normal mood and affect, speech fluent and appropriate, AOx3 Neurologic:  CN 2-12 grossly intact, moves all extremities in coordinated fashion  Data Reviewed: I have reviewed the patient's lab results since admission.  Pertinent labs for today include:   Glucose 126 AP 188, improved AST 28/ALT 77, improved Normal CBC Repeat blood cultures NTD    Family Communication:  None present  Disposition: Status is: Inpatient Remains inpatient appropriate because: surgery today     Time spent: 35 minutes  Unresulted Labs (From admission, onward)     Start     Ordered   01/24/23 0500  CBC  Daily,   R      01/23/23 1511   01/24/23 0500  Basic metabolic panel  Daily,   R      01/23/23 1511              Author: Delon Herald, MD 01/23/2023 4:21 PM  For on call review [email or web address].

## 2023-01-24 ENCOUNTER — Other Ambulatory Visit: Payer: Self-pay | Admitting: Internal Medicine

## 2023-01-24 DIAGNOSIS — I10 Essential (primary) hypertension: Secondary | ICD-10-CM

## 2023-01-24 DIAGNOSIS — R7881 Bacteremia: Secondary | ICD-10-CM | POA: Diagnosis not present

## 2023-01-24 DIAGNOSIS — B961 Klebsiella pneumoniae [K. pneumoniae] as the cause of diseases classified elsewhere: Secondary | ICD-10-CM | POA: Diagnosis not present

## 2023-01-24 LAB — CBC
HCT: 40.4 % (ref 39.0–52.0)
Hemoglobin: 13.3 g/dL (ref 13.0–17.0)
MCH: 27.9 pg (ref 26.0–34.0)
MCHC: 32.9 g/dL (ref 30.0–36.0)
MCV: 84.7 fL (ref 80.0–100.0)
Platelets: 200 10*3/uL (ref 150–400)
RBC: 4.77 MIL/uL (ref 4.22–5.81)
RDW: 14.4 % (ref 11.5–15.5)
WBC: 11.3 10*3/uL — ABNORMAL HIGH (ref 4.0–10.5)
nRBC: 0 % (ref 0.0–0.2)

## 2023-01-24 LAB — BASIC METABOLIC PANEL
Anion gap: 9 (ref 5–15)
BUN: 19 mg/dL (ref 8–23)
CO2: 27 mmol/L (ref 22–32)
Calcium: 8.8 mg/dL — ABNORMAL LOW (ref 8.9–10.3)
Chloride: 105 mmol/L (ref 98–111)
Creatinine, Ser: 1.05 mg/dL (ref 0.61–1.24)
GFR, Estimated: >60 mL/min (ref >60)
Glucose, Bld: 88 mg/dL (ref 70–99)
Potassium: 4.2 mmol/L (ref 3.5–5.1)
Sodium: 141 mmol/L (ref 135–145)

## 2023-01-24 LAB — GLUCOSE, CAPILLARY
Glucose-Capillary: 78 mg/dL (ref 70–99)
Glucose-Capillary: 101 mg/dL — ABNORMAL HIGH (ref 70–99)

## 2023-01-24 LAB — SURGICAL PATHOLOGY

## 2023-01-24 MED ORDER — ACETAMINOPHEN 325 MG PO TABS: 325.0000 mg | ORAL_TABLET | Freq: Three times a day (TID) | ORAL | 0 refills | Status: AC | PRN

## 2023-01-24 MED ORDER — AMOXICILLIN-POT CLAVULANATE 875-125 MG PO TABS: 1.0000 | ORAL_TABLET | Freq: Two times a day (BID) | ORAL | 0 refills | Status: AC

## 2023-01-24 MED ORDER — IBUPROFEN 400 MG PO TABS: 400.0000 mg | ORAL_TABLET | Freq: Three times a day (TID) | ORAL | 0 refills | Status: AC | PRN

## 2023-01-24 MED ORDER — OXYCODONE-ACETAMINOPHEN 5-325 MG PO TABS: 1.0000 | ORAL_TABLET | Freq: Three times a day (TID) | ORAL | 0 refills | Status: DC | PRN

## 2023-01-24 MED ORDER — DOCUSATE SODIUM 100 MG PO CAPS: 100.0000 mg | ORAL_CAPSULE | Freq: Two times a day (BID) | ORAL | 0 refills | Status: AC | PRN

## 2023-01-24 NOTE — Discharge Instructions (Signed)
 Laparoscopic Cholecystectomy, Care After This sheet gives you information about how to care for yourself after your procedure. Your doctor may also give you more specific instructions. If you have problems or questions, contact your doctor. Follow these instructions at home: Care for cuts from surgery (incisions)  Follow instructions from your doctor about how to take care of your cuts from surgery. Make sure you: Wash your hands with soap and water before you change your bandage (dressing). If you cannot use soap and water, use hand sanitizer. Change your bandage as told by your doctor. Leave stitches (sutures), skin glue, or skin tape (adhesive) strips in place. They may need to stay in place for 2 weeks or longer. If tape strips get loose and curl up, you may trim the loose edges. Do not remove tape strips completely unless your doctor says it is okay. Do not take baths, swim, or use a hot tub until your doctor says it is okay. OK TO SHOWER 24HRS AFTER YOUR SURGERY.  Check your surgical cut area every day for signs of infection. Check for: More redness, swelling, or pain. More fluid or blood. Warmth. Pus or a bad smell. Activity Do not drive or use heavy machinery while taking prescription pain medicine. Do not play contact sports until your doctor says it is okay. Do not drive for 24 hours if you were given a medicine to help you relax (sedative). Rest as needed. Do not return to work or school until your doctor says it is okay. General instructions  tylenol and advil as needed for discomfort.  Please alternate between the two every four hours as needed for pain.    Use narcotics, if prescribed, only when tylenol and motrin is not enough to control pain.  325-650mg  every 8hrs to max of 3000mg /24hrs (including the 325mg  in every norco dose) for the tylenol.    Advil up to 800mg  per dose every 8hrs as needed for pain.   To prevent or treat constipation while you are taking prescription  pain medicine, your doctor may recommend that you: Drink enough fluid to keep your pee (urine) clear or pale yellow. Take over-the-counter or prescription medicines. Eat foods that are high in fiber, such as fresh fruits and vegetables, whole grains, and beans. Limit foods that are high in fat and processed sugars, such as fried and sweet foods. Contact a doctor if: You develop a rash. You have more redness, swelling, or pain around your surgical cuts. You have more fluid or blood coming from your surgical cuts. Your surgical cuts feel warm to the touch. You have pus or a bad smell coming from your surgical cuts. You have a fever. One or more of your surgical cuts breaks open. You have trouble breathing. You have chest pain. You have pain that is getting worse in your shoulders. You faint or feel dizzy when you stand. You have very bad pain in your belly (abdomen). You are sick to your stomach (nauseous) for more than one day. You have throwing up (vomiting) that lasts for more than one day. You have leg pain. This information is not intended to replace advice given to you by your health care provider. Make sure you discuss any questions you have with your health care provider. Document Released: 10/12/2007 Document Revised: 07/24/2015 Document Reviewed: 06/21/2015 Elsevier Interactive Patient Education  2019 [employer].

## 2023-01-24 NOTE — TOC CM/SW Note (Signed)
 Transition of Care [facility]) - Inpatient Brief Assessment   Patient Details  Name: Blake Rivera MRN: [id number] Date of Birth: [birth year]  Transition of Care [facility]) CM/SW Contact:    Corean ONEIDA Haddock, RN Phone Number: 01/24/2023, 2:06 PM   Clinical Narrative:  Transition of Care [facility]) Screening Note   Patient Details  Name: Blake Rivera Date of Birth: [birth year]   Transition of Care [facility]) CM/SW Contact:    Corean ONEIDA Haddock, RN Phone Number: 01/24/2023, 2:06 PM    Transition of Care Department [facility]) has reviewed patient and no TOC needs have been identified at this time.  If new patient transition needs arise, please place a TOC consult.     Transition of Care Asessment: Insurance and Status: Insurance coverage has been reviewed Patient has primary care physician: Yes     Prior/Current Home Services: No current home services Social Drivers of Health Review: SDOH reviewed no interventions necessary Readmission risk has been reviewed: Yes Transition of care needs: no transition of care needs at this time

## 2023-01-24 NOTE — Progress Notes (Signed)
 Subjective:  CC: Blake Rivera is a 89 y.o. male  Hospital stay day 4, 1 Day Post-Op chronic cholecystits  HPI: Sore around incision sites  ROS:  General: Denies weight loss, weight gain, fatigue, fevers, chills, and night sweats. Heart: Denies chest pain, palpitations, racing heart, irregular heartbeat, leg pain or swelling, and decreased activity tolerance. Respiratory: Denies breathing difficulty, shortness of breath, wheezing, cough, and sputum. GI: Denies change in appetite, heartburn, nausea, vomiting, constipation, diarrhea, and blood in stool. GU: Denies difficulty urinating, pain with urinating, urgency, frequency, blood in urine.   Objective:   Temp:  [97.2 F (36.2 C)-98.6 F (37 C)] 98.6 F (37 C) (01/08 0745) Pulse Rate:  [47-95] 50 (01/08 0745) Resp:  [12-20] 16 (01/08 0745) BP: (114-140)/(57-96) 129/84 (01/08 0745) SpO2:  [92 %-97 %] 97 % (01/08 0745) Weight:  [96.6 kg] 96.6 kg (01/07 1144)     Height: 6' (182.9 cm) Weight: 96.6 kg BMI (Calculated): 28.88   Intake/Output this shift:   Intake/Output Summary (Last 24 hours) at 01/24/2023 1002 Last data filed at 01/23/2023 2100 Gross per 24 hour  Intake 1180 ml  Output --  Net 1180 ml    Constitutional :  alert, cooperative, appears stated age, and no distress  Respiratory:  clear to auscultation bilaterally  Cardiovascular:  regular rate and rhythm  Gastrointestinal: Soft, no guarding, TTP as expected around incisions .   Skin: Cool and moist. Incisions c/d/i  Psychiatric: Normal affect, non-agitated, not confused       LABS:     Latest Ref Rng & Units 01/24/2023    5:16 AM 01/23/2023    5:03 AM 01/21/2023    6:28 AM  CMP  Glucose 70 - 99 mg/dL 88  873  91   BUN 8 - 23 mg/dL 19  18  15    Creatinine 0.61 - 1.24 mg/dL 8.94  9.05  8.94   Sodium 135 - 145 mmol/L 141  138  138   Potassium 3.5 - 5.1 mmol/L 4.2  4.2  3.7   Chloride 98 - 111 mmol/L 105  101  104   CO2 22 - 32 mmol/L 27  26  25    Calcium  8.9 -  10.3 mg/dL 8.8  9.0  8.8   Total Protein 6.5 - 8.1 g/dL  7.1  6.6   Total Bilirubin 0.0 - 1.2 mg/dL  0.8  1.1   Alkaline Phos 38 - 126 U/L  188  222   AST 15 - 41 U/L  28  59   ALT 0 - 44 U/L  77  129       Latest Ref Rng & Units 01/24/2023    5:16 AM 01/23/2023    5:03 AM 01/21/2023    6:28 AM  CBC  WBC 4.0 - 10.5 K/uL 11.3  5.7  9.1   Hemoglobin 13.0 - 17.0 g/dL 86.6  85.6  87.1   Hematocrit 39.0 - 52.0 % 40.4  44.1  38.9   Platelets 150 - 400 K/uL 200  194  187     RADS: N/a Assessment:   S/p robotic lap chole for chronic cholecystitis. Doing well.  Advance to reg diet.  Abx per ID for bacteremia.  Ok to d/c from surgery standpoint if tolerates breakfast and ok to go home on oral abx  labs/images/medications/previous chart entries reviewed personally and relevant changes/updates noted above.

## 2023-01-24 NOTE — Care Management Important Message (Signed)
 Important Message  Patient Details  Name: Blake Rivera MRN: [id number] Date of Birth: 11/29/1934   Important Message Given:  Yes - Medicare IM     Sherilyn Banker 01/24/2023, 12:14 PM

## 2023-01-24 NOTE — Progress Notes (Signed)
 Klebsiella bacteremia, choledocholithiasis, cholecystitis S/p ERPC S/p cholecystectomy After IV antibiotic  Pt has been discharged on Augmentin for 7 more days

## 2023-01-24 NOTE — Plan of Care (Signed)
   Problem: Education: Goal: Knowledge of General Education information will improve Description Including pain rating scale, medication(s)/side effects and non-pharmacologic comfort measures Outcome: Progressing

## 2023-01-24 NOTE — Discharge Summary (Signed)
 Physician Discharge Summary  Blake Rivera FMW:[id number] DOB: 1934-03-06 DOA: 01/19/2023  PCP: [name], DO  Admit date: 01/19/2023 Discharge date: 01/24/2023  Admitted From: home  Disposition:  home   Recommendations for Outpatient Follow-up:  Follow up with PCP in 1-2 weeks F/u w/ gen surg, Dr. Tye, in 1-2 weeks    Home Health: no  Equipment/Devices:  Discharge Condition: stable  CODE STATUS: full  Diet recommendation: Regular  Brief/Interim Summary: HPI was taken from Dr. Laveda: This is a 89 year old male with past medical history significant for CKD stage II, HLD, HTN.  Today he had severe chills that would not resolve.  He just could not get warm.  He denies fever, nausea, vomiting or diarrhea.  He reports some mild abdominal discomfort starting around Thanksgiving but nothing significant.  Today he has no abdominal discomfort.   In the ER vital stable Tmax 100.2.  Labs lactic acid 2.6 => 1.6.  Alk phos 311, AST 272, ALT 243, T. bili 1.3 [all previously normal], lipase 29.  Respiratory panel negative, UA negative.  MRCP consistent with choledocholithiasis.  CT abdomen and pelvis questions acute cholecystitis.  Blood cultures x 2 collected.  Patient given IV Zosyn .  GI on-call contacted.  Will see patient in a.m.    Discharge Diagnoses:  Principal Problem:   Bacteremia due to Klebsiella pneumoniae Active Problems:   HTN (hypertension)   Dyslipidemia   Prediabetes   Choledocholithiasis   BPH (benign prostatic hyperplasia)  Klebsiella pneumoniae bacteremia: continue on IV rocephin  and d/c home w/ augmentin  x 1 week as per ID. Repeat blood cxs NGTD.    Choledocholithiasis: as per MRCP. Continue on IV rocephin . S/p ERCP 1/6 with cholangiogram showing corresponding filling defect, s/p complete removal of choledocholithiasis by biliary sphincterotomy and balloon extraction.   Chronic cholecystitis: with dense scarring. S/p lap chole on 1/7 as per gen surg. F/u w/  gen surg in 2 weeks    HTN: continue on amlodipine     DM2: well controlled, HbA1c 6.1. Continue on anti-DM meds    HLD: continue on statin    BPH: continue on tamsulosin     Abnormal thoracic imaging: CT A/P with suspected Paget's disease of T12 vertebral body as well as R foraminal impingement due to facet spurring. Outpatient neurosurgery/orthopedics f/u encouraged  Discharge Instructions  Discharge Instructions     Diet general   Complete by: As directed    Discharge instructions   Complete by: As directed    F/u w/ PCP in 1-2 weeks. F/u gen surg, Dr. Tye, in 2 weeks.   Increase activity slowly   Complete by: As directed       Allergies as of 01/24/2023       Reactions   Lisinopril    Angioedema   Omacor  [omega-3-acid Ethyl Esters (fish)]    Other reaction(s): Rash   Zocor  [simvastatin]    Muscle Pain        Medication List     STOP taking these medications    doxycycline  100 MG tablet Commonly known as: ADOXA       TAKE these medications    acetaminophen  325 MG tablet Commonly known as: Tylenol  Take 1 tablet (325 mg total) by mouth every 8 (eight) hours as needed for mild pain (pain score 1-3).   amLODipine  10 MG tablet Commonly known as: NORVASC  Take 1 tablet (10 mg total) by mouth daily.   amoxicillin -clavulanate 875-125 MG tablet Commonly known as: AUGMENTIN  Take 1 tablet by  mouth 2 (two) times daily for 7 days.   atorvastatin  10 MG tablet Commonly known as: LIPITOR Take 1 tablet (10 mg total) by mouth daily.   docusate sodium  100 MG capsule Commonly known as: Colace Take 1 capsule (100 mg total) by mouth 2 (two) times daily as needed for up to 10 days for mild constipation.   ibuprofen  400 MG tablet Commonly known as: ADVIL  Take 1 tablet (400 mg total) by mouth every 8 (eight) hours as needed for mild pain (pain score 1-3) or moderate pain (pain score 4-6).   metFORMIN  500 MG tablet Commonly known as: GLUCOPHAGE  Take 500 mg by  mouth daily with breakfast.   oxyCODONE -acetaminophen  5-325 MG tablet Commonly known as: Percocet Take 1 tablet by mouth every 8 (eight) hours as needed for up to 5 days for severe pain (pain score 7-10).   tamsulosin  0.4 MG Caps capsule Commonly known as: FLOMAX  Take 2 capsules (0.8 mg total) by mouth daily.        Follow-up Information     Tye, Isami, DO Follow up in 2 week(s).   Specialties: General Surgery, Surgery Why: post op lap chole Contact information: [address] [place] [address] [phone]                Allergies  Allergen Reactions   Lisinopril     Angioedema   Omacor  [Omega-3-Acid Ethyl Esters (Fish)]     Other reaction(s): Rash   Zocor  [Simvastatin]     Muscle Pain    Consultations: Gen surg ID GI    Procedures/Studies: DG C-Arm 1-60 Min-No Report Result Date: 01/22/2023 Fluoroscopy was utilized by the requesting physician.  No radiographic interpretation.   MR ABDOMEN MRCP W WO CONTAST Result Date: 01/19/2023 CLINICAL DATA:  Right upper quadrant abdominal pain EXAM: MRI ABDOMEN WITHOUT AND WITH CONTRAST (INCLUDING MRCP) TECHNIQUE: Multiplanar multisequence MR imaging of the abdomen was performed both before and after the administration of intravenous contrast. Heavily T2-weighted images of the biliary and pancreatic ducts were obtained, and three-dimensional MRCP images were rendered by post processing. CONTRAST:  10mL GADAVIST  GADOBUTROL  1 MMOL/ML IV SOLN COMPARISON:  CT abdomen pelvis, 01/19/2023 FINDINGS: Lower chest: No acute abnormality. Hepatobiliary: No solid liver abnormality is seen. Numerous small gallstones contracted in the gallbladder. Mild gallbladder wall thickening and trace pericholecystic fluid. Intra and extrahepatic biliary ductal dilatation, the common bile duct measuring up to 1.2 cm in caliber. At least three small gallstones in the common bile duct within the pancreatic head, including the largest at the  ampulla measuring 0.7 cm (series 3, image 17, series 8, image 27). Pancreas: Unremarkable. No pancreatic ductal dilatation or surrounding inflammatory changes. Spleen: Normal in size without significant abnormality. Adrenals/Urinary Tract: Adrenal glands are unremarkable. Simple, benign left renal cortical cysts, for which no further follow-up or characterization is required. Kidneys are otherwise normal, without obvious renal calculi, solid lesion, or hydronephrosis. Stomach/Bowel: Stomach is within normal limits. No evidence of bowel wall thickening, distention, or inflammatory changes. Descending and sigmoid diverticulosis. Vascular/Lymphatic: No significant vascular findings are present. No enlarged abdominal lymph nodes. Other: No abdominal wall hernia or abnormality. No ascites. Musculoskeletal: No acute or significant osseous findings. IMPRESSION: 1. Cholelithiasis with mild gallbladder wall thickening and trace pericholecystic fluid. 2. Choledocholithiasis with intra and extrahepatic biliary ductal dilatation. At least three small gallstones in the common bile duct within the pancreatic head, including the largest at the ampulla measuring 0.7 cm. 3. Descending and sigmoid diverticulosis. Preliminary communication  of findings by Dr. Scott to Dr. Suzanne, 10:30 PM 01/19/2023. Electronically Signed   By: Marolyn JONETTA Jaksch M.D.   On: 01/19/2023 23:00   MR 3D Recon At Scanner Result Date: 01/19/2023 CLINICAL DATA:  Right upper quadrant abdominal pain EXAM: MRI ABDOMEN WITHOUT AND WITH CONTRAST (INCLUDING MRCP) TECHNIQUE: Multiplanar multisequence MR imaging of the abdomen was performed both before and after the administration of intravenous contrast. Heavily T2-weighted images of the biliary and pancreatic ducts were obtained, and three-dimensional MRCP images were rendered by post processing. CONTRAST:  10mL GADAVIST  GADOBUTROL  1 MMOL/ML IV SOLN COMPARISON:  CT abdomen pelvis, 01/19/2023 FINDINGS: Lower chest: No  acute abnormality. Hepatobiliary: No solid liver abnormality is seen. Numerous small gallstones contracted in the gallbladder. Mild gallbladder wall thickening and trace pericholecystic fluid. Intra and extrahepatic biliary ductal dilatation, the common bile duct measuring up to 1.2 cm in caliber. At least three small gallstones in the common bile duct within the pancreatic head, including the largest at the ampulla measuring 0.7 cm (series 3, image 17, series 8, image 27). Pancreas: Unremarkable. No pancreatic ductal dilatation or surrounding inflammatory changes. Spleen: Normal in size without significant abnormality. Adrenals/Urinary Tract: Adrenal glands are unremarkable. Simple, benign left renal cortical cysts, for which no further follow-up or characterization is required. Kidneys are otherwise normal, without obvious renal calculi, solid lesion, or hydronephrosis. Stomach/Bowel: Stomach is within normal limits. No evidence of bowel wall thickening, distention, or inflammatory changes. Descending and sigmoid diverticulosis. Vascular/Lymphatic: No significant vascular findings are present. No enlarged abdominal lymph nodes. Other: No abdominal wall hernia or abnormality. No ascites. Musculoskeletal: No acute or significant osseous findings. IMPRESSION: 1. Cholelithiasis with mild gallbladder wall thickening and trace pericholecystic fluid. 2. Choledocholithiasis with intra and extrahepatic biliary ductal dilatation. At least three small gallstones in the common bile duct within the pancreatic head, including the largest at the ampulla measuring 0.7 cm. 3. Descending and sigmoid diverticulosis. Preliminary communication of findings by Dr. Scott to Dr. Suzanne, 10:30 PM 01/19/2023. Electronically Signed   By: Marolyn JONETTA Jaksch M.D.   On: 01/19/2023 23:00   CT ABDOMEN PELVIS WO CONTRAST Result Date: 01/19/2023 CLINICAL DATA:  Chills and shaking.  Abdominal/flank pain. EXAM: CT ABDOMEN AND PELVIS WITHOUT CONTRAST  TECHNIQUE: Multidetector CT imaging of the abdomen and pelvis was performed following the standard protocol without IV contrast. RADIATION DOSE REDUCTION: This exam was performed according to the departmental dose-optimization program which includes automated exposure control, adjustment of the mA and/or kV according to patient size and/or use of iterative reconstruction technique. COMPARISON:  None Available. FINDINGS: Lower chest: Unremarkable Hepatobiliary: Calcifications and heterogeneous density in the gallbladder, most likely representing gallstones. Potential mild stranding around the gallbladder. Common bile duct about 1.3 cm in diameter, mildly abnormally dilated. No directly visualized choledocholithiasis although choledocholithiasis is often occult on CT. Pancreas: Unremarkable Spleen: Unremarkable Adrenals/Urinary Tract: Fullness of both adrenal glands without discrete mass. Fluid density lesions of the left kidney favoring cysts, largest 1.6 cm in diameter. Symmetric bilateral perirenal stranding. No urinary tract calculi are identified. No urinary bladder wall thickening. No hydronephrosis or hydroureter. Stomach/Bowel: Transverse duodenal diverticulum without findings of inflammation. Normal appendix. Descending and sigmoid colon diverticulosis, no findings of active diverticulitis. No dilated bowel. There are some scattered small noninflamed diverticula of the terminal ileum. Vascular/Lymphatic: Mild aortoiliac atheromatous vascular calcification. Reproductive: Mild prostatomegaly. Other: No supplemental non-categorized findings. Musculoskeletal: Lucency and trabecular thickening throughout the T12 vertebral body and posterior elements, suspicious for Paget's disease of bone given the  diffuse vertebral involvement. Suspected right foraminal impingement at T11-12 due to facet spurring. Congenitally short pedicles along with spondylosis and degenerative disc disease cause multilevel impingement in the  lower lumbar spine. Degenerative arthropathy of both hips. IMPRESSION: 1. Cholelithiasis. Potential mild stranding around the gallbladder. Common bile duct about 1.3 cm in diameter, mildly abnormally dilated. No directly visualized choledocholithiasis although choledocholithiasis is often occult on CT. Correlate with bilirubin levels and other indicators in assessing for potential biliary obstruction or acute cholecystitis. 2. Descending and sigmoid colon diverticulosis, no findings of active diverticulitis. 3. Mild prostatomegaly. 4. 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 5. Right foraminal impingement at T11-12 due to facet spurring. 6. Congenitally short pedicles along with spondylosis and degenerative disc disease cause multilevel impingement in the lower lumbar spine. 7. Degenerative arthropathy of both hips. 8. Mild aortoiliac atheromatous vascular calcification. Aortic Atherosclerosis (ICD10-I70.0). Electronically Signed   By: Ryan Salvage M.D.   On: 01/19/2023 18:40   DG Chest 2 View Result Date: 01/19/2023 CLINICAL DATA:  Cough EXAM: CHEST - 2 VIEW COMPARISON:  Report 03/29/2001 FINDINGS: No acute airspace disease or effusion. Normal cardiac size. No pneumothorax. IMPRESSION: No active cardiopulmonary disease. Electronically Signed   By: Luke Bun M.D.   On: 01/19/2023 17:16   (Echo, Carotid, EGD, Colonoscopy, ERCP)    Subjective: Pt c/o fatigue    Discharge Exam: Vitals:   01/24/23 0453 01/24/23 0745  BP: (!) 137/57 129/84  Pulse: (!) 47 (!) 50  Resp: 18 16  Temp: 98.5 F (36.9 C) 98.6 F (37 C)  SpO2: 95% 97%   Vitals:   01/23/23 1611 01/23/23 2023 01/24/23 0453 01/24/23 0745  BP: (!) 140/74 124/70 (!) 137/57 129/84  Pulse: 95 (!) 50 (!) 47 (!) 50  Resp: 18 20 18 16   Temp: 98.2 F (36.8 C) 97.7 F (36.5 C) 98.5 F (36.9 C) 98.6 F (37 C)  TempSrc:  Oral Oral Oral  SpO2: 93% 92% 95% 97%  Weight:      Height:        General: Pt is alert,  awake, not in acute distress Cardiovascular: S1/S2 +, no rubs, no gallops Respiratory: CTA bilaterally, no wheezing, no rhonchi Abdominal: Soft, NT, ND, bowel sounds + Extremities: no edema, no cyanosis    The results of significant diagnostics from this hospitalization (including imaging, microbiology, ancillary and laboratory) are listed below for reference.     Microbiology: Recent Results (from the past 240 hours)  CULTURE, URINE COMPREHENSIVE     Status: Abnormal   Collection Time: 01/18/23 10:19 AM   Specimen: Urine  Result Value Ref Range Status   MICRO NUMBER: [id number]  Final   SPECIMEN QUALITY: Adequate  Final   Source OTHER (SPECIFY)  Final   STATUS: FINAL  Final   ISOLATE 1: Gram positive cocci isolated (A)  Final    Comment: 1,000-9,000 CFU/ML of Gram positive cocci isolated May represent colonizers from external and internal genitalia. No further testing (including susceptibility) will be performed.  Resp panel by RT-PCR (RSV, Flu A&B, Covid) Anterior Nasal Swab     Status: None   Collection Time: 01/19/23  2:16 PM   Specimen: Anterior Nasal Swab  Result Value Ref Range Status   SARS Coronavirus 2 by RT PCR NEGATIVE NEGATIVE Final    Comment: (NOTE) SARS-CoV-2 target nucleic acids are NOT DETECTED.  The SARS-CoV-2 RNA is generally detectable in upper respiratory specimens during the acute phase of infection. The lowest concentration of SARS-CoV-2 viral  copies this assay can detect is 138 copies/mL. A negative result does not preclude SARS-Cov-2 infection and should not be used as the sole basis for treatment or other patient management decisions. A negative result may occur with  improper specimen collection/handling, submission of specimen other than nasopharyngeal swab, presence of viral mutation(s) within the areas targeted by this assay, and inadequate number of viral copies(<138 copies/mL). A negative result must be combined with clinical observations,  patient history, and epidemiological information. The expected result is Negative.  Fact Sheet for Patients:  bloggercourse.com  Fact Sheet for Healthcare Providers:  seriousbroker.it  This test is no t yet approved or cleared by the [place]  FDA and  has been authorized for detection and/or diagnosis of SARS-CoV-2 by FDA under an Emergency Use Authorization (EUA). This EUA will remain  in effect (meaning this test can be used) for the duration of the COVID-19 declaration under Section 564(b)(1) of the Act, 21 U.S.C.section 360bbb-3(b)(1), unless the authorization is terminated  or revoked sooner.       Influenza A by PCR NEGATIVE NEGATIVE Final   Influenza B by PCR NEGATIVE NEGATIVE Final    Comment: (NOTE) The Xpert Xpress SARS-CoV-2/FLU/RSV plus assay is intended as an aid in the diagnosis of influenza from Nasopharyngeal swab specimens and should not be used as a sole basis for treatment. Nasal washings and aspirates are unacceptable for Xpert Xpress SARS-CoV-2/FLU/RSV testing.  Fact Sheet for Patients: bloggercourse.com  Fact Sheet for Healthcare Providers: seriousbroker.it  This test is not yet approved or cleared by the [place]  FDA and has been authorized for detection and/or diagnosis of SARS-CoV-2 by FDA under an Emergency Use Authorization (EUA). This EUA will remain in effect (meaning this test can be used) for the duration of the COVID-19 declaration under Section 564(b)(1) of the Act, 21 U.S.C. section 360bbb-3(b)(1), unless the authorization is terminated or revoked.     Resp Syncytial Virus by PCR NEGATIVE NEGATIVE Final    Comment: (NOTE) Fact Sheet for Patients: bloggercourse.com  Fact Sheet for Healthcare Providers: seriousbroker.it  This test is not yet approved or cleared by the [place]  [place] FDA and has been authorized for detection and/or diagnosis of SARS-CoV-2 by FDA under an Emergency Use Authorization (EUA). This EUA will remain in effect (meaning this test can be used) for the duration of the COVID-19 declaration under Section 564(b)(1) of the Act, 21 U.S.C. section 360bbb-3(b)(1), unless the authorization is terminated or revoked.  Performed at [facility], [address] [place], [place] [address]   Blood culture (single)     Status: Abnormal   Collection Time: 01/19/23  3:24 PM   Specimen: BLOOD  Result Value Ref Range Status   Specimen Description   Final    BLOOD BLOOD RIGHT FOREARM Performed at [facility], [address] [place], [place] [address]    Special Requests   Final    BOTTLES DRAWN AEROBIC AND ANAEROBIC Blood Culture results may not be optimal due to an inadequate volume of blood received in culture bottles Performed at [facility], [address], [place], [place] [address]    Culture  Setup Time   Final    GRAM NEGATIVE RODS IN BOTH AEROBIC AND ANAEROBIC BOTTLES CRITICAL RESULT CALLED TO, READ BACK BY AND VERIFIED WITH: JASON ROBBBINS @ 0458 01/20/23 [facility]    Culture KLEBSIELLA PNEUMONIAE (A)  Final   Report Status 01/22/2023 FINAL  Final   Organism ID, Bacteria KLEBSIELLA PNEUMONIAE  Final  Organism ID, Bacteria KLEBSIELLA PNEUMONIAE  Final      Susceptibility   Klebsiella pneumoniae - KIRBY BAUER*    CEFAZOLIN  SENSITIVE Sensitive    Klebsiella pneumoniae - MIC*    AMPICILLIN RESISTANT Resistant     CEFEPIME <=0.12 SENSITIVE Sensitive     CEFTAZIDIME <=1 SENSITIVE Sensitive     CEFTRIAXONE  <=0.25 SENSITIVE Sensitive     CIPROFLOXACIN <=0.25 SENSITIVE Sensitive     GENTAMICIN  <=1 SENSITIVE Sensitive     IMIPENEM <=0.25 SENSITIVE Sensitive     TRIMETH /SULFA  <=20 SENSITIVE Sensitive     AMPICILLIN/SULBACTAM <=2 SENSITIVE Sensitive     PIP/TAZO <=4 SENSITIVE Sensitive ug/mL    * KLEBSIELLA  PNEUMONIAE    KLEBSIELLA PNEUMONIAE  Blood Culture ID Panel (Reflexed)     Status: Abnormal   Collection Time: 01/19/23  3:24 PM  Result Value Ref Range Status   Enterococcus faecalis NOT DETECTED NOT DETECTED Final   Enterococcus Faecium NOT DETECTED NOT DETECTED Final   Listeria monocytogenes NOT DETECTED NOT DETECTED Final   Staphylococcus species NOT DETECTED NOT DETECTED Final   Staphylococcus aureus (BCID) NOT DETECTED NOT DETECTED Final   Staphylococcus epidermidis NOT DETECTED NOT DETECTED Final   Staphylococcus lugdunensis NOT DETECTED NOT DETECTED Final   Streptococcus species NOT DETECTED NOT DETECTED Final   Streptococcus agalactiae NOT DETECTED NOT DETECTED Final   Streptococcus pneumoniae NOT DETECTED NOT DETECTED Final   Streptococcus pyogenes NOT DETECTED NOT DETECTED Final   A.calcoaceticus-baumannii NOT DETECTED NOT DETECTED Final   Bacteroides fragilis NOT DETECTED NOT DETECTED Final   Enterobacterales DETECTED (A) NOT DETECTED Final    Comment: Enterobacterales represent a large order of gram negative bacteria, not a single organism. CRITICAL RESULT CALLED TO, READ BACK BY AND VERIFIED WITH: JASON ROBBINS @ 0458 01/20/23 [facility]    Enterobacter cloacae complex NOT DETECTED NOT DETECTED Final   Escherichia coli NOT DETECTED NOT DETECTED Final   Klebsiella aerogenes NOT DETECTED NOT DETECTED Final   Klebsiella oxytoca NOT DETECTED NOT DETECTED Final   Klebsiella pneumoniae DETECTED (A) NOT DETECTED Final    Comment: CRITICAL RESULT CALLED TO, READ BACK BY AND VERIFIED WITH: JASON ROBBINS @ 0458 01/20/23 [facility]    Proteus species NOT DETECTED NOT DETECTED Final   Salmonella species NOT DETECTED NOT DETECTED Final   Serratia marcescens NOT DETECTED NOT DETECTED Final   Haemophilus influenzae NOT DETECTED NOT DETECTED Final   Neisseria meningitidis NOT DETECTED NOT DETECTED Final   Pseudomonas aeruginosa NOT DETECTED NOT DETECTED Final   Stenotrophomonas maltophilia NOT  DETECTED NOT DETECTED Final   Candida albicans NOT DETECTED NOT DETECTED Final   Candida auris NOT DETECTED NOT DETECTED Final   Candida glabrata NOT DETECTED NOT DETECTED Final   Candida krusei NOT DETECTED NOT DETECTED Final   Candida parapsilosis NOT DETECTED NOT DETECTED Final   Candida tropicalis NOT DETECTED NOT DETECTED Final   Cryptococcus neoformans/gattii NOT DETECTED NOT DETECTED Final   CTX-M ESBL NOT DETECTED NOT DETECTED Final   Carbapenem resistance IMP NOT DETECTED NOT DETECTED Final   Carbapenem resistance KPC NOT DETECTED NOT DETECTED Final   Carbapenem resistance NDM NOT DETECTED NOT DETECTED Final   Carbapenem resist OXA 48 LIKE NOT DETECTED NOT DETECTED Final   Carbapenem resistance VIM NOT DETECTED NOT DETECTED Final    Comment: Performed at [facility], [address] [place], [place] [address]  Culture, blood (Routine X 2) w Reflex to ID Panel     Status: None (Preliminary result)  Collection Time: 01/22/23  3:45 PM   Specimen: BLOOD  Result Value Ref Range Status   Specimen Description BLOOD BLOOD RIGHT HAND  Final   Special Requests   Final    BOTTLES DRAWN AEROBIC AND ANAEROBIC Blood Culture results may not be optimal due to an inadequate volume of blood received in culture bottles   Culture   Final    NO GROWTH 2 DAYS Performed at [facility], [address], [place], [place] [address]    Report Status PENDING  Incomplete  Culture, blood (Routine X 2) w Reflex to ID Panel     Status: None (Preliminary result)   Collection Time: 01/22/23  3:52 PM   Specimen: BLOOD  Result Value Ref Range Status   Specimen Description BLOOD LEFT ANTECUBITAL  Final   Special Requests   Final    BOTTLES DRAWN AEROBIC AND ANAEROBIC Blood Culture adequate volume   Culture   Final    NO GROWTH 2 DAYS Performed at [facility], [address] [place], [place] [address]    Report Status PENDING  Incomplete     Labs: BNP (last 3  results) No results for input(s): BNP in the last 8760 hours. Basic Metabolic Panel: Recent Labs  Lab 01/19/23 1423 01/20/23 0515 01/21/23 0628 01/23/23 0503 01/24/23 0516  NA 137 141 138 138 141  K 4.1 4.5 3.7 4.2 4.2  CL 100 106 104 101 105  CO2 24 27 25 26 27   GLUCOSE 124* 88 91 126* 88  BUN 13 15 15 18 19   CREATININE 1.07 1.27* 1.05 0.94 1.05  CALCIUM  9.2 9.0 8.8* 9.0 8.8*   Liver Function Tests: Recent Labs  Lab 01/19/23 1647 01/20/23 0515 01/21/23 0628 01/23/23 0503  AST 272* 143* 59* 28  ALT 242* 201* 129* 77*  ALKPHOS 311* 258* 222* 188*  BILITOT 2.2* 2.2* 1.1 0.8  PROT 8.0 6.9 6.6 7.1  ALBUMIN 4.2 3.4* 3.4* 3.6   Recent Labs  Lab 01/19/23 1647  LIPASE 29   No results for input(s): AMMONIA in the last 168 hours. CBC: Recent Labs  Lab 01/19/23 1423 01/20/23 0515 01/21/23 0628 01/23/23 0503 01/24/23 0516  WBC 7.4 13.3* 9.1 5.7 11.3*  NEUTROABS  --  11.1*  --  4.7  --   HGB 14.8 13.2 12.8* 14.3 13.3  HCT 47.3 40.9 38.9* 44.1 40.4  MCV 90.1 86.7 85.7 86.6 84.7  PLT 180 180 187 194 200   Cardiac Enzymes: Recent Labs  Lab 01/19/23 1423  CKTOTAL 238   BNP: Invalid input(s): POCBNP CBG: Recent Labs  Lab 01/23/23 1403 01/23/23 1641 01/23/23 2146 01/24/23 0746 01/24/23 1140  GLUCAP 128* 110* 77 78 101*   D-Dimer No results for input(s): DDIMER in the last 72 hours. Hgb A1c No results for input(s): HGBA1C in the last 72 hours. Lipid Profile No results for input(s): CHOL, HDL, LDLCALC, TRIG, CHOLHDL, LDLDIRECT in the last 72 hours. Thyroid  function studies No results for input(s): TSH, T4TOTAL, T3FREE, THYROIDAB in the last 72 hours.  Invalid input(s): FREET3 Anemia work up No results for input(s): VITAMINB12, FOLATE, FERRITIN, TIBC, IRON, RETICCTPCT in the last 72 hours. Urinalysis    Component Value Date/Time   COLORURINE YELLOW (A) 01/19/2023 1554   APPEARANCEUR CLEAR (A) 01/19/2023 1554    LABSPEC 1.014 01/19/2023 1554   PHURINE 5.0 01/19/2023 1554   GLUCOSEU NEGATIVE 01/19/2023 1554   HGBUR MODERATE (A) 01/19/2023 1554   BILIRUBINUR NEGATIVE 01/19/2023 1554   BILIRUBINUR Negative  01/18/2023 0850   KETONESUR NEGATIVE 01/19/2023 1554   PROTEINUR 30 (A) 01/19/2023 1554   UROBILINOGEN 0.2 01/18/2023 0850   NITRITE NEGATIVE 01/19/2023 1554   LEUKOCYTESUR NEGATIVE 01/19/2023 1554   Sepsis Labs Recent Labs  Lab 01/20/23 0515 01/21/23 0628 01/23/23 0503 01/24/23 0516  WBC 13.3* 9.1 5.7 11.3*   Microbiology Recent Results (from the past 240 hours)  CULTURE, URINE COMPREHENSIVE     Status: Abnormal   Collection Time: 01/18/23 10:19 AM   Specimen: Urine  Result Value Ref Range Status   MICRO NUMBER: [id number]  Final   SPECIMEN QUALITY: Adequate  Final   Source OTHER (SPECIFY)  Final   STATUS: FINAL  Final   ISOLATE 1: Gram positive cocci isolated (A)  Final    Comment: 1,000-9,000 CFU/ML of Gram positive cocci isolated May represent colonizers from external and internal genitalia. No further testing (including susceptibility) will be performed.  Resp panel by RT-PCR (RSV, Flu A&B, Covid) Anterior Nasal Swab     Status: None   Collection Time: 01/19/23  2:16 PM   Specimen: Anterior Nasal Swab  Result Value Ref Range Status   SARS Coronavirus 2 by RT PCR NEGATIVE NEGATIVE Final    Comment: (NOTE) SARS-CoV-2 target nucleic acids are NOT DETECTED.  The SARS-CoV-2 RNA is generally detectable in upper respiratory specimens during the acute phase of infection. The lowest concentration of SARS-CoV-2 viral copies this assay can detect is 138 copies/mL. A negative result does not preclude SARS-Cov-2 infection and should not be used as the sole basis for treatment or other patient management decisions. A negative result may occur with  improper specimen collection/handling, submission of specimen other than nasopharyngeal swab, presence of viral mutation(s) within  the areas targeted by this assay, and inadequate number of viral copies(<138 copies/mL). A negative result must be combined with clinical observations, patient history, and epidemiological information. The expected result is Negative.  Fact Sheet for Patients:  bloggercourse.com  Fact Sheet for Healthcare Providers:  seriousbroker.it  This test is no t yet approved or cleared by the [place]  FDA and  has been authorized for detection and/or diagnosis of SARS-CoV-2 by FDA under an Emergency Use Authorization (EUA). This EUA will remain  in effect (meaning this test can be used) for the duration of the COVID-19 declaration under Section 564(b)(1) of the Act, 21 U.S.C.section 360bbb-3(b)(1), unless the authorization is terminated  or revoked sooner.       Influenza A by PCR NEGATIVE NEGATIVE Final   Influenza B by PCR NEGATIVE NEGATIVE Final    Comment: (NOTE) The Xpert Xpress SARS-CoV-2/FLU/RSV plus assay is intended as an aid in the diagnosis of influenza from Nasopharyngeal swab specimens and should not be used as a sole basis for treatment. Nasal washings and aspirates are unacceptable for Xpert Xpress SARS-CoV-2/FLU/RSV testing.  Fact Sheet for Patients: bloggercourse.com  Fact Sheet for Healthcare Providers: seriousbroker.it  This test is not yet approved or cleared by the [place]  FDA and has been authorized for detection and/or diagnosis of SARS-CoV-2 by FDA under an Emergency Use Authorization (EUA). This EUA will remain in effect (meaning this test can be used) for the duration of the COVID-19 declaration under Section 564(b)(1) of the Act, 21 U.S.C. section 360bbb-3(b)(1), unless the authorization is terminated or revoked.     Resp Syncytial Virus by PCR NEGATIVE NEGATIVE Final    Comment: (NOTE) Fact Sheet for  Patients: bloggercourse.com  Fact Sheet for Healthcare Providers: seriousbroker.it  This test is not  yet approved or cleared by the [place]  FDA and has been authorized for detection and/or diagnosis of SARS-CoV-2 by FDA under an Emergency Use Authorization (EUA). This EUA will remain in effect (meaning this test can be used) for the duration of the COVID-19 declaration under Section 564(b)(1) of the Act, 21 U.S.C. section 360bbb-3(b)(1), unless the authorization is terminated or revoked.  Performed at [facility], [address] [place], [place] [address]   Blood culture (single)     Status: Abnormal   Collection Time: 01/19/23  3:24 PM   Specimen: BLOOD  Result Value Ref Range Status   Specimen Description   Final    BLOOD BLOOD RIGHT FOREARM Performed at [facility], [address] [place], [place] [address]    Special Requests   Final    BOTTLES DRAWN AEROBIC AND ANAEROBIC Blood Culture results may not be optimal due to an inadequate volume of blood received in culture bottles Performed at [facility], [address] [place], [place] [address]    Culture  Setup Time   Final    GRAM NEGATIVE RODS IN BOTH AEROBIC AND ANAEROBIC BOTTLES CRITICAL RESULT CALLED TO, READ BACK BY AND VERIFIED WITH: JASON ROBBBINS @ 0458 01/20/23 [facility]    Culture KLEBSIELLA PNEUMONIAE (A)  Final   Report Status 01/22/2023 FINAL  Final   Organism ID, Bacteria KLEBSIELLA PNEUMONIAE  Final   Organism ID, Bacteria KLEBSIELLA PNEUMONIAE  Final      Susceptibility   Klebsiella pneumoniae - KIRBY BAUER*    CEFAZOLIN  SENSITIVE Sensitive    Klebsiella pneumoniae - MIC*    AMPICILLIN RESISTANT Resistant     CEFEPIME <=0.12 SENSITIVE Sensitive     CEFTAZIDIME <=1 SENSITIVE Sensitive     CEFTRIAXONE  <=0.25 SENSITIVE Sensitive     CIPROFLOXACIN <=0.25 SENSITIVE Sensitive     GENTAMICIN  <=1 SENSITIVE Sensitive      IMIPENEM <=0.25 SENSITIVE Sensitive     TRIMETH /SULFA  <=20 SENSITIVE Sensitive     AMPICILLIN/SULBACTAM <=2 SENSITIVE Sensitive     PIP/TAZO <=4 SENSITIVE Sensitive ug/mL    * KLEBSIELLA PNEUMONIAE    KLEBSIELLA PNEUMONIAE  Blood Culture ID Panel (Reflexed)     Status: Abnormal   Collection Time: 01/19/23  3:24 PM  Result Value Ref Range Status   Enterococcus faecalis NOT DETECTED NOT DETECTED Final   Enterococcus Faecium NOT DETECTED NOT DETECTED Final   Listeria monocytogenes NOT DETECTED NOT DETECTED Final   Staphylococcus species NOT DETECTED NOT DETECTED Final   Staphylococcus aureus (BCID) NOT DETECTED NOT DETECTED Final   Staphylococcus epidermidis NOT DETECTED NOT DETECTED Final   Staphylococcus lugdunensis NOT DETECTED NOT DETECTED Final   Streptococcus species NOT DETECTED NOT DETECTED Final   Streptococcus agalactiae NOT DETECTED NOT DETECTED Final   Streptococcus pneumoniae NOT DETECTED NOT DETECTED Final   Streptococcus pyogenes NOT DETECTED NOT DETECTED Final   A.calcoaceticus-baumannii NOT DETECTED NOT DETECTED Final   Bacteroides fragilis NOT DETECTED NOT DETECTED Final   Enterobacterales DETECTED (A) NOT DETECTED Final    Comment: Enterobacterales represent a large order of gram negative bacteria, not a single organism. CRITICAL RESULT CALLED TO, READ BACK BY AND VERIFIED WITH: JASON ROBBINS @ 0458 01/20/23 [facility]    Enterobacter cloacae complex NOT DETECTED NOT DETECTED Final   Escherichia coli NOT DETECTED NOT DETECTED Final   Klebsiella aerogenes NOT DETECTED NOT DETECTED Final   Klebsiella oxytoca NOT DETECTED NOT DETECTED Final   Klebsiella pneumoniae DETECTED (A) NOT DETECTED Final  Comment: CRITICAL RESULT CALLED TO, READ BACK BY AND VERIFIED WITH: JASON ROBBINS @ 0458 01/20/23 [facility]    Proteus species NOT DETECTED NOT DETECTED Final   Salmonella species NOT DETECTED NOT DETECTED Final   Serratia marcescens NOT DETECTED NOT DETECTED Final   Haemophilus  influenzae NOT DETECTED NOT DETECTED Final   Neisseria meningitidis NOT DETECTED NOT DETECTED Final   Pseudomonas aeruginosa NOT DETECTED NOT DETECTED Final   Stenotrophomonas maltophilia NOT DETECTED NOT DETECTED Final   Candida albicans NOT DETECTED NOT DETECTED Final   Candida auris NOT DETECTED NOT DETECTED Final   Candida glabrata NOT DETECTED NOT DETECTED Final   Candida krusei NOT DETECTED NOT DETECTED Final   Candida parapsilosis NOT DETECTED NOT DETECTED Final   Candida tropicalis NOT DETECTED NOT DETECTED Final   Cryptococcus neoformans/gattii NOT DETECTED NOT DETECTED Final   CTX-M ESBL NOT DETECTED NOT DETECTED Final   Carbapenem resistance IMP NOT DETECTED NOT DETECTED Final   Carbapenem resistance KPC NOT DETECTED NOT DETECTED Final   Carbapenem resistance NDM NOT DETECTED NOT DETECTED Final   Carbapenem resist OXA 48 LIKE NOT DETECTED NOT DETECTED Final   Carbapenem resistance VIM NOT DETECTED NOT DETECTED Final    Comment: Performed at [facility], [address] [place], [place] [address]  Culture, blood (Routine X 2) w Reflex to ID Panel     Status: None (Preliminary result)   Collection Time: 01/22/23  3:45 PM   Specimen: BLOOD  Result Value Ref Range Status   Specimen Description BLOOD BLOOD RIGHT HAND  Final   Special Requests   Final    BOTTLES DRAWN AEROBIC AND ANAEROBIC Blood Culture results may not be optimal due to an inadequate volume of blood received in culture bottles   Culture   Final    NO GROWTH 2 DAYS Performed at [facility], [address] [place], [place] [address]    Report Status PENDING  Incomplete  Culture, blood (Routine X 2) w Reflex to ID Panel     Status: None (Preliminary result)   Collection Time: 01/22/23  3:52 PM   Specimen: BLOOD  Result Value Ref Range Status   Specimen Description BLOOD LEFT ANTECUBITAL  Final   Special Requests   Final    BOTTLES DRAWN AEROBIC AND ANAEROBIC Blood Culture adequate volume    Culture   Final    NO GROWTH 2 DAYS Performed at [facility], [address], [place], [place] [address]    Report Status PENDING  Incomplete     Time coordinating discharge: Over 30 minutes  SIGNED:   Anthony CHRISTELLA Pouch, MD  [facility] 01/24/2023, 1:11 PM Pager   If 7PM-7AM, please contact night-coverage [email or web address]

## 2023-01-25 ENCOUNTER — Telehealth: Payer: Self-pay

## 2023-01-25 NOTE — Anesthesia Postprocedure Evaluation (Signed)
 Anesthesia Post Note  Patient: Blake Rivera  Procedure(s) Performed: XI ROBOTIC ASSISTED LAPAROSCOPIC CHOLECYSTECTOMY INDOCYANINE GREEN  FLUORESCENCE IMAGING (ICG)  Patient location during evaluation: PACU Anesthesia Type: General Level of consciousness: awake and alert Pain management: pain level controlled Vital Signs Assessment: post-procedure vital signs reviewed and stable Respiratory status: spontaneous breathing, nonlabored ventilation, respiratory function stable and patient connected to nasal cannula oxygen Cardiovascular status: blood pressure returned to baseline and stable Postop Assessment: no apparent nausea or vomiting Anesthetic complications: no   There were no known notable events for this encounter.   Last Vitals:  Vitals:   01/24/23 0453 01/24/23 0745  BP: (!) 137/57 129/84  Pulse: (!) 47 (!) 50  Resp: 18 16  Temp: 36.9 C 37 C  SpO2: 95% 97%    Last Pain:  Vitals:   01/24/23 0845  TempSrc:   PainSc: 0-No pain                 Blake Rivera

## 2023-01-25 NOTE — Transitions of Care (Post Inpatient/ED Visit) (Signed)
   01/25/2023  Name: Blake Rivera MRN: [id number] DOB: 07/30/1934  Today's TOC FU Call Status: Today's TOC FU Call Status:: Unsuccessful Call (1st Attempt) Unsuccessful Call (1st Attempt) Date: 01/25/23  Attempted to reach the patient regarding the most recent Inpatient/ED visit.Spoke with patient briefly. He stated he was in a meeting ad not able to talk   Follow Up Plan: Additional outreach attempts will be made to reach the patient to complete the Transitions of Care (Post Inpatient/ED visit) call.    Bari Mayans , BSN, RN Care Management Coordinator [place]   [facility] [email or web address] Direct Dial: [phone]

## 2023-01-26 ENCOUNTER — Telehealth: Payer: Self-pay

## 2023-01-26 NOTE — Patient Instructions (Signed)
 Visit Information  Thank you for taking time to visit with me today. Please don't hesitate to contact me if I can be of assistance to you before our next scheduled telephone appointment.  Our next appointment is by telephone on 01/30/23 at 2:30pm   Following is a copy of your care plan:   Goals Addressed             This Visit's Progress    TOC Care Plan       Current Barriers:  Chronic Disease Management support and education needs related to s/p Pneumonia    RNCM Clinical Goal(s):  Patient will work with the Care Management team over the next 30 days to address Transition of Care Barriers: Medication Management Provider appointments take all medications exactly as prescribed and will call provider for medication related questions as evidenced by no missed medication doses , completing ABT  attend all scheduled medical appointments: with PC and surgical follow-up  as evidenced by no missed appointments   through collaboration with RN Care manager, provider, and care team.   Interventions: Evaluation of current treatment plan related to  self management and patient's adherence to plan as established by provider  Transitions of Care:  New goal. Doctor Visits  - discussed the importance of doctor visits Post discharge activity limitations prescribed by provider reviewed Post-op wound/incision care reviewed with patient/caregiver Reviewed Signs and symptoms of infection  Patient Goals/Self-Care Activities: Participate in Transition of Care Program/Attend TOC scheduled calls Take all medications as prescribed Attend all scheduled provider appointments Call pharmacy for medication refills 3-7 days in advance of running out of medications Attend church or other social activities Perform all self care activities independently  Perform IADL's (shopping, preparing meals, housekeeping, managing finances) independently Call provider office for new concerns or questions   Follow Up  Plan:  Telephone follow up appointment with care management team member scheduled for:  01/30/23 2:30 pm  The patient has been provided with contact information for the care management team and has been advised to call with any health related questions or concerns.          Reviewed goals for care Patient/ Caregiver verbalizes understanding of instructions with the plan of care . The  Patient / Caregiver was encouraged to make informed decisions about care, actively participate in managing health conditions, and implement lifestyle changes as needed to promote independence and self-management of healthcare. SDOH screenings have been completed and addressed if indicted There are no reported barriers to care.    Follow-up Plan [facility] Case Management Nurse will provide follow-up and on-going assessment ,evaluation and education of disease processes, recommended interventions for both chronic and acute medical conditions ,  along with ongoing review of symptoms ,medication reviews / reconciliation during each weekly call . Any updates , inconsistencies, discrepancies or acute care concerns will be addressed and routed to the correct Practitioner if indicated   [facility]  Please call the care guide team at [phone]  if you need to cancel or reschedule your appointment . For scheduled calls -Three attempts will be made to reach you -if the scheduled call is missed or  we are unable to reach the you after 3 attempts no additional outreach attempts will be made and the TOC follow-up will be closed .   If you need to speak to a Nurse you may  call me directly at the number below or if I am unavailable,and  your need is urgent  please  call the main [facility] number at [phone] and ask to speak with one of the [facility] ( Transition of Care )  Nurses  .                                                                               Additionally, If you experience worsening of your symptoms,  develop shortness of breath, If you are experiencing a medical emergency,  develop suicidal or homicidal thoughts you must seek medical attention immediately by calling 911 or report to your local emergency department or urgent care.   If you have a non-emergency medical problem during routine business hours, please contact your provider's office and ask to speak with a nurse.       Please take the time to read instructions/literature along with the possible adverse reactions/side effects for all the Medicines that have been prescribed to you. Only take newly prescribed  Medications after you have completely understood and accept all the possible adverse reactions/side effects.   Do not take more than prescribed Medications for  Pain, Sleep and Anxiety. Do not drive when taking Pain medications or sleep aid/ insomnia  medications It is not advisable to combine anxiety, sleep and pain medications without talking with your primary care practitioner    If you are experiencing a Mental Health or Behavioral Health Crisis or need someone to talk to Please call the Suicide and Crisis Lifeline: 28 You may also call the [place]  National Suicide Prevention Lifeline: (480) 407-1457 or TTY: 920 771 6399 TTY (410) 580-5922) to talk to a trained counselor.  You may call the Behavioral Health Crisis Line at 269-470-4966, at any time, 24 hours a day, 7 days a week- however If you are in danger or need immediate medical attention, call 911.   If you would like help to quit smoking, call 1-800-QUIT-NOW ( 819-835-5766) OR Espaol: 1-855-Djelo-Ya (8-144-664-6430) o para ms informacin haga clic aqu or Text READY to 799-599 to register via text.   Bari Mayans , BSN, RN Care Management Coordinator [place]   [facility] [email or web address] Direct Dial: [phone]

## 2023-01-26 NOTE — Anesthesia Postprocedure Evaluation (Signed)
 Anesthesia Post Note  Patient: TRENNON TORBECK  Procedure(s) Performed: ENDOSCOPIC RETROGRADE CHOLANGIOPANCREATOGRAPHY (ERCP) REMOVAL OF STONES SPHINCTEROTOMY Balloon dilation wire-guided HOT HEMOSTASIS (ARGON PLASMA COAGULATION/BICAP)  Patient location during evaluation: Endoscopy Anesthesia Type: General Level of consciousness: awake and alert Pain management: pain level controlled Vital Signs Assessment: post-procedure vital signs reviewed and stable Respiratory status: spontaneous breathing, nonlabored ventilation, respiratory function stable and patient connected to nasal cannula oxygen Cardiovascular status: blood pressure returned to baseline and stable Postop Assessment: no apparent nausea or vomiting Anesthetic complications: no   No notable events documented.   Last Vitals:  Vitals:   01/24/23 0453 01/24/23 0745  BP: (!) 137/57 129/84  Pulse: (!) 47 (!) 50  Resp: 18 16  Temp: 36.9 C 37 C  SpO2: 95% 97%    Last Pain:  Vitals:   01/24/23 0845  TempSrc:   PainSc: 0-No pain                 Prentice Murphy

## 2023-01-26 NOTE — Transitions of Care (Post Inpatient/ED Visit) (Signed)
 01/26/2023  Name: Blake Rivera MRN: [id number] DOB: 09/12/1934  Today's TOC FU Call Status: Today's TOC FU Call Status:: Successful TOC FU Call Completed Unsuccessful Call (1st Attempt) Date: 01/25/23 [facility] FU Call Complete Date: 01/26/23 Patient's Name and Date of Birth confirmed.  Transition Care Management Follow-up Telephone Call Date of Discharge: 01/24/23 Discharge Facility: [facility]) Type of Discharge: Inpatient Admission Primary Inpatient Discharge Diagnosis:: Bacteremia due to Klebsiella pneumoniae How have you been since you were released from the hospital?: Better Any questions or concerns?: No  Items Reviewed: Did you receive and understand the discharge instructions provided?: Yes Medications obtained,verified, and reconciled?: Yes (Medications Reviewed) (Medication reconciliation completed based on recent discharge summary Patient taking medications as instructed and is aware of any changes or dosage adjustments medication regimen. Patient denies questions and reports no barriers to medication adherence) Any new allergies since your discharge?: No Dietary orders reviewed?: Yes Type of Diet Ordered:: Reg Heart Healthy, Carb Modified Do you have support at home?: Yes People in Home: spouse, child(ren), adult Name of Support/Comfort Primary Source: Wife Ronal Daughter Charletta  Medications Reviewed Today: Medications Reviewed Today     Reviewed by Willma Camelia CROME, RN (Registered Nurse) on 01/26/23 at 1133  Med List Status: <None>   Medication Order Taking? Sig Documenting Provider Last Dose Status Informant  acetaminophen  (TYLENOL ) 325 MG tablet [id number] Yes Take 1 tablet (325 mg total) by mouth every 8 (eight) hours as needed for mild pain (pain score 1-3). Sakai, Isami, DO Taking Active   amLODipine  (NORVASC ) 10 MG tablet [id number] Yes Take 1 tablet (10 mg total) by mouth daily. [name], DO Taking Active    amoxicillin -clavulanate (AUGMENTIN ) 875-125 MG tablet [id number] Yes Take 1 tablet by mouth 2 (two) times daily for 7 days. Trudy Anthony HERO, MD Taking Active   atorvastatin  (LIPITOR) 10 MG tablet [id number] Yes Take 1 tablet (10 mg total) by mouth daily. [name], DO Taking Active   docusate sodium  (COLACE) 100 MG capsule [id number] Yes Take 1 capsule (100 mg total) by mouth 2 (two) times daily as needed for up to 10 days for mild constipation. Sakai, Isami, DO Taking Active   ibuprofen  (ADVIL ) 400 MG tablet [id number] Yes Take 1 tablet (400 mg total) by mouth every 8 (eight) hours as needed for mild pain (pain score 1-3) or moderate pain (pain score 4-6). Sakai, Isami, DO Taking Active   metFORMIN  (GLUCOPHAGE ) 500 MG tablet [id number] No Take 500 mg by mouth daily with breakfast.  Patient not taking: Reported on 01/26/2023   [provider] Not Taking Active   oxyCODONE -acetaminophen  (PERCOCET) 5-325 MG tablet [id number] No Take 1 tablet by mouth every 8 (eight) hours as needed for up to 5 days for severe pain (pain score 7-10).  Patient not taking: Reported on 01/26/2023   Trudy Anthony HERO, MD Not Taking Active   tamsulosin  (FLOMAX ) 0.4 MG CAPS capsule [id number] Yes Take 2 capsules (0.8 mg total) by mouth daily. [name], DO Taking Active             Home Care and Equipment/Supplies: Were Home Health Services Ordered?: No Any new equipment or medical supplies ordered?: No  Functional Questionnaire: Do you need assistance with bathing/showering or dressing?: No Do you need assistance with meal preparation?: No Do you need assistance with eating?: No Do you have difficulty maintaining continence: No Do you need assistance with getting out of bed/getting out of a chair/moving?: No Do you have difficulty  managing or taking your medications?: No  Follow up appointments reviewed: PCP Follow-up appointment confirmed?: Yes Date of PCP follow-up appointment?:  01/29/23 Follow-up Provider: Helga Fischer Specialist [facility] Follow-up appointment confirmed?: Yes Date of Specialist follow-up appointment?: 02/07/23 Follow-Up Specialty Provider:: Surgcial Do you need transportation to your follow-up appointment?: No (He drives)  SDOH Interventions Today    Flowsheet Row Most Recent Value  SDOH Interventions   Food Insecurity Interventions Intervention Not Indicated  Housing Interventions Intervention Not Indicated  Transportation Interventions Intervention Not Indicated, Patient Resources (Friends/Family)  Utilities Interventions Intervention Not Indicated  Social Connections Interventions Intervention Not Indicated       Goals Addressed             This Visit's Progress    TOC Care Plan       Current Barriers:  Chronic Disease Management support and education needs related to s/p Pneumonia    RNCM Clinical Goal(s):  Patient will work with the Care Management team over the next 30 days to address Transition of Care Barriers: Medication Management Provider appointments take all medications exactly as prescribed and will call provider for medication related questions as evidenced by no missed medication doses , completing ABT  attend all scheduled medical appointments: with PC and surgical follow-up  as evidenced by no missed appointments   through collaboration with RN Care manager, provider, and care team.   Interventions: Evaluation of current treatment plan related to  self management and patient's adherence to plan as established by provider  Transitions of Care:  New goal. Doctor Visits  - discussed the importance of doctor visits Post discharge activity limitations prescribed by provider reviewed Post-op wound/incision care reviewed with patient/caregiver Reviewed Signs and symptoms of infection  Patient Goals/Self-Care Activities: Participate in Transition of Care Program/Attend TOC scheduled calls Take all medications as  prescribed Attend all scheduled provider appointments Call pharmacy for medication refills 3-7 days in advance of running out of medications Attend church or other social activities Perform all self care activities independently  Perform IADL's (shopping, preparing meals, housekeeping, managing finances) independently Call provider office for new concerns or questions   Follow Up Plan:  Telephone follow up appointment with care management team member scheduled for:  01/30/23 2:30 pm  The patient has been provided with contact information for the care management team and has been advised to call with any health related questions or concerns.          Routine follow-up and on-going assessment evaluation and education of disease processes, recommended interventions for both chronic and acute medical conditions , will occur during each weekly visit along with ongoing review of symptoms ,medication reviews and reconciliation. Any updates , inconsistencies, discrepancies or acute care concerns will be addressed and routed to the correct Practitioner if indicated   Based on current information and Insurance plan -Reviewed benefits available to patient, including details about eligibility options for care if any area of needs were identified.  Reviewed patients ability to access and / or navigating the benefits system..Amb Referral made if indicted , refer to orders section of note for details   Please refer to Care Plan for goals and interventions -Effectiveness of interventions, symptom management and outcomes will be evaluated  weekly during [facility] 30-day Program Outreach calls  . Any necessary  changes and updates to Care Plan will be completed episodically    Reviewed goals for care Patient verbalizes understanding of instructions and care plan provided. Patient was encouraged to make informed decisions  about their care, actively participate in managing their health condition, and implement lifestyle  changes as needed to promote independence and self-management of health care    The patient has been provided with contact information for the care management team and has been advised to call with any health-related questions or concerns.     Patient is at high risk for readmission and/or has history of  high utilization  Discussed VBCI  TOC program and weekly calls to patient to assess condition/status, medication management  and provide support/education as indicated . Patient/ Caregiver voiced understanding and is  agreeable to 30 day program    Bari Mayans , SCIENTIST, RESEARCH (PHYSICAL SCIENCES), RN Care Management Coordinator [place]   [facility] [email or web address] Direct Dial: [phone]

## 2023-01-27 LAB — CULTURE, BLOOD (ROUTINE X 2)
Culture: NO GROWTH
Culture: NO GROWTH
Special Requests: ADEQUATE

## 2023-01-29 ENCOUNTER — Encounter: Payer: Self-pay | Admitting: Internal Medicine

## 2023-01-29 ENCOUNTER — Other Ambulatory Visit: Payer: Self-pay

## 2023-01-29 ENCOUNTER — Ambulatory Visit (INDEPENDENT_AMBULATORY_CARE_PROVIDER_SITE_OTHER): Payer: Medicare Other | Admitting: Internal Medicine

## 2023-01-29 VITALS — BP 128/76 | HR 98 | Temp 97.9°F | Resp 16 | Ht 72.0 in | Wt 212.6 lb

## 2023-01-29 DIAGNOSIS — E1165 Type 2 diabetes mellitus with hyperglycemia: Secondary | ICD-10-CM | POA: Diagnosis not present

## 2023-01-29 DIAGNOSIS — Z7984 Long term (current) use of oral hypoglycemic drugs: Secondary | ICD-10-CM

## 2023-01-29 DIAGNOSIS — Z09 Encounter for follow-up examination after completed treatment for conditions other than malignant neoplasm: Secondary | ICD-10-CM

## 2023-01-29 DIAGNOSIS — E785 Hyperlipidemia, unspecified: Secondary | ICD-10-CM

## 2023-01-29 DIAGNOSIS — R7881 Bacteremia: Secondary | ICD-10-CM

## 2023-01-29 DIAGNOSIS — Z9049 Acquired absence of other specified parts of digestive tract: Secondary | ICD-10-CM | POA: Diagnosis not present

## 2023-01-29 DIAGNOSIS — N281 Cyst of kidney, acquired: Secondary | ICD-10-CM | POA: Diagnosis not present

## 2023-01-29 DIAGNOSIS — R319 Hematuria, unspecified: Secondary | ICD-10-CM

## 2023-01-29 DIAGNOSIS — I10 Essential (primary) hypertension: Secondary | ICD-10-CM | POA: Diagnosis not present

## 2023-01-29 DIAGNOSIS — R351 Nocturia: Secondary | ICD-10-CM

## 2023-01-29 DIAGNOSIS — Z125 Encounter for screening for malignant neoplasm of prostate: Secondary | ICD-10-CM

## 2023-01-29 DIAGNOSIS — B961 Klebsiella pneumoniae [K. pneumoniae] as the cause of diseases classified elsewhere: Secondary | ICD-10-CM | POA: Diagnosis not present

## 2023-01-29 LAB — POCT URINALYSIS DIPSTICK
Bilirubin, UA: NEGATIVE
Glucose, UA: NEGATIVE
Ketones, UA: NEGATIVE
Leukocytes, UA: NEGATIVE
Nitrite, UA: NEGATIVE
Protein, UA: NEGATIVE
Spec Grav, UA: 1.02 (ref 1.010–1.025)
Urobilinogen, UA: 0.2 U/dL
pH, UA: 5 (ref 5.0–8.0)

## 2023-01-29 MED ORDER — ATORVASTATIN CALCIUM 10 MG PO TABS: 10.0000 mg | ORAL_TABLET | Freq: Every day | ORAL | 3 refills | Status: DC

## 2023-01-29 NOTE — Assessment & Plan Note (Signed)
 Stable, on Flomax. Patient wanting to check PSA, discussed that due to age this most likely will be elevated but he has never had an elevated PSA in the past.

## 2023-01-29 NOTE — Assessment & Plan Note (Signed)
 Check A1c but patient does not want to be on the Metformin due to some family members having side effects, will discontinue and continue to monitor.

## 2023-01-29 NOTE — Telephone Encounter (Signed)
 Requested Prescriptions  Pending Prescriptions Disp Refills   amLODipine  (NORVASC ) 10 MG tablet [Pharmacy Med Name: AMLODIPINE  BESYLATE 10 MG TAB] 90 tablet 1    Sig: TAKE 1 TABLET BY MOUTH EVERY DAY     Cardiovascular: Calcium  Channel Blockers 2 Passed - 01/29/2023  8:28 AM      Passed - Last BP in normal range    BP Readings from Last 1 Encounters:  01/24/23 129/84         Passed - Last Heart Rate in normal range    Pulse Readings from Last 1 Encounters:  01/24/23 (!) 50         Passed - Valid encounter within last 6 months    Recent Outpatient Visits           1 week ago Dysuria   [facility] [place], [place], MD   6 months ago Type 2 diabetes mellitus with hyperglycemia, without long-term current use of insulin  [facility])   [facility] [name], DO   1 year ago Essential hypertension   [place] [facility] [name], DO   1 year ago Need for influenza vaccination   [facility] [name], DO   1 year ago Foreign body of right index finger   [facility] Leavy Mole, [place]       Future Appointments             Today [name], DO [facility]

## 2023-01-29 NOTE — Assessment & Plan Note (Signed)
 Blood pressure stable here today, no changes made to medications.

## 2023-01-29 NOTE — Assessment & Plan Note (Signed)
 Seen on MRCP, did have microscopic blood in the urine, will repeat today.

## 2023-01-29 NOTE — Progress Notes (Signed)
 Established Patient Office Visit  Subjective   Patient ID: Blake Rivera, male    DOB: July 20, 1934  Age: 89 y.o. MRN: [id number]  Chief Complaint  Patient presents with   Medical Management of Chronic Issues    6 month recheck    HPI  Patient presenting for follow up on chronic medical conditions and hospital follow up.  He was recently admitted to the hospital for choledocholithiasis and ended up having a cholecystectomy 01/23/23.   Discharge Date: 01/24/23 Diagnosis: choledocholithiasis Procedures/tests: CT A/P showing cholelithiasis with potential mild stranding around the gallbladder, common bile duct 1.3 cm in diameter. MRCP with cholelithiasis with mild gallbladder wall thickening and trace pericholecystic fluid, choledocholithiasis with intra and extrahepatic biliary ductal dilation with at least 3 gallstones in the common bile duct within the pancreatitic head. Also showing a simple benign left renal cortical cysts that do no require follow up  Consultants: GI and General surgery New medications: Augmentin  x 1 week  Discontinued medications: None Discharge instructions:  Follow up with PCP and Surgery Status: better  Hypertension: -Medications: Amlodipine  10 mg. Has been stable on this medication for years.  -Has history of angioedema with Lisinopril in the past. -Patient is compliant with above medications and reports no side effects. -Checking BP at home (average): doesn't check  -Denies any SOB, CP, vision changes, LE edema or symptoms of hypotension  HLD: -Medications: Lipitor 10 mg, cod liver oil  -Patient is compliant with above medications and reports no side effects.  -Last lipid panel: Lipid Panel     Component Value Date/Time   CHOL 135 01/26/2022 0935   CHOL 195 08/26/2014 1020   TRIG 52 01/26/2022 0935   HDL 58 01/26/2022 0935   HDL 50 08/26/2014 1020   CHOLHDL 2.3 01/26/2022 0935   VLDL 24 07/03/2016 1522   LDLCALC 64 01/26/2022 0935   LABVLDL 29  08/26/2014 1020    Diabetes, Type 2: -Last A1c 6.1% 7/24 -Medications: Started on Metformin  500 mg at LOV but he never took it, still not taking  -Checking BG at home: no -Diet: working on decreasing sugars in the diet  -Eye exam: Has an eye doctor, will schedule diabetic eye exam -Foot exam: UTD 7/24 -Microalbumin: UTD 7/24 -Statin: yes -PNA vaccine: UTD -Denies symptoms of hypoglycemia, polyuria, polydipsia, numbness extremities, foot ulcers/trauma.   Nocturia/LUTS: -Currently on Flomax  0.8 mg for symptoms, taking right before bed -Currently to get up to urinate 2-3 times a night  -Denies dysuria, hematuria, does have slowness to initiate urination. -Patient wanting to check PSA today  Health Maintenance: -Blood work due -Tdap UTD  Patient Active Problem List   Diagnosis Date Noted   Simple renal cyst 01/29/2023   Type 2 diabetes mellitus with hyperglycemia, without long-term current use of insulin  (HCC) 01/29/2023   Choledocholithiasis 01/22/2023   BPH (benign prostatic hyperplasia) 01/22/2023   Bacteremia due to Klebsiella pneumoniae 01/22/2023   Prediabetes 12/23/2019   Class 1 obesity due to excess calories with serious comorbidity and body mass index (BMI) of 31.0 to 31.9 in adult 08/21/2019   Nocturia 08/21/2019   Rotator cuff tendinitis, left 02/08/2018   Traumatic complete tear of left rotator cuff 02/08/2018   Localized, primary osteoarthritis 12/25/2017   Lipoma of scalp 12/03/2017   Obesity (BMI 30.0-34.9) 12/03/2017   CKD (chronic kidney disease) stage 2, GFR 60-89 ml/min 12/03/2017   Dyslipidemia 11/25/2014   Abnormal ECG 07/16/2014   History of colitis 07/16/2014   ED (erectile dysfunction) 07/16/2014  Anterior knee pain 07/16/2014   Arthralgia of shoulder 07/16/2014   Dyspnea 12/29/2013   Essential hypertension 12/29/2013   Past Medical History:  Diagnosis Date   Diverticulitis of colon    ED (erectile dysfunction)    Hyperglycemia     Hypertension    Vitamin D  deficiency    Past Surgical History:  Procedure Laterality Date   ERCP N/A 01/22/2023   Procedure: ENDOSCOPIC RETROGRADE CHOLANGIOPANCREATOGRAPHY (ERCP);  Surgeon: Jinny Carmine, MD;  Location: [facility] ENDOSCOPY;  Service: Endoscopy;  Laterality: N/A;   HERNIA REPAIR     HOT HEMOSTASIS  01/22/2023   Procedure: HOT HEMOSTASIS (ARGON PLASMA COAGULATION/BICAP);  Surgeon: Jinny Carmine, MD;  Location: [facility] ENDOSCOPY;  Service: Endoscopy;;   REMOVAL OF STONES  01/22/2023   Procedure: REMOVAL OF STONES;  Surgeon: Jinny Carmine, MD;  Location: [facility] ENDOSCOPY;  Service: Endoscopy;;   ROTATOR CUFF REPAIR  03/17/2018   SPHINCTEROTOMY  01/22/2023   Procedure: ANNETT;  Surgeon: Jinny Carmine, MD;  Location: [facility] ENDOSCOPY;  Service: Endoscopy;;   Social History   Tobacco Use   Smoking status: Former    Current packs/day: 0.00    Average packs/day: 0.3 packs/day for 25.0 years (6.3 ttl pk-yrs)    Types: Cigarettes    Start date: 05/17/1989    Quit date: 05/18/2014    Years since quitting: 8.7   Smokeless tobacco: Never   Tobacco comments:    Former smoker - no need to provide with smoking cessation materials  Vaping Use   Vaping status: Never Used  Substance Use Topics   Alcohol use: Yes    Alcohol/week: 1.0 standard drink of alcohol    Types: 1 Shots of liquor per week    Comment: moderate - 1 whiskey shot per day   Drug use: No   Social History   Socioeconomic History   Marital status: Married    Spouse name: Not on file   Number of children: 2   Years of education: Not on file   Highest education level: Doctorate  Occupational History   Occupation: Retired  Tobacco Use   Smoking status: Former    Current packs/day: 0.00    Average packs/day: 0.3 packs/day for 25.0 years (6.3 ttl pk-yrs)    Types: Cigarettes    Start date: 05/17/1989    Quit date: 05/18/2014    Years since quitting: 8.7   Smokeless tobacco: Never   Tobacco comments:    Former smoker - no need  to provide with smoking cessation materials  Vaping Use   Vaping status: Never Used  Substance and Sexual Activity   Alcohol use: Yes    Alcohol/week: 1.0 standard drink of alcohol    Types: 1 Shots of liquor per week    Comment: moderate - 1 whiskey shot per day   Drug use: No   Sexual activity: Yes  Other Topics Concern   Not on file  Social History Narrative   Not on file   Social Drivers of Health   Financial Resource Strain: Low Risk  (03/02/2022)   Overall Financial Resource Strain (CARDIA)    Difficulty of Paying Living Expenses: Not hard at all  Food Insecurity: No Food Insecurity (01/26/2023)   Hunger Vital Sign    Worried About Running Out of Food in the Last Year: Never true    Ran Out of Food in the Last Year: Never true  Transportation Needs: No Transportation Needs (01/26/2023)   PRAPARE - Administrator, Civil Service (Medical):  No    Lack of Transportation (Non-Medical): No  Physical Activity: Sufficiently Active (03/02/2022)   Exercise Vital Sign    Days of Exercise per Week: 3 days    Minutes of Exercise per Session: 60 min  Stress: No Stress Concern Present (03/02/2022)   [employer] of Occupational Health - Occupational Stress Questionnaire    Feeling of Stress : Not at all  Social Connections: Socially Integrated (01/26/2023)   Social Connection and Isolation Panel [NHANES]    Frequency of Communication with Friends and Family: More than three times a week    Frequency of Social Gatherings with Friends and Family: More than three times a week    Attends Religious Services: More than 4 times per year    Active Member of [employer] or Organizations: Yes    Attends Engineer, Structural: More than 4 times per year    Marital Status: Married  Catering Manager Violence: Not At Risk (01/26/2023)   Humiliation, Afraid, Rape, and Kick questionnaire    Fear of Current or Ex-Partner: No    Emotionally Abused: No    Physically Abused: No     Sexually Abused: No   Family Status  Relation Name Status   Mother  Deceased   Brother  Deceased   Father  Deceased   Sister  Alive   Brother  Deceased   Brother  Deceased   Sister  Deceased   MGM  Deceased   MGF  Deceased   PGM  Deceased   PGF  Deceased   Daughter  Alive   Daughter  Alive  No partnership data on file   Family History  Problem Relation Age of Onset   Heart disease Mother    Aneurysm Mother    Heart disease Brother    Emphysema Father    Healthy Sister    Healthy Brother    Healthy Brother    Heart disease Sister    Allergies  Allergen Reactions   Lisinopril     Angioedema   Omacor  [Omega-3-Acid Ethyl Esters (Fish)]     Other reaction(s): Rash   Zocor  [Simvastatin]     Muscle Pain      Review of Systems  Constitutional:  Negative for chills and fever.  Respiratory:  Negative for shortness of breath.   Cardiovascular:  Negative for chest pain.  Gastrointestinal:  Negative for abdominal pain, nausea and vomiting.      Objective:     BP 128/76 (Cuff Size: Large)   Pulse 98   Temp 97.9 F (36.6 C)   Resp 16   Ht 6' (1.829 m)   Wt 212 lb 9.6 oz (96.4 kg)   SpO2 99%   BMI 28.83 kg/m  BP Readings from Last 3 Encounters:  01/29/23 128/76  01/24/23 129/84  01/18/23 120/68   Wt Readings from Last 3 Encounters:  01/29/23 212 lb 9.6 oz (96.4 kg)  01/23/23 213 lb (96.6 kg)  01/18/23 216 lb 9.6 oz (98.2 kg)      Physical Exam Constitutional:      Appearance: Normal appearance.  HENT:     Head: Normocephalic and atraumatic.     Mouth/Throat:     Mouth: Mucous membranes are moist.     Pharynx: Oropharynx is clear.  Eyes:     Extraocular Movements: Extraocular movements intact.     Conjunctiva/sclera: Conjunctivae normal.     Pupils: Pupils are equal, round, and reactive to light.  Neck:  Comments: No thyromegaly  Cardiovascular:     Rate and Rhythm: Normal rate and regular rhythm.  Pulmonary:     Effort: Pulmonary effort  is normal.     Breath sounds: Normal breath sounds.  Abdominal:     Comments: Incisions healing well   Musculoskeletal:     Cervical back: No tenderness.  Lymphadenopathy:     Cervical: No cervical adenopathy.  Skin:    General: Skin is warm and dry.  Neurological:     General: No focal deficit present.     Mental Status: He is alert. Mental status is at baseline.  Psychiatric:        Mood and Affect: Mood normal.        Behavior: Behavior normal.      Results for orders placed or performed in visit on 01/29/23  POCT Urinalysis Dipstick  Result Value Ref Range   Color, UA yellow    Clarity, UA clear    Glucose, UA Negative Negative   Bilirubin, UA negative    Ketones, UA negative    Spec Grav, UA 1.020 1.010 - 1.025   Blood, UA trace    pH, UA 5.0 5.0 - 8.0   Protein, UA Negative Negative   Urobilinogen, UA 0.2 0.2 or 1.0 E.U./dL   Nitrite, UA negative    Leukocytes, UA Negative Negative   Appearance clear    Odor none     Last CBC Lab Results  Component Value Date   WBC 11.3 (H) 01/24/2023   HGB 13.3 01/24/2023   HCT 40.4 01/24/2023   MCV 84.7 01/24/2023   MCH 27.9 01/24/2023   RDW 14.4 01/24/2023   PLT 200 01/24/2023   Last metabolic panel Lab Results  Component Value Date   GLUCOSE 88 01/24/2023   NA 141 01/24/2023   K 4.2 01/24/2023   CL 105 01/24/2023   CO2 27 01/24/2023   BUN 19 01/24/2023   CREATININE 1.05 01/24/2023   GFRNONAA >60 01/24/2023   CALCIUM  8.8 (L) 01/24/2023   PROT 7.1 01/23/2023   ALBUMIN 3.6 01/23/2023   LABGLOB 2.6 08/26/2014   AGRATIO 1.7 08/26/2014   BILITOT 0.8 01/23/2023   ALKPHOS 188 (H) 01/23/2023   AST 28 01/23/2023   ALT 77 (H) 01/23/2023   ANIONGAP 9 01/24/2023   Last lipids Lab Results  Component Value Date   CHOL 135 01/26/2022   HDL 58 01/26/2022   LDLCALC 64 01/26/2022   TRIG 52 01/26/2022   CHOLHDL 2.3 01/26/2022   Last hemoglobin A1c Lab Results  Component Value Date   HGBA1C 6.1 (A) 07/27/2022    Last thyroid  functions Lab Results  Component Value Date   TSH 2.86 05/21/2017   Last vitamin D  Lab Results  Component Value Date   VD25OH 21 (L) 05/21/2017   Last vitamin B12 and Folate Lab Results  Component Value Date   VITAMINB12 813 01/26/2022      The ASCVD Risk score (Arnett DK, et al., 2019) failed to calculate for the following reasons:   The 2019 ASCVD risk score is only valid for ages 33 to 62    Assessment & Plan:  Hospital discharge follow-up -     COMPLETE METABOLIC PANEL WITH GFR  S/P cholecystectomy -     COMPLETE METABOLIC PANEL WITH GFR  Simple renal cyst Assessment & Plan: Seen on MRCP, did have microscopic blood in the urine, will repeat today.    Hematuria, unspecified type -     POCT urinalysis  dipstick  Essential hypertension Assessment & Plan: Blood pressure stable here today, no changes made to medications.    Type 2 diabetes mellitus with hyperglycemia, without long-term current use of insulin  (HCC) Assessment & Plan: Check A1c but patient does not want to be on the Metformin  due to some family members having side effects, will discontinue and continue to monitor.   Orders: -     Hemoglobin A1c  Dyslipidemia Assessment & Plan: Recheck cholesterol today, refill statin.   Orders: -     Lipid panel -     Atorvastatin  Calcium ; Take 1 tablet (10 mg total) by mouth daily.  Dispense: 90 tablet; Refill: 3  Nocturia Assessment & Plan: Stable, on Flomax . Patient wanting to check PSA, discussed that due to age this most likely will be elevated but he has never had an elevated PSA in the past.   Orders: -     PSA  Prostate cancer screening -     PSA  Reviewed hospital discharge summary from 01/24/23, labs and imaging. Patient has an outpatient appointment with surgery coming up but he is feeling much better and incisions are healing well. Will repeat labs today.   Return in about 6 months (around 07/29/2023).    Sharyle Fischer,  DO

## 2023-01-29 NOTE — Assessment & Plan Note (Signed)
 Recheck cholesterol today, refill statin.

## 2023-01-30 ENCOUNTER — Other Ambulatory Visit: Payer: Self-pay

## 2023-01-30 LAB — LIPID PANEL
Cholesterol: 134 mg/dL (ref <200)
HDL: 38 mg/dL — ABNORMAL LOW (ref >=40)
LDL Cholesterol (Calc): 81 mg/dL
Non-HDL Cholesterol (Calc): 96 mg/dL (ref <130)
Total CHOL/HDL Ratio: 3.5 (calc) (ref <5.0)
Triglycerides: 73 mg/dL (ref <150)

## 2023-01-30 LAB — COMPLETE METABOLIC PANEL WITH GFR
AG Ratio: 1.4 (calc) (ref 1.0–2.5)
ALT: 35 U/L (ref 9–46)
AST: 24 U/L (ref 10–35)
Albumin: 3.8 g/dL (ref 3.6–5.1)
Alkaline phosphatase (APISO): 124 U/L (ref 35–144)
BUN: 12 mg/dL (ref 7–25)
CO2: 29 mmol/L (ref 20–32)
Calcium: 9.3 mg/dL (ref 8.6–10.3)
Chloride: 104 mmol/L (ref 98–110)
Creat: 0.97 mg/dL (ref 0.70–1.22)
Globulin: 2.7 g/dL (ref 1.9–3.7)
Glucose, Bld: 102 mg/dL — ABNORMAL HIGH (ref 65–99)
Potassium: 4.5 mmol/L (ref 3.5–5.3)
Sodium: 142 mmol/L (ref 135–146)
Total Bilirubin: 0.6 mg/dL (ref 0.2–1.2)
Total Protein: 6.5 g/dL (ref 6.1–8.1)
eGFR: 76 mL/min/{1.73_m2} (ref >=60)

## 2023-01-30 LAB — HEMOGLOBIN A1C
Hgb A1c MFr Bld: 6.4 %{Hb} — ABNORMAL HIGH (ref <5.7)
Mean Plasma Glucose: 137 mg/dL
eAG (mmol/L): 7.6 mmol/L

## 2023-01-30 LAB — PSA: PSA: 2.05 ng/mL (ref <=4.00)

## 2023-01-30 NOTE — Patient Outreach (Signed)
 Care Management  Transitions of Care Program Transitions of Care Post-discharge week 2   01/30/2023 Name: Blake Rivera MRN: [id number] DOB: 28-Jul-1934  Subjective: Blake Rivera is a 89 y.o. year old male who is a primary care patient of [name], DO. The Care Management team Engaged with patient Engaged with patient by telephone to assess and address transitions of care needs.   Consent to Services:  Patient was given information about care management services, agreed to services, and gave verbal consent to participate.   Assessment:   Patient voices no new complaints Patient has not developed/ reported any new Medical issues / Dx or acute changes.- since last follow-up call for most recent  Hospital stay     1/3-1/5 / 2025  He is doing great, almost back to usual routine. Has no complaints.He reported feeling groggy initially but this has resolved  Had follow-up appt with PCP with no changes , some labs pending.SABRA He completed his ABT 01/31/23. He  reports to adverse effects. He is having Reg BM's His intake has improved and he is following Heart Healthy Carb modified diet. No cough or s/s infection. No abdominal pain or discomfort  Medication reconciliation / review completed based on most recent discharge summary and EHR medication list. Confirmed patient is taking all newly prescribed medications as instructed and is aware of any changes to and / or dosage adjustments to medication regimen. Patient denies questions at this time  and reports no barriers to medication adherence  Patient educated on red flag s/s to watch for and was encouraged to report, any changes in baseline or  medication regimen,  changes in health status  /  well-being, safety concerns  or any new unmanaged side effects or symptoms not relieved with interventions  to PCP and / or the  [facility] Case Management team           SDOH Interventions    Flowsheet Row Telephone from 01/26/2023 in [place]  POPULATION HEALTH DEPARTMENT Clinical Support from 03/02/2022 in [facility]  SDOH Interventions    Food Insecurity Interventions Intervention Not Indicated --  Housing Interventions Intervention Not Indicated --  Transportation Interventions Intervention Not Indicated, Patient Resources (Friends/Family) Intervention Not Indicated  Utilities Interventions Intervention Not Indicated --  Alcohol Usage Interventions -- Intervention Not Indicated (Score <7)  Financial Strain Interventions -- Intervention Not Indicated  Physical Activity Interventions -- Intervention Not Indicated  Stress Interventions -- Intervention Not Indicated  Social Connections Interventions Intervention Not Indicated Intervention Not Indicated        Goals Addressed             This Visit's Progress    TOC Care Plan       Current Barriers:  Chronic Disease Management support and education needs related to s/p Pneumonia    RNCM Clinical Goal(s):  Patient will work with the Care Management team over the next 30 days to address Transition of Care Barriers: Medication Management Provider appointments take all medications exactly as prescribed and will call provider for medication related questions as evidenced by no missed medication doses , completing ABT  attend all scheduled medical appointments: with [facility] and surgical follow-up scheduled 02/07/23 as evidenced by no missed appointments   through collaboration with RN Care manager, provider, and care team.   Interventions: Evaluation of current treatment plan related to  self management and patient's adherence to plan as established by provider  Transitions of Care:  Goal on  track:  Yes. Doctor Visits  - discussed the importance of doctor visits Post discharge activity limitations prescribed by provider reviewed Post-op wound/incision care reviewed with patient/caregiver Reviewed Signs and symptoms of infection  Patient Goals/Self-Care  Activities: Participate in Transition of Care Program/Attend TOC scheduled calls Take all medications as prescribed Attend all scheduled provider appointments Call pharmacy for medication refills 3-7 days in advance of running out of medications Attend church or other social activities Perform all self care activities independently  Perform IADL's (shopping, preparing meals, housekeeping, managing finances) independently Call provider office for new concerns or questions   Follow Up Plan:  Telephone follow up appointment with care management team member scheduled for:  02/08/23 11:00am  The patient has been provided with contact information for the care management team and has been advised to call with any health related questions or concerns.          Plan:  Routine follow-up and on-going assessment evaluation and education of disease processes, recommended interventions for both chronic and acute medical conditions , will occur during each weekly visit along with ongoing review of symptoms ,medication reviews and reconciliation. Any updates , inconsistencies, discrepancies or acute care concerns will be addressed and routed to the correct Practitioner if indicated   Based on current information and Insurance plan -Reviewed benefits available to patient, including details about eligibility options for care if any area of needs were identified.  Reviewed patients ability to access and / or navigating the benefits system..Amb Referral made if indicted , refer to orders section of note for details   Please refer to Care Plan for goals and interventions -Effectiveness of interventions, symptom management and outcomes will be evaluated  weekly during [facility] 30-day Program Outreach calls  . Any necessary  changes and updates to Care Plan will be completed episodically    Reviewed goals for care Patient verbalizes understanding of instructions and care plan provided. Patient was encouraged to make  informed decisions about their care, actively participate in managing their health condition, and implement lifestyle changes as needed to promote independence and self-management of health care    The patient has been provided with contact information for the care management team and has been advised to call with any health-related questions or concerns.   The patient has been provided with contact information for the care management team and has been advised to call with any health related questions or concerns.   Bari Mayans , BSN, RN Care Management Coordinator [place]   [facility] [email or web address] Direct Dial: [phone]

## 2023-01-30 NOTE — Patient Instructions (Signed)
 Visit Information  Thank you for taking time to visit with me today. Please don't hesitate to contact me if I can be of assistance to you before our next scheduled telephone appointment.  Our next appointment is by telephone on 02/08/23 at 11am   Following is a copy of your care plan:   Goals Addressed             This Visit's Progress    TOC Care Plan       Current Barriers:  Chronic Disease Management support and education needs related to s/p Pneumonia    RNCM Clinical Goal(s):  Patient will work with the Care Management team over the next 30 days to address Transition of Care Barriers: Medication Management Provider appointments take all medications exactly as prescribed and will call provider for medication related questions as evidenced by no missed medication doses , completing ABT  attend all scheduled medical appointments: with [facility] and surgical follow-up scheduled 02/07/23 as evidenced by no missed appointments   through collaboration with RN Care manager, provider, and care team.   Interventions: Evaluation of current treatment plan related to  self management and patient's adherence to plan as established by provider  Transitions of Care:  Goal on track:  Yes. Doctor Visits  - discussed the importance of doctor visits Post discharge activity limitations prescribed by provider reviewed Post-op wound/incision care reviewed with patient/caregiver Reviewed Signs and symptoms of infection  Patient Goals/Self-Care Activities: Participate in Transition of Care Program/Attend TOC scheduled calls Take all medications as prescribed Attend all scheduled provider appointments Call pharmacy for medication refills 3-7 days in advance of running out of medications Attend church or other social activities Perform all self care activities independently  Perform IADL's (shopping, preparing meals, housekeeping, managing finances) independently Call provider office for new concerns or  questions   Follow Up Plan:  Telephone follow up appointment with care management team member scheduled for:  02/08/23 11:00am  The patient has been provided with contact information for the care management team and has been advised to call with any health related questions or concerns.           Reviewed goals for care Patient/ Caregiver verbalizes understanding of instructions with the plan of care . The  Patient / Caregiver was encouraged to make informed decisions about care, actively participate in managing health conditions, and implement lifestyle changes as needed to promote independence and self-management of healthcare. SDOH screenings have been completed and addressed if indicted There are no reported barriers to care.    Follow-up Plan [facility] Case Management Nurse will provide follow-up and on-going assessment ,evaluation and education of disease processes, recommended interventions for both chronic and acute medical conditions ,  along with ongoing review of symptoms ,medication reviews / reconciliation during each weekly call . Any updates , inconsistencies, discrepancies or acute care concerns will be addressed and routed to the correct Practitioner if indicated   [facility]  Please call the care guide team at [phone]  if you need to cancel or reschedule your appointment . For scheduled calls -Three attempts will be made to reach you -if the scheduled call is missed or  we are unable to reach the you after 3 attempts no additional outreach attempts will be made and the TOC follow-up will be closed .   If you need to speak to a Nurse you may  call me directly at the number below or if I am unavailable,and  your need  is urgent  please call the main [facility] number at [phone] and ask to speak with one of the [facility] ( Transition of Care )  Nurses  .                                                                               Additionally, If you experience  worsening of your symptoms, develop shortness of breath, If you are experiencing a medical emergency,  develop suicidal or homicidal thoughts you must seek medical attention immediately by calling 911 or report to your local emergency department or urgent care.   If you have a non-emergency medical problem during routine business hours, please contact your provider's office and ask to speak with a nurse.       Please take the time to read instructions/literature along with the possible adverse reactions/side effects for all the Medicines that have been prescribed to you. Only take newly prescribed  Medications after you have completely understood and accept all the possible adverse reactions/side effects.   Do not take more than prescribed Medications for  Pain, Sleep and Anxiety. Do not drive when taking Pain medications or sleep aid/ insomnia  medications It is not advisable to combine anxiety, sleep and pain medications without talking with your primary care practitioner    If you are experiencing a Mental Health or Behavioral Health Crisis or need someone to talk to Please call the Suicide and Crisis Lifeline: 57 You may also call the [place]  National Suicide Prevention Lifeline: 628 392 4249 or TTY: 6106802322 TTY 9340543443) to talk to a trained counselor.  You may call the Behavioral Health Crisis Line at 514-233-2570, at any time, 24 hours a day, 7 days a week- however If you are in danger or need immediate medical attention, call 911.   If you would like help to quit smoking, call 1-800-QUIT-NOW ( 757 364 4352) OR Espaol: 1-855-Djelo-Ya (8-144-664-6430) o para ms informacin haga clic aqu or Text READY to 799-599 to register via text.   Bari Mayans , BSN, RN Care Management Coordinator [place]   [facility] [email or web address] Direct Dial: [phone]

## 2023-02-08 ENCOUNTER — Other Ambulatory Visit: Payer: Self-pay

## 2023-02-08 NOTE — Patient Outreach (Signed)
Care Management  Transitions of Care Program Transitions of Care Post-discharge week 3   02/08/2023 Name: Blake Rivera MRN: [id number] DOB: 1934-06-22  Subjective: [name] is a 89 y.o. year old male who is a primary care patient of Margarita Mail, DO. The Care Management team Engaged with patient Engaged with patient by telephone to assess and address transitions of care needs.   Consent to Services:  Patient was given information about care management services, agreed to services, and gave verbal consent to participate.   Assessment:     Patient/ Caregiver  voices no new complaints or concerns  and has not developed/ reported any new medical issues / Dx or acute changes. - since last follow-up call for most recent  Hospital stay     1/3-1/8 / 2025 He is doing very well. Seen by surgeon for post op follow-up 1/22, with no changes.He denies any abd pain or discomfort. Following carb modified diet for DM control. Medication reconciliation/ review completed based on most recent medication list in EHR; and in review of recent Provider follow-up appointments- confirmed patient obtained / is taking all newly prescribed medications as instructed and is aware of any changes to previous medications regimen including dosage adjustments. Patient self-manages medications; denies questions/ concerns or barriers to medication adherence at this time.  Patient / Caregiver educated on red flag s/s to watch for and was encouraged to report, any changes in baseline or  medication regimen,  changes in health status  /  well-being, safety concerns  or any new unmanaged side effects or symptoms not relieved with interventions  to PCP and / or the  [facility] Case Management team         SDOH Interventions    Flowsheet Row Telephone from 01/26/2023 in [place] POPULATION HEALTH DEPARTMENT Clinical Support from 03/02/2022 in [facility]  SDOH Interventions    Food  Insecurity Interventions Intervention Not Indicated --  Housing Interventions Intervention Not Indicated --  Transportation Interventions Intervention Not Indicated, Patient Resources (Friends/Family) Intervention Not Indicated  Utilities Interventions Intervention Not Indicated --  Alcohol Usage Interventions -- Intervention Not Indicated (Score <7)  Financial Strain Interventions -- Intervention Not Indicated  Physical Activity Interventions -- Intervention Not Indicated  Stress Interventions -- Intervention Not Indicated  Social Connections Interventions Intervention Not Indicated Intervention Not Indicated        Goals Addressed             This Visit's Progress    TOC Care Plan       Current Barriers:  Chronic Disease Management support and education needs related to s/p Pneumonia    RNCM Clinical Goal(s):  Patient will work with the Care Management team over the next 30 days to address Transition of Care Barriers: Medication Management Provider appointments take all medications exactly as prescribed and will call provider for medication related questions as evidenced by no missed medication doses , completing ABT  attend all scheduled medical appointments: with [facility] and surgical follow-up scheduled 02/07/23 and completed as evidenced by no missed appointments   through collaboration with RN Care manager, provider, and care team.   Interventions: Evaluation of current treatment plan related to  self management and patient's adherence to plan as established by provider  Transitions of Care:  Goal on track:  Yes. Doctor Visits  - discussed the importance of doctor visits Post discharge activity limitations prescribed by provider reviewed Post-op wound/incision care reviewed with patient/caregiver-area healed Reviewed Signs  and symptoms of infection  Patient Goals/Self-Care Activities: Participate in Transition of Care Program/Attend TOC scheduled calls Take all medications as  prescribed Attend all scheduled provider appointments Call pharmacy for medication refills 3-7 days in advance of running out of medications Attend church or other social activities Perform all self care activities independently  Perform IADL's (shopping, preparing meals, housekeeping, managing finances) independently Call provider office for new concerns or questions   Follow Up Plan:  Telephone follow up appointment with care management team member scheduled for:  02/14/23 1:00pm  The patient has been provided with contact information for the care management team and has been advised to call with any health related questions or concerns.          Plan:  Routine follow-up and on-going assessment evaluation and education of disease processes, recommended interventions for both chronic and acute medical conditions , will occur during each weekly visit along with ongoing review of symptoms ,medication reviews and reconciliation. Any updates , inconsistencies, discrepancies or acute care concerns will be addressed and routed to the correct Practitioner if indicated   Based on current information and Insurance plan -Reviewed benefits available to patient, including details about eligibility options for care if any area of needs were identified.  Reviewed patients ability to access and / or navigating the benefits system..Amb Referral made if indicted , refer to orders section of note for details   Please refer to Care Plan for goals and interventions -Effectiveness of interventions, symptom management and outcomes will be evaluated  weekly during [facility] 30-day Program Outreach calls  . Any necessary  changes and updates to Care Plan will be completed episodically    Reviewed goals for care Patient verbalizes understanding of instructions and care plan provided. Patient was encouraged to make informed decisions about their care, actively participate in managing their health condition, and implement  lifestyle changes as needed to promote independence and self-management of health care   The patient has been provided with contact information for the care management team and has been advised to call with any health-related questions or concerns. Follow up as indicated with Care Team , or sooner should any new problems arise.      Susa Loffler , BSN, RN [place]   [facility]-Population Health RN Care Manager Direct Dial [phone] Fax  Website: [email or web address]

## 2023-02-14 ENCOUNTER — Other Ambulatory Visit: Payer: Self-pay

## 2023-02-14 NOTE — Patient Outreach (Signed)
Care Management  Transitions of Care Program Transitions of Care Post-discharge week 4   02/14/2023 Name: Blake Rivera MRN: [id number] DOB: April 08, 1934  Subjective: Blake Rivera is a 89 y.o. year old male who is a primary care patient of Blake Mail, DO. The Care Management team Engaged with patient Engaged with patient by telephone to assess and address transitions of care needs.   Consent to Services:  Patient was given information about care management services, agreed to services, and gave verbal consent to participate.   Assessment:     Patient/ Caregiver  voices no new complaints or concerns  and has not developed/ reported any new medical issues / Dx or acute changes. - since last follow-up call for most recent  Hospital stay   01/19/23-1/8 / 2025 Spoke with spouse He is doing great . He completed all follow up appointments and is back to usual routine  Medication reconciliation/ review completed based on most recent medication list in EHR; and in review of recent Provider follow-up appointments- confirmed patient obtained / is taking all newly prescribed medications as instructed and is aware of any changes to previous medications regimen including dosage adjustments. Patient self-Caregiver manages medications; denies questions/ concerns or barriers to medication adherence at this time  Patient / Caregiver educated on red flag s/s to watch for based on current discharge diagnosis and was encouraged to report, any changes in baseline or  medication regimen,   or any new unmanaged side effects or symptoms not relieved with interventions  to PCP and / or the  VBCI Case Management team .        SDOH Interventions    Flowsheet Row Telephone from 01/26/2023 in [place] POPULATION HEALTH DEPARTMENT Clinical Support from 03/02/2022 in [facility]  SDOH Interventions    Food Insecurity Interventions Intervention Not Indicated --  Housing  Interventions Intervention Not Indicated --  Transportation Interventions Intervention Not Indicated, Patient Resources (Friends/Family) Intervention Not Indicated  Utilities Interventions Intervention Not Indicated --  Alcohol Usage Interventions -- Intervention Not Indicated (Score <7)  Financial Strain Interventions -- Intervention Not Indicated  Physical Activity Interventions -- Intervention Not Indicated  Stress Interventions -- Intervention Not Indicated  Social Connections Interventions Intervention Not Indicated Intervention Not Indicated        Goals Addressed             This Visit's Progress    COMPLETED: TOC Care Plan       Current Barriers:  Chronic Disease Management support and education needs related to s/p Pneumonia    RNCM Clinical Goal(s):  Patient will work with the Care Management team over the next 30 days to address Transition of Care Barriers: Medication Management Provider appointments take all medications exactly as prescribed and will call provider for medication related questions as evidenced by no missed medication doses , completing ABT  attend all scheduled medical appointments: with [facility] and surgical follow-up scheduled 02/07/23 and completed as evidenced by no missed appointments   through collaboration with RN Care manager, provider, and care team.   Interventions: Evaluation of current treatment plan related to  self management and patient's adherence to plan as established by provider  Transitions of Care:  Goal Met. Doctor Visits  - discussed the importance of doctor visits Post discharge activity limitations prescribed by provider reviewed Post-op wound/incision care reviewed with patient/caregiver-area healed Reviewed Signs and symptoms of infection  Patient Goals/Self-Care Activities: Participate in Transition of Care Program/Attend TOC scheduled  calls Take all medications as prescribed Attend all scheduled provider appointments Call  pharmacy for medication refills 3-7 days in advance of running out of medications Attend church or other social activities Perform all self care activities independently  Perform IADL's (shopping, preparing meals, housekeeping, managing finances) independently Call provider office for new concerns or questions   Follow Up Plan:  The patient has been provided with contact information for the care management team and has been advised to call with any health related questions or concerns.  No further follow up required: patient is back to usual routine no acute needs            Plan:  Spoke with patient spouse He is back to usual routine and out on golf course today  The patient has completed the TOC Program.and no additional call are needed at this time  Condition is stable No further acute needs identified at this time. Chronic conditions and ongoing care is  managed thru collaboration with  PCP,  Specialists and additional Healthcare Providers if indicated . Patient verbalized understanding of ongoing plan of care.  SDOH needs have been screened and interventions provided if identified.  Reviewed current home medications -- provided education as needed.  Previously discussed rationale of use, how/when to take medications. Patient is aware of potential side effects, and was encouraged to notify PCP for any changes in condition or signs / symptoms not relieved  with interventions.   Patient will call 911 for Medical Emergencies or Life -Threatening or report to a local emergency department or urgent care.   Patient was encouraged to Contact PCP  with any questions or concerns regarding ongoing  medical care, any  difficulty obtaining or picking up  prescriptions, any  changes or  worsening in  condition including signs / symptoms not relieved  with interventions Follow up as indicated with the  Care Team , or sooner should any new problems arise.   Patient had no additional questions or concerns  at this time. Current needs addressed.    The patient has been provided with contact information for the care management team and has been advised to call with any health related questions or concerns.   Susa Loffler , BSN, RN [facility]   VBCI-Population Health RN Care Manager Direct Dial [phone]  Fax: [phone] Website: [email or web address]

## 2023-03-05 ENCOUNTER — Telehealth: Payer: Self-pay

## 2023-03-05 NOTE — Telephone Encounter (Signed)
 Copied from CRM [id number]. Topic: Appointment Scheduling - Scheduling Inquiry for Clinic >> Mar 05, 2023  8:44 AM Haroldine Laws wrote: Reason for CRM: pt would like to reschedule the medicare wellness.  He said can 1130.  The system would not let me reschedule.  [phone]

## 2023-03-05 NOTE — Telephone Encounter (Signed)
 Copied from CRM [id number]. Topic: Appointment Scheduling - Scheduling Inquiry for Clinic >> Mar 05, 2023  8:44 AM Blake Rivera wrote: Reason for CRM: pt would like to reschedule the medicare wellness.  He said can 1130.  The system would not let me reschedule.  [phone]

## 2023-03-06 NOTE — Telephone Encounter (Signed)
 I spoke to patient and he said he'll keep his appointment on 03/15/2023 at 1:10 in office.

## 2023-03-15 ENCOUNTER — Ambulatory Visit: Payer: Medicare Other

## 2023-03-15 VITALS — BP 124/70 | Ht 72.0 in | Wt 212.1 lb

## 2023-03-15 DIAGNOSIS — Z0000 Encounter for general adult medical examination without abnormal findings: Secondary | ICD-10-CM | POA: Diagnosis not present

## 2023-03-15 NOTE — Progress Notes (Signed)
 Subjective:   Blake Rivera is a 89 y.o. who presents for a Medicare Wellness preventive visit.  Visit Complete: In person   AWV Questionnaire: No: Patient Medicare AWV questionnaire was not completed prior to this visit.  Cardiac Risk Factors include: advanced age (>76men, >16 women);diabetes mellitus;dyslipidemia;hypertension;male gender     Objective:    Today's Vitals   03/15/23 1307  BP: 124/70  Weight: 212 lb 1.6 oz (96.2 kg)  Height: 6' (1.829 m)   Body mass index is 28.77 kg/m.     03/15/2023    1:21 PM 01/22/2023    1:24 PM 01/21/2023    9:08 PM 01/19/2023    2:15 PM 03/02/2022    9:07 AM 01/27/2020    2:20 PM 01/23/2019   10:57 AM  Advanced Directives  Does Patient Have a Medical Advance Directive? No No  No Yes Yes Yes  Type of Careers adviser;Living will Healthcare Power of [place];Living will  Copy of Healthcare Power of Attorney in Chart?      No - copy requested No - copy requested  Would patient like information on creating a medical advance directive? No - Patient declined No - Patient declined No - Patient declined        Current Medications (verified) Outpatient Encounter Medications as of 03/15/2023  Medication Sig   amLODipine (NORVASC) 10 MG tablet TAKE 1 TABLET BY MOUTH EVERY DAY   atorvastatin (LIPITOR) 10 MG tablet Take 1 tablet (10 mg total) by mouth daily.   ibuprofen (ADVIL) 400 MG tablet Take 1 tablet (400 mg total) by mouth every 8 (eight) hours as needed for mild pain (pain score 1-3) or moderate pain (pain score 4-6).   tamsulosin (FLOMAX) 0.4 MG CAPS capsule Take 2 capsules (0.8 mg total) by mouth daily.   No facility-administered encounter medications on file as of 03/15/2023.    Allergies (verified) Lisinopril, Omacor  [omega-3-acid ethyl esters (fish)], and Zocor  [simvastatin]   History: Past Medical History:  Diagnosis Date   Diverticulitis of colon    ED (erectile dysfunction)     Hyperglycemia    Hypertension    Vitamin D deficiency    Past Surgical History:  Procedure Laterality Date   ERCP N/A 01/22/2023   Procedure: ENDOSCOPIC RETROGRADE CHOLANGIOPANCREATOGRAPHY (ERCP);  Surgeon: Midge Minium, MD;  Location[facility] ENDOSCOPY;  Service: Endoscopy;  Laterality: N/A;   HERNIA REPAIR     HOT HEMOSTASIS  01/22/2023   Procedure: HOT HEMOSTASIS (ARGON PLASMA COAGULATION/BICAP);  Surgeon: Midge Minium, MD;  Location: [facility] ENDOSCOPY;  Service: Endoscopy;;   REMOVAL OF STONES  01/22/2023   Procedure: REMOVAL OF STONES;  Surgeon: Midge Minium, MD;  Location: [facility] ENDOSCOPY;  Service: Endoscopy;;   ROTATOR CUFF REPAIR  03/17/2018   SPHINCTEROTOMY  01/22/2023   Procedure: Dennison Mascot;  Surgeon: Midge Minium, MD;  Location: [facility] ENDOSCOPY;  Service: Endoscopy;;   Family History  Problem Relation Age of Onset   Heart disease Mother    Aneurysm Mother    Heart disease Brother    Emphysema Father    Healthy Sister    Healthy Brother    Healthy Brother    Heart disease Sister    Social History   Socioeconomic History   Marital status: Married    Spouse name: Not on file   Number of children: 2   Years of education: Not on file   Highest education level: Doctorate  Occupational History  Occupation: Retired  Tobacco Use   Smoking status: Former    Current packs/day: 0.00    Average packs/day: 0.3 packs/day for 25.0 years (6.3 ttl pk-yrs)    Types: Cigarettes    Start date: 05/17/1989    Quit date: 05/18/2014    Years since quitting: 8.8   Smokeless tobacco: Never   Tobacco comments:    Former smoker - no need to provide with smoking cessation materials  Vaping Use   Vaping status: Never Used  Substance and Sexual Activity   Alcohol use: Yes    Alcohol/week: 1.0 standard drink of alcohol    Types: 1 Shots of liquor per week    Comment: moderate - 1 whiskey shot per day   Drug use: No   Sexual activity: Yes  Other Topics Concern   Not on file  Social History  Narrative   Not on file   Social Drivers of Health   Financial Resource Strain: Low Risk  (03/15/2023)   Overall Financial Resource Strain (CARDIA)    Difficulty of Paying Living Expenses: Not hard at all  Food Insecurity: No Food Insecurity (03/15/2023)   Hunger Vital Sign    Worried About Running Out of Food in the Last Year: Never true    Ran Out of Food in the Last Year: Never true  Transportation Needs: No Transportation Needs (03/15/2023)   PRAPARE - Administrator, Civil Service (Medical): No    Lack of Transportation (Non-Medical): No  Physical Activity: Sufficiently Active (03/15/2023)   Exercise Vital Sign    Days of Exercise per Week: 3 days    Minutes of Exercise per Session: 60 min  Stress: No Stress Concern Present (03/15/2023)   [employer] of Occupational Health - Occupational Stress Questionnaire    Feeling of Stress : Not at all  Social Connections: Socially Integrated (03/15/2023)   Social Connection and Isolation Panel [NHANES]    Frequency of Communication with Friends and Family: More than three times a week    Frequency of Social Gatherings with Friends and Family: Three times a week    Attends Religious Services: More than 4 times per year    Active Member of Clubs or Organizations: Yes    Attends Engineer, structural: More than 4 times per year    Marital Status: Married    Tobacco Counseling Counseling given: Not Answered Tobacco comments: Former smoker - no need to provide with smoking cessation materials    Clinical Intake:  Pre-visit preparation completed: Yes  Pain : No/denies pain     BMI - recorded: 28.77 Nutritional Status: BMI 25 -29 Overweight Nutritional Risks: None Diabetes: Yes CBG done?: No Did pt. bring in CBG monitor from home?: No  How often do you need to have someone help you when you read instructions, pamphlets, or other written materials from your doctor or pharmacy?: 1 - Never  Interpreter  Needed?: No  Information entered by :: Kennedy Bucker, LPN   Activities of Daily Living     03/15/2023    1:22 PM 01/20/2023    5:00 PM  In your present state of health, do you have any difficulty performing the following activities:  Hearing? 0 0  Vision? 0 0  Difficulty concentrating or making decisions? 0 0  Walking or climbing stairs? 0   Dressing or bathing? 0   Doing errands, shopping? 0 0  Preparing Food and eating ? N   Using the Toilet? N   In  the past six months, have you accidently leaked urine? N   Do you have problems with loss of bowel control? N   Managing your Medications? N   Managing your Finances? N   Housekeeping or managing your Housekeeping? N     Patient Care Team: Margarita Mail, DO as PCP - General (Internal Medicine) Dingeldein, Viviann Spare, MD (Ophthalmology)  Indicate any recent Medical Services you may have received from other than [facility] providers in the past year (date may be approximate).     Assessment:   This is a routine wellness examination for [place].  Hearing/Vision screen Hearing Screening - Comments:: NO AIDS Vision Screening - Comments:: READERS- DR.DINGELDEIN   Goals Addressed             This Visit's Progress    DIET - EAT MORE FRUITS AND VEGETABLES         Depression Screen     03/15/2023    1:19 PM 01/18/2023    8:49 AM 07/27/2022    8:24 AM 03/02/2022    8:49 AM 01/26/2022    8:54 AM 10/14/2021    2:03 PM 07/26/2021    8:22 AM  PHQ 2/9 Scores  PHQ - 2 Score 0 0 0 0 0 0 0  PHQ- 9 Score 0  0 0 0 0 0    Fall Risk     03/15/2023    1:22 PM 01/18/2023    8:48 AM 07/27/2022    8:24 AM 03/02/2022    8:47 AM 01/26/2022    8:52 AM  Fall Risk   Falls in the past year? 0 0 0 0 0  Number falls in past yr: 0 0 0 0 0  Injury with Fall? 0 0 0 0 0  Risk for fall due to : No Fall Risks No Fall Risks  No Fall Risks   Follow up Falls prevention discussed;Falls evaluation completed Falls prevention discussed;Education provided;Falls  evaluation completed  Education provided;Falls prevention discussed     MEDICARE RISK AT HOME:  Medicare Risk at Home Any stairs in or around the home?: Yes If so, are there any without handrails?: No Home free of loose throw rugs in walkways, pet beds, electrical cords, etc?: Yes Adequate lighting in your home to reduce risk of falls?: Yes Life alert?: No Use of a cane, walker or w/c?: No Grab bars in the bathroom?: Yes Shower chair or bench in shower?: No Elevated toilet seat or a handicapped toilet?: Yes  TIMED UP AND GO:  Was the test performed?  Yes  Length of time to ambulate 10 feet: 4 sec Gait steady and fast without use of assistive device  Cognitive Function: 6CIT completed        03/15/2023    1:24 PM 03/02/2022    9:08 AM 12/25/2017    8:48 AM 12/22/2016   11:01 AM  6CIT Screen  What Year? 0 points 0 points 0 points 0 points  What month? 0 points 0 points 0 points 0 points  What time? 0 points 0 points 0 points 0 points  Count back from 20 0 points 0 points 0 points 0 points  Months in reverse 0 points 0 points 0 points 0 points  Repeat phrase 0 points -- 2 points 0 points  Total Score 0 points  2 points 0 points    Immunizations Immunization History  Administered Date(s) Administered   Fluad Quad(high Dose 65+) 10/30/2018, 10/09/2019, 10/26/2020, 10/27/2021   Influenza Split 12/02/2013   Influenza,  High Dose Seasonal PF 10/24/2016, 10/16/2017   Influenza, Seasonal, Injecte, Preservative Fre 11/11/2012   Influenza-Unspecified 12/06/2014, 10/25/2015, 11/22/2022   Moderna Covid-19 Fall Seasonal Vaccine 25yrs & older 10/11/2022   Moderna SARS-COV2 Booster Vaccination 11/08/2021   PFIZER(Purple Top)SARS-COV-2 Vaccination 01/23/2019, 02/15/2019   PNEUMOCOCCAL CONJUGATE-20 06/20/2021   RSV,unspecified 12/16/2021   Tdap 02/03/2022   Zoster Recombinant(Shingrix) 08/18/2020, 04/16/2021    Screening Tests Health Maintenance  Topic Date Due   OPHTHALMOLOGY  EXAM  Never done   COVID-19 Vaccine (4 - 2024-25 season) 12/06/2022   FOOT EXAM  07/27/2023   HEMOGLOBIN A1C  07/29/2023   Medicare Annual Wellness (AWV)  03/14/2024   Pneumonia Vaccine 75+ Years old  Completed   INFLUENZA VACCINE  Completed   Zoster Vaccines- Shingrix  Completed   HPV VACCINES  Aged Out   DTaP/Tdap/Td  Discontinued    Health Maintenance  Health Maintenance Due  Topic Date Due   OPHTHALMOLOGY EXAM  Never done   COVID-19 Vaccine (4 - 2024-25 season) 12/06/2022   Health Maintenance Items Addressed: UP TO DATE  Additional Screening:  Vision Screening: Recommended annual ophthalmology exams for early detection of glaucoma and other disorders of the eye.  Dental Screening: Recommended annual dental exams for proper oral hygiene  Community Resource Referral / Chronic Care Management: CRR required this visit?  No   CCM required this visit?  No     Plan:     I have personally reviewed and noted the following in the patient's chart:   Medical and social history Use of alcohol, tobacco or illicit drugs  Current medications and supplements including opioid prescriptions. Patient is not currently taking opioid prescriptions. Functional ability and status Nutritional status Physical activity Advanced directives List of other physicians Hospitalizations, surgeries, and ER visits in previous 12 months Vitals Screenings to include cognitive, depression, and falls Referrals and appointments  In addition, I have reviewed and discussed with patient certain preventive protocols, quality metrics, and best practice recommendations. A written personalized care plan for preventive services as well as general preventive health recommendations were provided to patient.     Hal Hope, LPN   1/61/0960   After Visit Summary: (In Person-Declined) Patient declined AVS at this time.  Notes: Nothing significant to report at this time.

## 2023-03-15 NOTE — Patient Instructions (Addendum)
 [name] , Thank you for taking time to come for your Medicare Wellness Visit. I appreciate your ongoing commitment to your health goals. Please review the following plan we discussed and let me know if I can assist you in the future.   Referrals/Orders/Follow-Ups/Clinician Recommendations: NONE  This is a list of the screening recommended for you and due dates:  Health Maintenance  Topic Date Due   Eye exam for diabetics  Never done   COVID-19 Vaccine (4 - 2024-25 season) 12/06/2022   Complete foot exam   07/27/2023   Hemoglobin A1C  07/29/2023   Medicare Annual Wellness Visit  03/14/2024   Pneumonia Vaccine  Completed   Flu Shot  Completed   Zoster (Shingles) Vaccine  Completed   HPV Vaccine  Aged Out   DTaP/Tdap/Td vaccine  Discontinued    Advanced directives: (ACP Link)Information on Advanced Care Planning can be found at [facility] [place] Advance Health Care Directives Advance Health Care Directives ([email or web address]   Next Medicare Annual Wellness Visit scheduled for next year: Yes   03/20/24 @ 1:50 PM IN PERSON

## 2023-03-24 ENCOUNTER — Other Ambulatory Visit: Payer: Self-pay | Admitting: Internal Medicine

## 2023-03-24 DIAGNOSIS — R351 Nocturia: Secondary | ICD-10-CM

## 2023-03-26 NOTE — Telephone Encounter (Signed)
 Requested Prescriptions  Pending Prescriptions Disp Refills   tamsulosin (FLOMAX) 0.4 MG CAPS capsule [Pharmacy Med Name: TAMSULOSIN HCL 0.4 MG CAPSULE] 180 capsule 1    Sig: TAKE 2 CAPSULES BY MOUTH EVERY DAY     Urology: Alpha-Adrenergic Blocker Passed - 03/26/2023  1:39 PM      Passed - PSA in normal range and within 360 days    PSA  Date Value Ref Range Status  01/29/2023 2.05 < OR = 4.00 ng/mL Final    Comment:    The total PSA value from this assay system is  standardized against the WHO standard. The test  result will be approximately 20% lower when compared  to the equimolar-standardized total PSA (Beckman  Coulter). Comparison of serial PSA results should be  interpreted with this fact in mind. . This test was performed using the Siemens  chemiluminescent method. Values obtained from  different assay methods cannot be used interchangeably. PSA levels, regardless of value, should not be interpreted as absolute evidence of the presence or absence of disease.    Prostate Specific Ag, Serum  Date Value Ref Range Status  11/25/2014 2.2 0.0 - 4.0 ng/mL Final    Comment:    Roche ECLIA methodology. According to the American Urological Association, Serum PSA should decrease and remain at undetectable levels after radical prostatectomy. The AUA defines biochemical recurrence as an initial PSA value 0.2 ng/mL or greater followed by a subsequent confirmatory PSA value 0.2 ng/mL or greater. Values obtained with different assay methods or kits cannot be used interchangeably. Results cannot be interpreted as absolute evidence of the presence or absence of malignant disease.          Passed - Last BP in normal range    BP Readings from Last 1 Encounters:  03/15/23 124/70         Passed - Valid encounter within last 12 months    Recent Outpatient Visits           1 month ago Hospital discharge follow-up   [facility] Margarita Mail, DO   2  months ago Dysuria   [facility] Cory, MD   8 months ago Type 2 diabetes mellitus with hyperglycemia, without long-term current use of insulin [facility])   [facility] Margarita Mail, DO   1 year ago Essential hypertension   [facility] Margarita Mail, DO   1 year ago Need for influenza vaccination   [facility] Margarita Mail, DO       Future Appointments             In 4 months Margarita Mail, DO [facility]

## 2023-07-31 ENCOUNTER — Other Ambulatory Visit: Payer: Self-pay

## 2023-07-31 ENCOUNTER — Ambulatory Visit: Payer: Self-pay | Admitting: Internal Medicine

## 2023-07-31 ENCOUNTER — Encounter: Payer: Self-pay | Admitting: Internal Medicine

## 2023-07-31 VITALS — BP 128/80 | HR 81 | Temp 97.7°F | Resp 16 | Ht 72.0 in | Wt 221.9 lb

## 2023-07-31 DIAGNOSIS — R351 Nocturia: Secondary | ICD-10-CM

## 2023-07-31 DIAGNOSIS — I10 Essential (primary) hypertension: Secondary | ICD-10-CM

## 2023-07-31 DIAGNOSIS — E1165 Type 2 diabetes mellitus with hyperglycemia: Secondary | ICD-10-CM

## 2023-07-31 DIAGNOSIS — J310 Chronic rhinitis: Secondary | ICD-10-CM | POA: Diagnosis not present

## 2023-07-31 DIAGNOSIS — N401 Enlarged prostate with lower urinary tract symptoms: Secondary | ICD-10-CM | POA: Diagnosis not present

## 2023-07-31 LAB — POCT GLYCOSYLATED HEMOGLOBIN (HGB A1C): Hemoglobin A1C: 6.2 % — AB (ref 4.0–5.6)

## 2023-07-31 MED ORDER — TAMSULOSIN HCL 0.4 MG PO CAPS: 0.8000 mg | ORAL_CAPSULE | Freq: Every day | ORAL | 1 refills | Status: AC

## 2023-07-31 MED ORDER — AMLODIPINE BESYLATE 10 MG PO TABS
10.0000 mg | ORAL_TABLET | Freq: Every day | ORAL | 1 refills | Status: DC
Start: 2023-07-31 — End: 2023-10-16

## 2023-07-31 NOTE — Progress Notes (Signed)
 Established Patient Office Visit  Subjective   Patient ID: Blake Rivera, male    DOB: 1934/02/10  Age: 89 y.o. MRN: [id number]  Chief Complaint  Patient presents with   Medical Management of Chronic Issues    6 month recheck    HPI Patient presenting for follow up on chronic medical conditions.  Overall he is doing well and has no questions or concerns today.  Discussed the use of AI scribe software for clinical note transcription with the patient, who gave verbal consent to proceed.  History of Present Illness Blake Rivera is an 89 year old male with diabetes and hypertension who presents for a routine check-up and medication refills.  He feels generally well with no specific complaints. His recent A1c level is 6.2, consistent with previous results. He experiences occasional morning lightheadedness that resolves with movement. Home blood pressure monitoring shows stable readings with occasional elevations, particularly when he has a slight headache.  Flomax  effectively manages urinary symptoms, with no frequent nighttime urination unless he consumes coffee. He typically goes to bed around 3 AM.  He has a runny nose while eating for about six months, with no specific food triggers and some sinus drainage. Current medications include Flomax  and blood pressure medication, with no new or worsening urinary symptoms.   Hypertension: -Medications: Amlodipine  10 mg. Has been stable on this medication for years.  -Has history of angioedema with Lisinopril in the past. -Patient is compliant with above medications and reports no side effects. -Checking BP at home (average): doesn't check  -Denies any SOB, CP, vision changes, LE edema or symptoms of hypotension. Sometimes feeling a little dizzy in the morning right when he first gets up but resolves as he starts to move around.   HLD: -Medications: Lipitor 10 mg, cod liver oil  -Patient is compliant with above medications and  reports no side effects.  -Last lipid panel: Lipid Panel     Component Value Date/Time   CHOL 134 01/29/2023 0923   CHOL 195 08/26/2014 1020   TRIG 73 01/29/2023 0923   HDL 38 (L) 01/29/2023 0923   HDL 50 08/26/2014 1020   CHOLHDL 3.5 01/29/2023 0923   VLDL 24 07/03/2016 1522   LDLCALC 81 01/29/2023 0923   LABVLDL 29 08/26/2014 1020   Diabetes, Type 2: -Last A1c 6.4% 1/25 -Medications: nothing - had started Metformin  at one point but never took it -Checking BG at home: no -Diet: working on decreasing sugars in the diet  -Eye exam: Has an eye doctor, will schedule diabetic eye exam -Foot exam: Due today -Microalbumin: Due today -Statin: yes -PNA vaccine: UTD -Denies symptoms of hypoglycemia, polyuria, polydipsia, numbness extremities, foot ulcers/trauma.   Nocturia/LUTS: -Currently on Flomax  0.8 mg for symptoms, taking right before bed -Currently to get up to urinate 2-3 times a night  -Denies dysuria, hematuria, does have slowness to initiate urination.  Health Maintenance: -Blood work UTD -Tdap UTD   Review of Systems  Constitutional:  Negative for chills and fever.  Eyes:  Negative for blurred vision.  Respiratory:  Negative for shortness of breath.   Cardiovascular:  Negative for chest pain.  Gastrointestinal:  Negative for abdominal pain.  Genitourinary:  Positive for frequency. Negative for dysuria, hematuria and urgency.  Neurological:  Negative for dizziness and headaches.      Objective:     BP 128/80 (Cuff Size: Large)   Pulse 81   Temp 97.7 F (36.5 C) (Oral)   Resp 16  Ht 6' (1.829 m)   Wt 221 lb 14.4 oz (100.7 kg)   SpO2 100%   BMI 30.10 kg/m  BP Readings from Last 3 Encounters:  07/31/23 128/80  03/15/23 124/70  01/29/23 128/76   Wt Readings from Last 3 Encounters:  07/31/23 221 lb 14.4 oz (100.7 kg)  03/15/23 212 lb 1.6 oz (96.2 kg)  01/29/23 212 lb 9.6 oz (96.4 kg)      Physical Exam Constitutional:      Appearance: Normal  appearance.  HENT:     Head: Normocephalic and atraumatic.  Eyes:     Conjunctiva/sclera: Conjunctivae normal.  Cardiovascular:     Rate and Rhythm: Normal rate and regular rhythm.     Pulses:          Dorsalis pedis pulses are 2+ on the right side and 2+ on the left side.  Pulmonary:     Effort: Pulmonary effort is normal.     Breath sounds: Normal breath sounds.  Musculoskeletal:     Right foot: Normal range of motion. No deformity, bunion, Charcot foot, foot drop or prominent metatarsal heads.     Left foot: Normal range of motion. No deformity, bunion, Charcot foot, foot drop or prominent metatarsal heads.  Feet:     Right foot:     Protective Sensation: 6 sites tested.  6 sites sensed.     Skin integrity: Dry skin present.     Toenail Condition: Right toenails are abnormally thick.     Left foot:     Protective Sensation: 6 sites tested.  6 sites sensed.     Skin integrity: Dry skin present.     Toenail Condition: Left toenails are abnormally thick.  Skin:    General: Skin is warm and dry.  Neurological:     General: No focal deficit present.     Mental Status: He is alert. Mental status is at baseline.  Psychiatric:        Mood and Affect: Mood normal.        Behavior: Behavior normal.      Results for orders placed or performed in visit on 07/31/23  POCT HgB A1C  Result Value Ref Range   Hemoglobin A1C 6.2 (A) 4.0 - 5.6 %   HbA1c POC (<> result, manual entry)     HbA1c, POC (prediabetic range)     HbA1c, POC (controlled diabetic range)      Last CBC Lab Results  Component Value Date   WBC 11.3 (H) 01/24/2023   HGB 13.3 01/24/2023   HCT 40.4 01/24/2023   MCV 84.7 01/24/2023   MCH 27.9 01/24/2023   RDW 14.4 01/24/2023   PLT 200 01/24/2023   Last metabolic panel Lab Results  Component Value Date   GLUCOSE 102 (H) 01/29/2023   NA 142 01/29/2023   K 4.5 01/29/2023   CL 104 01/29/2023   CO2 29 01/29/2023   BUN 12 01/29/2023   CREATININE 0.97  01/29/2023   EGFR 76 01/29/2023   CALCIUM  9.3 01/29/2023   PROT 6.5 01/29/2023   ALBUMIN 3.6 01/23/2023   LABGLOB 2.6 08/26/2014   AGRATIO 1.7 08/26/2014   BILITOT 0.6 01/29/2023   ALKPHOS 188 (H) 01/23/2023   AST 24 01/29/2023   ALT 35 01/29/2023   ANIONGAP 9 01/24/2023   Last lipids Lab Results  Component Value Date   CHOL 134 01/29/2023   HDL 38 (L) 01/29/2023   LDLCALC 81 01/29/2023   TRIG 73 01/29/2023   CHOLHDL 3.5  01/29/2023   Last hemoglobin A1c Lab Results  Component Value Date   HGBA1C 6.2 (A) 07/31/2023   Last thyroid  functions Lab Results  Component Value Date   TSH 2.86 05/21/2017   Last vitamin D  Lab Results  Component Value Date   VD25OH 21 (L) 05/21/2017   Last vitamin B12 and Folate Lab Results  Component Value Date   VITAMINB12 813 01/26/2022      The ASCVD Risk score (Arnett DK, et al., 2019) failed to calculate for the following reasons:   The 2019 ASCVD risk score is only valid for ages 34 to 66    Assessment & Plan:   Assessment & Plan Hypertension Blood pressure generally controlled, but occasional elevated readings and morning lightheadedness suggest excessive antihypertensive dose. - Monitor blood pressure at home, especially in the morning. - Continue Amlodipine  10 mg daily . - Educated on acclimating to standing to prevent falls.  Type 2 Diabetes Mellitus A1c well-controlled at 6.2. Due for foot exam to prevent complications. Referral to podiatrist for nail care planned. - Perform foot exam. - Refer to podiatrist for nail care due to thickened nails.  Benign Prostatic Hyperplasia (BPH) Urinary symptoms well-managed with Flomax . Advised to reduce evening fluid intake. - Advise reducing fluid intake after 8-9 PM.  Gustatory Rhinitis Intermittent rhinorrhea with eating, likely due to parasympathetic activation. Not allergy or life-threatening. - Consider using Claritin to manage symptoms.  General Health  Maintenance Overall health well-managed with controlled diabetes and hypertension. Routine lab work not needed currently. - Refill medications. - Schedule follow-up in six months for labs and routine check-up.  - POCT HgB A1C - Ambulatory referral to Podiatry - HM Diabetes Foot Exam - amLODipine  (NORVASC ) 10 MG tablet; Take 1 tablet (10 mg total) by mouth daily.  Dispense: 90 tablet; Refill: 1 - tamsulosin  (FLOMAX ) 0.4 MG CAPS capsule; Take 2 capsules (0.8 mg total) by mouth daily.  Dispense: 180 capsule; Refill: 1   Return in about 6 months (around 01/31/2024).    Sharyle Fischer, DO

## 2023-08-09 ENCOUNTER — Ambulatory Visit: Admitting: Podiatry

## 2023-08-09 ENCOUNTER — Encounter: Payer: Self-pay | Admitting: Podiatry

## 2023-08-09 DIAGNOSIS — E1165 Type 2 diabetes mellitus with hyperglycemia: Secondary | ICD-10-CM | POA: Diagnosis not present

## 2023-08-09 DIAGNOSIS — L84 Corns and callosities: Secondary | ICD-10-CM | POA: Diagnosis not present

## 2023-08-09 DIAGNOSIS — M2042 Other hammer toe(s) (acquired), left foot: Secondary | ICD-10-CM

## 2023-08-09 DIAGNOSIS — M2011 Hallux valgus (acquired), right foot: Secondary | ICD-10-CM | POA: Diagnosis not present

## 2023-08-09 DIAGNOSIS — E119 Type 2 diabetes mellitus without complications: Secondary | ICD-10-CM

## 2023-08-09 DIAGNOSIS — M79674 Pain in right toe(s): Secondary | ICD-10-CM | POA: Diagnosis not present

## 2023-08-09 DIAGNOSIS — B351 Tinea unguium: Secondary | ICD-10-CM | POA: Diagnosis not present

## 2023-08-09 DIAGNOSIS — M79675 Pain in left toe(s): Secondary | ICD-10-CM

## 2023-08-09 DIAGNOSIS — M2041 Other hammer toe(s) (acquired), right foot: Secondary | ICD-10-CM | POA: Diagnosis not present

## 2023-08-09 DIAGNOSIS — M2012 Hallux valgus (acquired), left foot: Secondary | ICD-10-CM

## 2023-08-09 NOTE — Progress Notes (Signed)
 Subjective: Blake Rivera presents today for diabetic foot evaluation.  Patient relates h/o diabetes. Diet controlled.  Patient denies any h/o foot wounds.  PCP is Blake Fend, DO , and last visit was July 31, 2023.  Past Medical History:  Diagnosis Date   Diverticulitis of colon    ED (erectile dysfunction)    Hyperglycemia    Hypertension    Vitamin D  deficiency     Patient Active Problem List   Diagnosis Date Noted   Simple renal cyst 01/29/2023   Type 2 diabetes mellitus with hyperglycemia, without long-term current use of insulin  (HCC) 01/29/2023   Choledocholithiasis 01/22/2023   BPH (benign prostatic hyperplasia) 01/22/2023   Bacteremia due to Klebsiella pneumoniae 01/22/2023   Prediabetes 12/23/2019   Class 1 obesity due to excess calories with serious comorbidity and body mass index (BMI) of 31.0 to 31.9 in adult 08/21/2019   Nocturia 08/21/2019   Rotator cuff tendinitis, left 02/08/2018   Traumatic complete tear of left rotator cuff 02/08/2018   Localized, primary osteoarthritis 12/25/2017   Lipoma of scalp 12/03/2017   Obesity (BMI 30.0-34.9) 12/03/2017   CKD (chronic kidney disease) stage 2, GFR 60-89 ml/min 12/03/2017   Dyslipidemia 11/25/2014   Abnormal ECG 07/16/2014   History of colitis 07/16/2014   ED (erectile dysfunction) 07/16/2014   Anterior knee pain 07/16/2014   Arthralgia of shoulder 07/16/2014   Dyspnea 12/29/2013   Essential hypertension 12/29/2013    Past Surgical History:  Procedure Laterality Date   ERCP N/A 01/22/2023   Procedure: ENDOSCOPIC RETROGRADE CHOLANGIOPANCREATOGRAPHY (ERCP);  Surgeon: Blake Carmine, MD;  Location: [facility] ENDOSCOPY;  Service: Endoscopy;  Laterality: N/A;   HERNIA REPAIR     HOT HEMOSTASIS  01/22/2023   Procedure: HOT HEMOSTASIS (ARGON PLASMA COAGULATION/BICAP);  Surgeon: Blake Carmine, MD;  Location: [facility] ENDOSCOPY;  Service: Endoscopy;;   REMOVAL OF STONES  01/22/2023   Procedure: REMOVAL OF STONES;   Surgeon: Blake Carmine, MD;  Location: [facility] ENDOSCOPY;  Service: Endoscopy;;   ROTATOR CUFF REPAIR  03/17/2018   SPHINCTEROTOMY  01/22/2023   Procedure: ANNETT;  Surgeon: Blake Carmine, MD;  Location: [facility] ENDOSCOPY;  Service: Endoscopy;;    Current Outpatient Medications on File Prior to Visit  Medication Sig Dispense Refill   amLODipine  (NORVASC ) 10 MG tablet Take 1 tablet (10 mg total) by mouth daily. 90 tablet 1   atorvastatin  (LIPITOR) 10 MG tablet Take 1 tablet (10 mg total) by mouth daily. 90 tablet 3   ibuprofen  (ADVIL ) 400 MG tablet Take 1 tablet (400 mg total) by mouth every 8 (eight) hours as needed for mild pain (pain score 1-3) or moderate pain (pain score 4-6). 30 tablet 0   tamsulosin  (FLOMAX ) 0.4 MG CAPS capsule Take 2 capsules (0.8 mg total) by mouth daily. 180 capsule 1   No current facility-administered medications on file prior to visit.     Allergies  Allergen Reactions   Lisinopril     Angioedema   Omacor  [Omega-3-Acid Ethyl Esters (Fish)]     Other reaction(s): Rash   Zocor  [Simvastatin]     Muscle Pain    Social History   Occupational History   Occupation: Retired  Tobacco Use   Smoking status: Former    Current packs/day: 0.00    Average packs/day: 0.3 packs/day for 25.0 years (6.3 ttl pk-yrs)    Types: Cigarettes    Start date: 05/17/1989    Quit date: 05/18/2014    Years since quitting: 9.2   Smokeless tobacco: Never  Tobacco comments:    Former smoker - no need to provide with smoking cessation materials  Vaping Use   Vaping status: Never Used  Substance and Sexual Activity   Alcohol use: Yes    Alcohol/week: 1.0 standard drink of alcohol    Types: 1 Shots of liquor per week    Comment: moderate - 1 whiskey shot per day   Drug use: No   Sexual activity: Yes    Family History  Problem Relation Age of Onset   Heart disease Mother    Aneurysm Mother    Heart disease Brother    Emphysema Father    Healthy Sister    Healthy Brother     Healthy Brother    Heart disease Sister     Immunization History  Administered Date(s) Administered   Fluad Quad(high Dose 65+) 10/30/2018, 10/09/2019, 10/26/2020, 10/27/2021   Influenza Split 12/02/2013   Influenza, High Dose Seasonal PF 10/24/2016, 10/16/2017   Influenza, Seasonal, Injecte, Preservative Fre 11/11/2012   Influenza-Unspecified 12/06/2014, 10/25/2015, 11/22/2022   Moderna Covid-19 Fall Seasonal Vaccine 14yrs & older 10/11/2022   Moderna SARS-COV2 Booster Vaccination 11/08/2021   PFIZER(Purple Top)SARS-COV-2 Vaccination 01/23/2019, 02/15/2019   PNEUMOCOCCAL CONJUGATE-20 06/20/2021   RSV,unspecified 12/16/2021   Tdap 02/03/2022   Zoster Recombinant(Shingrix) 08/18/2020, 04/16/2021    Objective: There were no vitals filed for this visit.  Blake Rivera is a pleasant 89 y.o. male WD, WN in NAD. AAO X 3.   Diabetic foot exam was performed with the following findings:   Vascular Examination: Capillary refill time immediate b/l. Palpable pedal pulses. Pedal hair present b/l. No pain with calf compression b/l. Skin temperature gradient WNL b/l. No cyanosis or clubbing b/l. No ischemia or gangrene noted b/l. No edema noted b/l LE.  Neurological Examination: Sensation grossly intact b/l with 10 gram monofilament. Vibratory sensation intact b/l.   Dermatological Examination: Pedal skin with normal turgor, texture and tone b/l.  No open wounds. No interdigital macerations.   Toenails 1-5 b/l thick, discolored, elongated with subungual debris and pain on dorsal palpation.   Hyperkeratotic lesion(s) submet head 1 right foot.  No erythema, no edema, no drainage, no fluctuance.  Musculoskeletal Examination: Muscle strength 5/5 to all lower extremity muscle groups bilaterally. HAV with bunion deformity noted b/l LE. Hammertoe(s) bilateral 2nd toes.. No pain, crepitus or joint limitation noted with ROM b/l LE.  Patient ambulates independently without assistive  aids.  Radiographs: None      Lab Results  Component Value Date   HGBA1C 6.2 (A) 07/31/2023    Assessment: 1. Pain due to onychomycosis of toenails of both feet   2. Callus   3. Acquired hammertoes of both feet   4. Hallux valgus, acquired, bilateral   5. Type 2 diabetes mellitus with hyperglycemia, without long-term current use of insulin  (HCC)   6. Encounter for diabetic foot exam (HCC)      ADA Risk Categorization: Low Risk:  Patient has all of the following: Intact protective sensation No prior foot ulcer  No severe deformity Pedal pulses present  Plan: Diabetic foot examination performed today. All patient's and/or POA's questions/concerns addressed on today's visit. We discussed topical and oral treatment for onychomycosis. He has used [employer] in the past and may continue with this once daily. Toenails 1-5 debrided in length and girth without incident. Pared callus submet head 1 right foot with sterile scalpel without incident. Continue foot and shoe inspections daily. Monitor blood glucose per PCP/Endocrinologist's recommendations. Continue soft,  supportive shoe gear daily. Report any pedal injuries to medical professional. Call office if there are any questions/concerns.  Return in about 3 months (around 11/09/2023).  Blake Rivera, DPM      [facility] LOCATION: [address] [place] [address]                   Office [phone]   [facility] LOCATION: [address] [place], [place] [address] Office [phone]

## 2023-08-29 ENCOUNTER — Ambulatory Visit (INDEPENDENT_AMBULATORY_CARE_PROVIDER_SITE_OTHER): Admitting: Internal Medicine

## 2023-08-29 ENCOUNTER — Observation Stay
Admission: EM | Admit: 2023-08-29 | Discharge: 2023-08-30 | Disposition: A | Discharge status: Home / Self Care | Attending: Internal Medicine | Admitting: Internal Medicine

## 2023-08-29 ENCOUNTER — Emergency Department

## 2023-08-29 ENCOUNTER — Other Ambulatory Visit: Payer: Self-pay

## 2023-08-29 ENCOUNTER — Emergency Department
Admit: 2023-08-29 | Discharge: 2023-08-29 | Disposition: A | Discharge status: Home / Self Care | Attending: Student | Admitting: Student

## 2023-08-29 ENCOUNTER — Encounter: Payer: Self-pay | Admitting: Internal Medicine

## 2023-08-29 VITALS — BP 130/72 | Temp 98.2°F | Resp 16 | Ht 72.0 in | Wt 215.7 lb

## 2023-08-29 DIAGNOSIS — I4430 Unspecified atrioventricular block: Secondary | ICD-10-CM | POA: Diagnosis not present

## 2023-08-29 DIAGNOSIS — R42 Dizziness and giddiness: Secondary | ICD-10-CM

## 2023-08-29 DIAGNOSIS — Z87891 Personal history of nicotine dependence: Secondary | ICD-10-CM | POA: Insufficient documentation

## 2023-08-29 DIAGNOSIS — R001 Bradycardia, unspecified: Principal | ICD-10-CM | POA: Diagnosis present

## 2023-08-29 DIAGNOSIS — N179 Acute kidney failure, unspecified: Secondary | ICD-10-CM | POA: Diagnosis not present

## 2023-08-29 DIAGNOSIS — N401 Enlarged prostate with lower urinary tract symptoms: Secondary | ICD-10-CM

## 2023-08-29 DIAGNOSIS — N400 Enlarged prostate without lower urinary tract symptoms: Secondary | ICD-10-CM | POA: Diagnosis not present

## 2023-08-29 DIAGNOSIS — I10 Essential (primary) hypertension: Secondary | ICD-10-CM | POA: Diagnosis not present

## 2023-08-29 DIAGNOSIS — F1090 Alcohol use, unspecified, uncomplicated: Secondary | ICD-10-CM | POA: Insufficient documentation

## 2023-08-29 LAB — CBC
HCT: 46.9 % (ref 39.0–52.0)
HCT: 41.7 % (ref 39.0–52.0)
Hemoglobin: 14.8 g/dL (ref 13.0–17.0)
Hemoglobin: 13.4 g/dL (ref 13.0–17.0)
MCH: 28.1 pg (ref 26.0–34.0)
MCH: 28.6 pg (ref 26.0–34.0)
MCHC: 31.6 g/dL (ref 30.0–36.0)
MCHC: 32.1 g/dL (ref 30.0–36.0)
MCV: 89 fL (ref 80.0–100.0)
MCV: 88.9 fL (ref 80.0–100.0)
Platelets: 154 K/uL (ref 150–400)
Platelets: 147 K/uL — ABNORMAL LOW (ref 150–400)
RBC: 5.27 MIL/uL (ref 4.22–5.81)
RBC: 4.69 MIL/uL (ref 4.22–5.81)
RDW: 14.3 % (ref 11.5–15.5)
RDW: 14.3 % (ref 11.5–15.5)
WBC: 4.8 K/uL (ref 4.0–10.5)
WBC: 4 K/uL (ref 4.0–10.5)
nRBC: 0 % (ref 0.0–0.2)
nRBC: 0 % (ref 0.0–0.2)

## 2023-08-29 LAB — BASIC METABOLIC PANEL WITH GFR
Anion gap: 11 (ref 5–15)
BUN: 13 mg/dL (ref 8–23)
CO2: 27 mmol/L (ref 22–32)
Calcium: 9.6 mg/dL (ref 8.9–10.3)
Chloride: 101 mmol/L (ref 98–111)
Creatinine, Ser: 1.29 mg/dL — ABNORMAL HIGH (ref 0.61–1.24)
GFR, Estimated: 54 mL/min — ABNORMAL LOW (ref >60)
Glucose, Bld: 121 mg/dL — ABNORMAL HIGH (ref 70–99)
Potassium: 4.3 mmol/L (ref 3.5–5.1)
Sodium: 139 mmol/L (ref 135–145)

## 2023-08-29 LAB — ECHOCARDIOGRAM COMPLETE
Area-P 1/2: 3.12 cm2
Height: 72 in
S' Lateral: 2.9 cm
Weight: 3449.76 [oz_av]

## 2023-08-29 LAB — TROPONIN I (HIGH SENSITIVITY)
Troponin I (High Sensitivity): 12 ng/L (ref <18)
Troponin I (High Sensitivity): 12 ng/L (ref <18)

## 2023-08-29 LAB — TSH: TSH: 1.749 u[IU]/mL (ref 0.350–4.500)

## 2023-08-29 LAB — CREATININE, SERUM
Creatinine, Ser: 1.15 mg/dL (ref 0.61–1.24)
GFR, Estimated: >60 mL/min (ref >60)

## 2023-08-29 MED ORDER — AMLODIPINE BESYLATE 5 MG PO TABS: 10.0000 mg | ORAL_TABLET | Freq: Every day | ORAL | Status: DC

## 2023-08-29 MED ORDER — ENOXAPARIN SODIUM 40 MG/0.4ML IJ SOSY
40.0000 mg | PREFILLED_SYRINGE | INTRAMUSCULAR | Status: DC
  Administered 2023-08-29 (×2): 40 mg via SUBCUTANEOUS
  Filled 2023-08-29: qty 0.4

## 2023-08-29 MED ORDER — SODIUM CHLORIDE 0.9% FLUSH
3.0000 mL | Freq: Two times a day (BID) | INTRAVENOUS | Status: DC
  Administered 2023-08-29 (×4): 3 mL via INTRAVENOUS

## 2023-08-29 MED ORDER — ATORVASTATIN CALCIUM 20 MG PO TABS: 10.0000 mg | ORAL_TABLET | Freq: Every day | ORAL | Status: DC

## 2023-08-29 MED ORDER — LACTATED RINGERS IV SOLN: INTRAVENOUS | Status: AC

## 2023-08-29 MED ORDER — ONDANSETRON HCL 4 MG/2ML IJ SOLN: 4.0000 mg | Freq: Four times a day (QID) | INTRAMUSCULAR | Status: DC | PRN

## 2023-08-29 MED ORDER — POLYETHYLENE GLYCOL 3350 17 G PO PACK: 17.0000 g | PACK | Freq: Every day | ORAL | Status: DC | PRN

## 2023-08-29 MED ORDER — TAMSULOSIN HCL 0.4 MG PO CAPS: 0.8000 mg | ORAL_CAPSULE | Freq: Every day | ORAL | Status: DC

## 2023-08-29 MED ORDER — ACETAMINOPHEN 325 MG PO TABS: 650.0000 mg | ORAL_TABLET | Freq: Four times a day (QID) | ORAL | Status: DC | PRN

## 2023-08-29 MED ORDER — ONDANSETRON HCL 4 MG PO TABS: 4.0000 mg | ORAL_TABLET | Freq: Four times a day (QID) | ORAL | Status: DC | PRN

## 2023-08-29 MED ORDER — ACETAMINOPHEN 325 MG RE SUPP: 650.0000 mg | Freq: Four times a day (QID) | RECTAL | Status: DC | PRN

## 2023-08-29 NOTE — Progress Notes (Signed)
 Acute Office Visit  Subjective:     Patient ID: Blake Rivera, male    DOB: 08-20-1934, 89 y.o.   MRN: [id number]  Chief Complaint  Patient presents with   Dizziness    HPI Patient is in today for dizziness.   Discussed the use of AI scribe software for clinical note transcription with the patient, who gave verbal consent to proceed.  History of Present Illness Blake Rivera is an 89 year old male who presents with dizziness and lightheadedness.  Dizziness and lightheadedness have been present for six weeks, primarily in the mornings and sometimes in the afternoons. The sensation is described as 'wobbly' and is triggered by standing or moving, especially in the kitchen. There is no associated pain or presyncope.  He experiences occasional dyspnea, particularly when ambulating, which requires rest. Palpitations or a feeling of jitteriness occur at times. Home pulse monitoring shows bradycardia with rates in the forties, which is concerning given a previous heart rate of 81 a month ago.  He takes Flomax , a cholesterol medication, and amlodipine . Flomax  sometimes affects his blood pressure, but his current reading is 130/72. Excessive daytime sleepiness is noted despite attempts to get adequate rest at night. He has quit smoking.     Review of Systems  Constitutional:  Negative for chills and fever.  HENT:  Negative for congestion and sinus pain.   Respiratory:  Positive for shortness of breath. Negative for cough and wheezing.   Cardiovascular:  Positive for palpitations. Negative for chest pain.  Gastrointestinal:  Negative for abdominal pain, constipation and diarrhea.  Genitourinary:  Negative for dysuria, frequency and urgency.  Neurological:  Positive for dizziness.        Objective:    BP 130/72 (Cuff Size: Large)   Temp 98.2 F (36.8 C) (Oral)   Resp 16   Ht 6' (1.829 m)   Wt 215 lb 11.2 oz (97.8 kg)   BMI 29.25 kg/m  BP Readings from Last 3 Encounters:   08/29/23 130/72  07/31/23 128/80  03/15/23 124/70   Wt Readings from Last 3 Encounters:  08/29/23 215 lb 11.2 oz (97.8 kg)  07/31/23 221 lb 14.4 oz (100.7 kg)  03/15/23 212 lb 1.6 oz (96.2 kg)      Physical Exam Constitutional:      Appearance: Normal appearance.  HENT:     Head: Normocephalic and atraumatic.     Right Ear: Tympanic membrane, ear canal and external ear normal.     Left Ear: Tympanic membrane, ear canal and external ear normal.     Nose: Nose normal.     Mouth/Throat:     Mouth: Mucous membranes are moist.     Pharynx: Oropharynx is clear.  Eyes:     Extraocular Movements: Extraocular movements intact.     Conjunctiva/sclera: Conjunctivae normal.     Pupils: Pupils are equal, round, and reactive to light.  Cardiovascular:     Rate and Rhythm: Regular rhythm. Bradycardia present.  Pulmonary:     Effort: Pulmonary effort is normal.     Breath sounds: Normal breath sounds. No wheezing, rhonchi or rales.  Musculoskeletal:     Cervical back: No tenderness.  Lymphadenopathy:     Cervical: No cervical adenopathy.  Skin:    General: Skin is warm and dry.  Neurological:     General: No focal deficit present.     Mental Status: He is alert. Mental status is at baseline.  Psychiatric:  Mood and Affect: Mood normal.        Behavior: Behavior normal.     No results found for any visits on 08/29/23.      Assessment & Plan:   Assessment & Plan AV Block/Dizziness and lightheadedness/Bradycardia Intermittent dizziness and lightheadedness, exacerbated by standing or movement. - Perform physical examination focusing on heart and lungs. Heart rate low but regular rhythm.  - Order EKG to assess cardiac function, showing severe AV block, HR 46. Hx of first degree AV block on EKG from 2015.  - Discussed AV block with patient, because he is having intermittent dizziness and bradycardia, recommend the patient present to the ER for evaluation and discussion with  Cardiology for pacemaker placement. Offered to call EMS for patient, as he is currently asymptomatic and feels comfortable driving to [place] ER, he opted to drive himself. ER was called and triage is expecting him.   - EKG 12-Lead    Return in about 4 weeks (around 09/26/2023).  Sharyle Fischer, DO

## 2023-08-29 NOTE — ED Notes (Signed)
 Pt ambulated to toilet unassisted by staff. Pt denies dizziness, lightheadedness, and SOB. Pt returned to bed and was reconnected to monitoring equipment. Pt tolerated activity well.

## 2023-08-29 NOTE — ED Notes (Addendum)
 RN helped pt out of bed. Pt showed steady gait ambulating to the toilet. Pt tolerated activity well. Pt helped back in bed. Bed lowest position and locked. Call bell within reach.

## 2023-08-29 NOTE — H&P (Signed)
 History and Physical    Patient: Blake Rivera FMW:[id number] DOB: 1934-06-04 DOA: 08/29/2023 DOS: the patient was seen and examined on 08/29/2023 PCP: [name], DO  Patient coming from: Home  Chief Complaint:  Chief Complaint  Patient presents with   Dizziness   HPI: Blake Rivera is a 89 y.o. male with medical history significant of hypertension, BPH and prediabetes presented to ED with complaint of dizziness for the last 1 week, he was feeling lightheadedness which got worse when he tried to stand up or ambulate.  Denies any chest pain shortness of breath.  No upper respiratory symptoms.  No nausea, vomiting or diarrhea.  No recent illness.  No urinary symptoms.  Patient went to see his PCP and found to have heart rate in 40s so he was asked to come to ED.  ED course and data reviewed.  Patient was found to have bradycardia with heart rate ranging from mid 30s to mid 40s, rest of the vitals stable.  Labs with creatinine of 1.29 with baseline seems to be around 1.  Rest of the labs stable.  Troponin negative. EKG with concern second-degree AV block, type II.  Cardiology was consulted and they are planning to place a pacemaker tomorrow.  Review of Systems: As mentioned in the history of present illness. All other systems reviewed and are negative. Past Medical History:  Diagnosis Date   Diverticulitis of colon    ED (erectile dysfunction)    Hyperglycemia    Hypertension    Vitamin D  deficiency    Past Surgical History:  Procedure Laterality Date   ERCP N/A 01/22/2023   Procedure: ENDOSCOPIC RETROGRADE CHOLANGIOPANCREATOGRAPHY (ERCP);  Surgeon: Jinny Carmine, MD;  Location: [facility] ENDOSCOPY;  Service: Endoscopy;  Laterality: N/A;   HERNIA REPAIR     HOT HEMOSTASIS  01/22/2023   Procedure: HOT HEMOSTASIS (ARGON PLASMA COAGULATION/BICAP);  Surgeon: Jinny Carmine, MD;  Location: [facility] ENDOSCOPY;  Service: Endoscopy;;   REMOVAL OF STONES  01/22/2023   Procedure: REMOVAL OF  STONES;  Surgeon: Jinny Carmine, MD;  Location: [facility] ENDOSCOPY;  Service: Endoscopy;;   ROTATOR CUFF REPAIR  03/17/2018   SPHINCTEROTOMY  01/22/2023   Procedure: ANNETT;  Surgeon: Jinny Carmine, MD;  Location: [facility] ENDOSCOPY;  Service: Endoscopy;;   Social History:  reports that he quit smoking about 9 years ago. His smoking use included cigarettes. He started smoking about 34 years ago. He has a 6.3 pack-year smoking history. He has never used smokeless tobacco. He reports current alcohol use of about 1.0 standard drink of alcohol per week. He reports that he does not use drugs.  Allergies  Allergen Reactions   Lisinopril     Angioedema   Omacor  [Omega-3-Acid Ethyl Esters (Fish)]     Other reaction(s): Rash   Zocor  [Simvastatin]     Muscle Pain    Family History  Problem Relation Age of Onset   Heart disease Mother    Aneurysm Mother    Heart disease Brother    Emphysema Father    Healthy Sister    Healthy Brother    Healthy Brother    Heart disease Sister     Prior to Admission medications   Medication Sig Start Date End Date Taking? Authorizing Provider  amLODipine  (NORVASC ) 10 MG tablet Take 1 tablet (10 mg total) by mouth daily. 07/31/23  Yes [name], DO  atorvastatin  (LIPITOR) 10 MG tablet Take 1 tablet (10 mg total) by mouth daily. 01/29/23  Yes [name],  DO  ibuprofen  (ADVIL ) 400 MG tablet Take 1 tablet (400 mg total) by mouth every 8 (eight) hours as needed for mild pain (pain score 1-3) or moderate pain (pain score 4-6). 01/24/23  Yes Sakai, Isami, DO  tamsulosin  (FLOMAX ) 0.4 MG CAPS capsule Take 2 capsules (0.8 mg total) by mouth daily. 07/31/23  Yes [name], DO    Physical Exam: Vitals:   08/29/23 1230 08/29/23 1300 08/29/23 1330 08/29/23 1445  BP: 136/64 (!) 146/58 (!) 160/103   Pulse: (!) 34 (!) 35 (!) 39   Resp: 14 18 18    Temp:    98.1 F (36.7 C)  TempSrc:    Oral  SpO2: 100% 100% 100%   Weight:        General: Vital  signs reviewed.  Patient is well-developed and well-nourished, in no acute distress and cooperative with exam.  Head: Normocephalic and atraumatic. Eyes: EOMI, conjunctivae normal, no scleral icterus.  Neck: Supple, trachea midline, normal ROM, no JVD,  Cardiovascular: Sinus bradycardia Pulmonary/Chest: Clear to auscultation bilaterally, no wheezes, rales, or rhonchi. Abdominal: Soft, non-tender, non-distended, BS +, Extremities: No lower extremity edema bilaterally,  pulses symmetric and intact bilaterally. No cyanosis or clubbing. Neurological: A&O x3, Strength is normal and symmetric bilaterally, cranial nerve II-XII are grossly intact, no focal motor deficit, sensory intact to light touch bilaterally.  Skin: Warm, dry and intact. No rashes or erythema. Psychiatric: Normal mood and affect.   Data Reviewed: Prior data reviewed as mentioned above  Assessment and Plan: * Symptomatic bradycardia Heart rate in the mid 30s to mid 40s, become dizziness with ambulation and standing up. Cardiology was consulted Admit to cardiac telemetry Echocardiogram ordered Cardiology to place pacemaker tomorrow morning Avoid nodal blocking agents  Hypertension Blood pressure mildly elevated. - Resume home amlodipine  - As needed hydralazine  AKI (acute kidney injury) (HCC) Patient has mild AKI with creatinine at 1.29, baseline seems to be around 1. - Giving some IV fluid -Monitor renal function  BPH (benign prostatic hyperplasia) - Resume home Flomax    Advance Care Planning:   Code Status: Full Code discussed with patient and his wife.  Consults: Cardiology  Family Communication: Discussed with wife at bedside  Severity of Illness: The appropriate patient status for this patient is OBSERVATION. Observation status is judged to be reasonable and necessary in order to provide the required intensity of service to ensure the patient's safety. The patient's presenting symptoms, physical exam  findings, and initial radiographic and laboratory data in the context of their medical condition is felt to place them at decreased risk for further clinical deterioration. Furthermore, it is anticipated that the patient will be medically stable for discharge from the hospital within 2 midnights of admission.   This record has been created using Conservation officer, historic buildings. Errors have been sought and corrected,but may not always be located. Such creation errors do not reflect on the standard of care.   Author: Amaryllis Dare, MD 08/29/2023 3:04 PM  For on call review [email or web address].

## 2023-08-29 NOTE — Consult Note (Signed)
 [facility] C[facility] CONSULT NOTE       Patient ID: Blake Rivera MRN: [id number] DOB/AGE: 1934-02-09 89 y.o.  Admit date: 08/29/2023 Referring Physician Dr. Jacolyn Primary Physician Blake Fend, DO Primary Cardiologist none Reason for Consultation second-degree AVB, type II  HPI: Blake Rivera is a 89 y.o. male  with a past medical history of hypertension, hyperlipidemia who presented to the ED on 08/29/2023 for worsening intermittent lightheadedness for the past month.  EKG in ED revealed second-degree AVB with heart rates 30-40s.  Patient without significant cardiac history.  Patient denies history of CAD, MI, HF or AF. Cardiology was consulted for further evaluation.   Patient presented to the ED with worsening intermittent lightheadedness for the past month.  Patient states his lightheadedness has been occurring more frequently.  Patient denies any chest pain, SOB or palpitations.  Patient denies any syncopal events. Work up in the ED notable for sodium 139, potassium 4.3, creatinine 1.29, GFR 54, hemoglobin 14.8, platelets 154.  CXR without acute ischemic changes.  Troponins negative x 1.  EKG with second-degree AVB, type II, rate 45 bpm.   At the time of my evaluation this AM, patient was comfortably in ED stretcher with wife at bedside.  Patient states he has been having intermittent lightheadedness for the past 1 month.  Patient states this has been happening more frequently but the severity has been the same.  Patient denies any associated SOB, chest pain, palpitations or syncopal events.  Patient states he is unsure exactly when this all started but he believes it was about 1 month ago.  Patient states he is very active, golfs 3 times per week no issues.  Patient denies history of CAD, MI, HF or AF.  Patient states currently he feels great and is without lightheadedness.   Review of systems complete and found to be negative unless listed above    Past Medical  History:  Diagnosis Date   Diverticulitis of colon    ED (erectile dysfunction)    Hyperglycemia    Hypertension    Vitamin D  deficiency     Past Surgical History:  Procedure Laterality Date   ERCP N/A 01/22/2023   Procedure: ENDOSCOPIC RETROGRADE CHOLANGIOPANCREATOGRAPHY (ERCP);  Surgeon: Blake Carmine, MD;  Location: [facility] ENDOSCOPY;  Service: Endoscopy;  Laterality: N/A;   HERNIA REPAIR     HOT HEMOSTASIS  01/22/2023   Procedure: HOT HEMOSTASIS (ARGON PLASMA COAGULATION/BICAP);  Surgeon: Blake Carmine, MD;  Location: [facility] ENDOSCOPY;  Service: Endoscopy;;   REMOVAL OF STONES  01/22/2023   Procedure: REMOVAL OF STONES;  Surgeon: Blake Carmine, MD;  Location: [facility] ENDOSCOPY;  Service: Endoscopy;;   ROTATOR CUFF REPAIR  03/17/2018   SPHINCTEROTOMY  01/22/2023   Procedure: ANNETT;  Surgeon: Blake Carmine, MD;  Location: [facility] ENDOSCOPY;  Service: Endoscopy;;    (Not in a hospital admission)  Social History   Socioeconomic History   Marital status: Married    Spouse name: Not on file   Number of children: 2   Years of education: Not on file   Highest education level: Doctorate  Occupational History   Occupation: Retired  Tobacco Use   Smoking status: Former    Current packs/day: 0.00    Average packs/day: 0.3 packs/day for 25.0 years (6.3 ttl pk-yrs)    Types: Cigarettes    Start date: 05/17/1989    Quit date: 05/18/2014    Years since quitting: 9.2   Smokeless tobacco: Never   Tobacco comments:    Former  smoker - no need to provide with smoking cessation materials  Vaping Use   Vaping status: Never Used  Substance and Sexual Activity   Alcohol use: Yes    Alcohol/week: 1.0 standard drink of alcohol    Types: 1 Shots of liquor per week    Comment: moderate - 1 whiskey shot per day   Drug use: No   Sexual activity: Yes  Other Topics Concern   Not on file  Social History Narrative   Not on file   Social Drivers of Health   Financial Resource Strain: Low Risk  (03/15/2023)    Overall Financial Resource Strain (CARDIA)    Difficulty of Paying Living Expenses: Not hard at all  Food Insecurity: No Food Insecurity (03/15/2023)   Hunger Vital Sign    Worried About Running Out of Food in the Last Year: Never true    Ran Out of Food in the Last Year: Never true  Transportation Needs: No Transportation Needs (03/15/2023)   PRAPARE - Administrator, Civil Service (Medical): No    Lack of Transportation (Non-Medical): No  Physical Activity: Sufficiently Active (03/15/2023)   Exercise Vital Sign    Days of Exercise per Week: 3 days    Minutes of Exercise per Session: 60 min  Stress: No Stress Concern Present (03/15/2023)   [employer] of Occupational Health - Occupational Stress Questionnaire    Feeling of Stress : Not at all  Social Connections: Socially Integrated (03/15/2023)   Social Connection and Isolation Panel    Frequency of Communication with Friends and Family: More than three times a week    Frequency of Social Gatherings with Friends and Family: Three times a week    Attends Religious Services: More than 4 times per year    Active Member of Clubs or Organizations: Yes    Attends Banker Meetings: More than 4 times per year    Marital Status: Married  Catering manager Violence: Not At Risk (03/15/2023)   Humiliation, Afraid, Rape, and Kick questionnaire    Fear of Current or Ex-Partner: No    Emotionally Abused: No    Physically Abused: No    Sexually Abused: No    Family History  Problem Relation Age of Onset   Heart disease Mother    Aneurysm Mother    Heart disease Brother    Emphysema Father    Healthy Sister    Healthy Brother    Healthy Brother    Heart disease Sister      Vitals:   08/29/23 1027 08/29/23 1035 08/29/23 1040 08/29/23 1045  BP: (!) 146/93   (!) 148/60  Pulse: 60 61 (!) 43 (!) 41  Resp:  13 15 16   Temp: 98.6 F (37 C)     TempSrc: Oral     SpO2:   100% 100%  Weight:        PHYSICAL  EXAM General: Well-appearing elderly male, well nourished, in no acute distress. HEENT: Normocephalic and atraumatic. Neck: No JVD.   Lungs: Normal respiratory effort on room air. Clear bilaterally to auscultation. No wheezes, crackles, rhonchi.  Heart: HRR, slow rate. Normal S1 and S2 without gallops or murmurs.  Abdomen: Non-distended appearing.  Msk: Normal strength and tone for age. Extremities: Warm and well perfused. No clubbing, cyanosis, edema.  Neuro: Alert and oriented X 3. Psych: Answers questions appropriately.   Labs: Basic Metabolic Panel: Recent Labs    08/29/23 1032  NA 139  K 4.3  CL 101  CO2 27  GLUCOSE 121*  BUN 13  CREATININE 1.29*  CALCIUM  9.6   Liver Function Tests: No results for input(s): AST, ALT, ALKPHOS, BILITOT, PROT, ALBUMIN in the last 72 hours. No results for input(s): LIPASE, AMYLASE in the last 72 hours. CBC: Recent Labs    08/29/23 1032  WBC 4.8  HGB 14.8  HCT 46.9  MCV 89.0  PLT 154   Cardiac Enzymes: Recent Labs    08/29/23 1032  TROPONINIHS 12   BNP: No results for input(s): BNP in the last 72 hours. D-Dimer: No results for input(s): DDIMER in the last 72 hours. Hemoglobin A1C: No results for input(s): HGBA1C in the last 72 hours. Fasting Lipid Panel: No results for input(s): CHOL, HDL, LDLCALC, TRIG, CHOLHDL, LDLDIRECT in the last 72 hours. Thyroid  Function Tests: No results for input(s): TSH, T4TOTAL, T3FREE, THYROIDAB in the last 72 hours.  Invalid input(s): FREET3 Anemia Panel: No results for input(s): VITAMINB12, FOLATE, FERRITIN, TIBC, IRON, RETICCTPCT in the last 72 hours.   Radiology: [facility] Fall Chest Port 1 View Result Date: 08/29/2023 CLINICAL DATA:  Dizziness EXAM: PORTABLE CHEST 1 VIEW COMPARISON:  01/19/2023 FINDINGS: The lungs are clear without focal pneumonia, edema, pneumothorax or pleural effusion. The cardiopericardial silhouette is within normal limits  for size. No acute bony abnormality. Telemetry leads overlie the chest. IMPRESSION: No active disease. Electronically Signed   By: Camellia Candle M.D.   On: 08/29/2023 11:31    ECHO ordered.  TELEMETRY reviewed by me 08/29/2023: second degree AVB, type 2, rate 30-40s  EKG reviewed by me: second degree AVB, type 2 rate   Data reviewed by me 08/29/2023: last 24h vitals tele labs imaging I/O ED provider note, admission H&P.  Active Problems:   * No active hospital problems. *    ASSESSMENT AND PLAN:  UNKNOWN SCHLEYER is a 89 y.o. male  with a past medical history of hypertension, hyperlipidemia who presented to the ED on 08/29/2023 for worsening intermittent lightheadedness for the past month.  EKG in ED revealed second-degree AVB with heart rates 30-40s.  Patient without significant cardiac history.  Patient denies history of CAD, MI, HF or AF. Cardiology was consulted for further evaluation.   # Second degree AVB, type 2  # Lightheadedness -Echo ordered. TSH ordered. -Avoid all AV nodal blockers. -Plan for [employer] pacemaker implantation on 08/14 at 7:30 AM with Dr. Ammon. NPO at midnight.  -Discussed the risks and benefits of proceeding with PPM for symptomatic second degree AVB. He is agreeable to proceed.  NPO at midnight until procedure (08/14 at 7:30 AM) with Dr. Ammon.  Written consent will be obtained.   # Hypertension # Hyperlipidemia -Continue home amlodipine  10 mg daily. -Continue home atorvastatin  10 mg daily.   This patient's plan of care was discussed and created with Dr. Ammon and he is in agreement.  Signed: Eyad Rochford, PA-C  08/29/2023, 11:45 AM [facility]

## 2023-08-29 NOTE — Group Note (Deleted)
 Date:  08/29/2023 Time:  2:21 PM  Group Topic/Focus:  Wellness Toolbox:   The focus of this group is to discuss various aspects of wellness, balancing those aspects and exploring ways to increase the ability to experience wellness.  Patients will create a wellness toolbox for use upon discharge.     Participation Level:  {BHH PARTICIPATION OZCZO:77735}  Participation Quality:  {BHH PARTICIPATION QUALITY:22265}  Affect:  {BHH AFFECT:22266}  Cognitive:  {BHH COGNITIVE:22267}  Insight: {BHH Insight2:20797}  Engagement in Group:  {BHH ENGAGEMENT IN HMNLE:77731}  Modes of Intervention:  {BHH MODES OF INTERVENTION:22269}  Additional Comments:  ***  Myra Curtistine BROCKS 08/29/2023, 2:21 PM

## 2023-08-29 NOTE — ED Triage Notes (Signed)
 C/O feeling lightheaded for a couple of days. Went to PCP today and HR in the 46's-- referred to ED for eval.  AAOx3. Skin warm and dry. NAD

## 2023-08-29 NOTE — Assessment & Plan Note (Signed)
 Resume home Flomax

## 2023-08-29 NOTE — Assessment & Plan Note (Addendum)
 Blood pressure mildly elevated. - Resume home amlodipine  - As needed hydralazine

## 2023-08-29 NOTE — Assessment & Plan Note (Signed)
 Heart rate in the mid 30s to mid 40s, become dizziness with ambulation and standing up. Cardiology was consulted Admit to cardiac telemetry Echocardiogram ordered Cardiology to place pacemaker tomorrow morning Avoid nodal blocking agents

## 2023-08-29 NOTE — Assessment & Plan Note (Signed)
 Patient has mild AKI with creatinine at 1.29, baseline seems to be around 1. - Giving some IV fluid -Monitor renal function

## 2023-08-29 NOTE — ED Notes (Signed)
 Called CCMD to add pt to monitoring.

## 2023-08-29 NOTE — Progress Notes (Signed)
  Echocardiogram 2D Echocardiogram has been performed.  [facility] 08/29/2023, 2:29 PM

## 2023-08-29 NOTE — ED Provider Notes (Signed)
 [facility] Provider Note    Event Date/Time   First MD Initiated Contact with Patient 08/29/23 1038     (approximate)   History   Dizziness   HPI  Blake [facility] is a 89 y.o. male with a history of hypertension and diverticulitis who presents with dizziness over the last week, intermittent, described as feeling lightheaded, and worse when he first stands up.  He has no associated chest pain or palpitations.  He denies having the symptoms previously.  He has no leg swelling.  He has no fever or chills.  He was seen by his doctor this morning and referred to the ED due to an abnormal EKG.  I reviewed the past medical records.  The patient's most recent outpatient encounter prior to today was on 7/24 with podiatry for evaluation of the diabetic foot.  He was seen by internal medicine earlier today with the current symptoms of dizziness and was sent to the ED.   Physical Exam   Triage Vital Signs: ED Triage Vitals  Encounter Vitals Group     BP 08/29/23 1027 (!) 146/93     Girls Systolic BP Percentile --      Girls Diastolic BP Percentile --      Boys Systolic BP Percentile --      Boys Diastolic BP Percentile --      Pulse Rate 08/29/23 1027 60     Resp --      Temp 08/29/23 1027 98.6 F (37 C)     Temp Source 08/29/23 1027 Oral     SpO2 --      Weight 08/29/23 1024 215 lb 9.8 oz (97.8 kg)     Height --      Head Circumference --      Peak Flow --      Pain Score 08/29/23 1024 0     Pain Loc --      Pain Education --      Exclude from Growth Chart --     Most recent vital signs: Vitals:   08/29/23 1040 08/29/23 1045  BP:  (!) 148/60  Pulse: (!) 43 (!) 41  Resp: 15 16  Temp:    SpO2: 100% 100%     General: Awake, no distress.  CV:  Good peripheral perfusion.  Resp:  Normal effort.  Lungs CTAB. Abd:  No distention.  Other:  No peripheral edema.   ED Results / Procedures / Treatments   Labs (all labs ordered are listed, but  only abnormal results are displayed) Labs Reviewed  BASIC METABOLIC PANEL WITH GFR - Abnormal; Notable for the following components:      Result Value   Glucose, Bld 121 (*)    Creatinine, Ser 1.29 (*)    GFR, Estimated 54 (*)    All other components within normal limits  CBC  TSH  TROPONIN I (HIGH SENSITIVITY)  TROPONIN I (HIGH SENSITIVITY)     EKG  ED ECG REPORT I, Waylon Cassis, the attending physician, personally viewed and interpreted this ECG.  Date: 08/29/2023 EKG Time: 1026 Rate: 60 Rhythm: Third-degree AV block versus atrial bigeminy QRS Axis: normal Intervals: normal ST/T Wave abnormalities: normal Narrative Interpretation: no evidence of acute ischemia    RADIOLOGY  Chest x-ray: I independently viewed and interpreted images; there is no focal consolidation or edema  PROCEDURES:  Critical Care performed: No  Procedures   MEDICATIONS ORDERED IN ED: Medications - No data to display  IMPRESSION / MDM / ASSESSMENT AND PLAN / ED COURSE  I reviewed the triage vital signs and the nursing notes.  89 year old male with PMH as noted above presents with lightheadedness over the last week.  He was referred to the ED from his primary care provider due to an abnormal EKG.  Physical exam is unremarkable.  His heart rate is around 40 although blood pressure is mildly elevated.  EKG shows rhythm with independent P waves concerning for third-degree heart block.  Differential diagnosis includes, but is not limited to, third-degree heart block, other symptomatic bradycardia, dehydration, electrolyte abnormality, other metabolic etiology.  We will obtain basic labs, cardiac enzymes, chest x-ray, consult cardiology, and reassess.  Patient's presentation is most consistent with acute presentation with potential threat to life or bodily function.  The patient is on the cardiac monitor to evaluate for evidence of arrhythmia and/or significant heart rate  changes.  ----------------------------------------- 12:01 PM on 08/29/2023 -----------------------------------------  BMP and CBC show no acute findings.  Initial troponin is negative.  Chest x-ray is clear.  I consulted and discussed the case with Dr. Ammon from cardiology.  Based on cardiology evaluation the thought is that the patient is likely having atrial bigeminy but given his symptoms will need a pacemaker.  I consulted Dr. Caleen from the hospitalist service; based on our discussion she agrees to evaluate patient for admission.   FINAL CLINICAL IMPRESSION(S) / ED DIAGNOSES   Final diagnoses:  Lightheadedness  Symptomatic bradycardia     Rx / DC Orders   ED Discharge Orders     None        Note:  This document was prepared using Dragon voice recognition software and may include unintentional dictation errors.    Jacolyn Pae, MD 08/29/23 1201

## 2023-08-30 ENCOUNTER — Encounter
Admission: EM | Disposition: A | Discharge status: Home / Self Care | Payer: Self-pay | Source: Home / Self Care | Attending: Emergency Medicine

## 2023-08-30 ENCOUNTER — Encounter: Payer: Self-pay | Admitting: Cardiology

## 2023-08-30 DIAGNOSIS — R42 Dizziness and giddiness: Secondary | ICD-10-CM | POA: Diagnosis not present

## 2023-08-30 DIAGNOSIS — Z950 Presence of cardiac pacemaker: Secondary | ICD-10-CM | POA: Diagnosis not present

## 2023-08-30 DIAGNOSIS — N179 Acute kidney failure, unspecified: Secondary | ICD-10-CM | POA: Diagnosis not present

## 2023-08-30 DIAGNOSIS — I10 Essential (primary) hypertension: Secondary | ICD-10-CM | POA: Diagnosis not present

## 2023-08-30 DIAGNOSIS — R001 Bradycardia, unspecified: Secondary | ICD-10-CM | POA: Diagnosis not present

## 2023-08-30 HISTORY — PX: PACEMAKER LEADLESS INSERTION: EP1219

## 2023-08-30 SURGERY — PACEMAKER LEADLESS INSERTION
Anesthesia: Moderate Sedation

## 2023-08-30 MED ORDER — SODIUM CHLORIDE 0.9 % IV SOLN
80.0000 mg | INTRAVENOUS | Status: DC
  Filled 2023-08-30: qty 2

## 2023-08-30 MED ORDER — CEFAZOLIN SODIUM-DEXTROSE 2-4 GM/100ML-% IV SOLN: 2.0000 g | INTRAVENOUS | Status: DC

## 2023-08-30 MED ORDER — SODIUM CHLORIDE 0.9 % IV SOLN: INTRAVENOUS | Status: DC

## 2023-08-30 MED ORDER — IOHEXOL 300 MG/ML  SOLN
INTRAMUSCULAR | Status: DC | PRN
Start: 2023-08-30 — End: 2023-08-30
  Administered 2023-08-30: 30 mL

## 2023-08-30 MED ORDER — FENTANYL CITRATE (PF) 100 MCG/2ML IJ SOLN
INTRAMUSCULAR | Status: AC
Start: 2023-08-30 — End: 2023-08-30
  Filled 2023-08-30: qty 2

## 2023-08-30 MED ORDER — HEPARIN (PORCINE) IN NACL 1000-0.9 UT/500ML-% IV SOLN
INTRAVENOUS | Status: AC
Start: 2023-08-30 — End: 2023-08-30
  Filled 2023-08-30: qty 500

## 2023-08-30 MED ORDER — LIDOCAINE HCL 1 % IJ SOLN
INTRAMUSCULAR | Status: AC
  Filled 2023-08-30: qty 40

## 2023-08-30 MED ORDER — SODIUM CHLORIDE 0.9 % IV SOLN: 250.0000 mL | INTRAVENOUS | Status: DC | PRN

## 2023-08-30 MED ORDER — ACETAMINOPHEN 325 MG PO TABS: 650.0000 mg | ORAL_TABLET | ORAL | Status: DC | PRN

## 2023-08-30 MED ORDER — HEPARIN SODIUM (PORCINE) 1000 UNIT/ML IJ SOLN
INTRAMUSCULAR | Status: DC | PRN
  Administered 2023-08-30: 5000 [IU] via INTRAVENOUS

## 2023-08-30 MED ORDER — SODIUM CHLORIDE 0.9% FLUSH: 3.0000 mL | Freq: Two times a day (BID) | INTRAVENOUS | Status: DC

## 2023-08-30 MED ORDER — LIDOCAINE HCL (PF) 1 % IJ SOLN
INTRAMUSCULAR | Status: DC | PRN
  Administered 2023-08-30: 20 mL

## 2023-08-30 MED ORDER — SODIUM CHLORIDE 0.9% FLUSH
3.0000 mL | INTRAVENOUS | Status: DC | PRN
Start: 2023-08-30 — End: 2023-08-30

## 2023-08-30 MED ORDER — HEPARIN (PORCINE) IN NACL 1000-0.9 UT/500ML-% IV SOLN
INTRAVENOUS | Status: DC | PRN
  Administered 2023-08-30: 1000 mL

## 2023-08-30 MED ORDER — HEPARIN SODIUM (PORCINE) 1000 UNIT/ML IJ SOLN
INTRAMUSCULAR | Status: AC
  Filled 2023-08-30: qty 20

## 2023-08-30 MED ORDER — FENTANYL CITRATE (PF) 100 MCG/2ML IJ SOLN
INTRAMUSCULAR | Status: DC | PRN
  Administered 2023-08-30: 25 ug via INTRAVENOUS

## 2023-08-30 MED ORDER — MIDAZOLAM HCL 2 MG/2ML IJ SOLN
INTRAMUSCULAR | Status: AC
  Filled 2023-08-30: qty 2

## 2023-08-30 MED ORDER — MIDAZOLAM HCL 2 MG/2ML IJ SOLN
INTRAMUSCULAR | Status: DC | PRN
  Administered 2023-08-30: 1 mg via INTRAVENOUS

## 2023-08-30 SURGICAL SUPPLY — 17 items
CATH INFINITI JR4 5F (CATHETERS) IMPLANT
DILATOR VESSEL 38 20CM 12FR (INTRODUCER) IMPLANT
DILATOR VESSEL 38 20CM 14FR (INTRODUCER) IMPLANT
DILATOR VESSEL 38 20CM 18FR (INTRODUCER) IMPLANT
DILATOR VESSEL 38 20CM 8FR (INTRODUCER) IMPLANT
KIT SYRINGE INJ CVI SPIKEX1 (MISCELLANEOUS) IMPLANT
NDL PERC 18GX7CM (NEEDLE) IMPLANT
NEEDLE PERC 18GX7CM (NEEDLE) ×1 IMPLANT
PACEMAKER LEADLESS AV2 MICRA (Pacemaker) IMPLANT
PACK CARDIAC CATH (CUSTOM PROCEDURE TRAY) ×1 IMPLANT
PAD ELECT DEFIB RADIOL ZOLL (MISCELLANEOUS) IMPLANT
SHEATH AVANTI 7FRX11 (SHEATH) IMPLANT
SHEATH INTRODUCER MICRA (SHEATH) IMPLANT
STATION PROTECTION PRESSURIZED (MISCELLANEOUS) IMPLANT
SUT SILK 0 FSL (SUTURE) IMPLANT
WIRE AMPLATZ SS-J .035X180CM (WIRE) IMPLANT
WIRE HITORQ VERSACORE ST 145CM (WIRE) IMPLANT

## 2023-08-30 NOTE — ED Notes (Addendum)
 Report given to Dorothyann Cedar, RN  regarding pt to go to cath lab this morning. Discussed starting a second IV and maintenance NS. 20 G IV started in RAC x 1 attempt with good blood return. NS infusing at ordered rate of 50 mL/hr at this time.

## 2023-08-30 NOTE — ED Notes (Addendum)
 Report received from Jeanna, [place]. Warm blankets and extra pillow provided per pt request. Urinal emptied. Total urinary output 950 emptied. No other requests at this time. Bed in lowest position with wheels locked. Side rails up. Call light within reach.

## 2023-08-30 NOTE — Discharge Summary (Signed)
 Physician Discharge Summary   Patient: Blake Rivera MRN: [id number] DOB: Apr 24, 1934  Admit date:     08/29/2023  Discharge date: 08/30/23  Discharge Physician: Blake Rivera   PCP: Blake Fend, DO   Recommendations at discharge:  Please obtain CBC and BMP and follow-up Follow-up with primary care provider Follow-up with cardiology  Discharge Diagnoses: Principal Problem:   Symptomatic bradycardia Active Problems:   Hypertension   AKI (acute kidney injury) (HCC)   BPH (benign prostatic hyperplasia)   Lightheadedness   Hospital Course: Blake Rivera is a 89 y.o. male with medical history significant of hypertension, BPH and prediabetes presented to ED with complaint of dizziness for the last 1 week, he was feeling lightheadedness which got worse when he tried to stand up or ambulate.  Denies any chest pain shortness of breath.  No upper respiratory symptoms.  No nausea, vomiting or diarrhea.  No recent illness.  No urinary symptoms.   Patient went to see his PCP and found to have heart rate in 40s so he was asked to come to ED.  On presentation patient was ound to have bradycardia with heart rate ranging from mid 30s to mid 40s, rest of the vitals stable.  Labs with creatinine of 1.29 with baseline seems to be around 1.  Rest of the labs stable.  Troponin negative. EKG with concern second-degree AV block, type II.   Cardiology was consulted and they are planning to place a pacemaker tomorrow.  8/14: Vitals Blood pressure in the morning, s/p pacemaker placement.  Patient was able to ambulate without any symptoms of dizziness.  Cardiology cleared him for discharge and they will have a close follow-up as outpatient.  Echocardiogram was normal, without any significant abnormality.  Patient will continue on current medications and need to have a close follow-up with his providers for further assistance.  Assessment and Plan: * Symptomatic bradycardia Heart rate in the  mid 30s to mid 40s, become dizziness with ambulation and standing up. Cardiology was consulted, echocardiogram normal, s/p pacemaker placement with resolution of symptoms Avoid nodal blocking agents  Hypertension Blood pressure mildly elevated. - Resume home amlodipine  - As needed hydralazine  AKI (acute kidney injury) (HCC) Patient has mild AKI with creatinine at 1.29, baseline seems to be around 1.  Improved with IV fluid -Monitor renal function  BPH (benign prostatic hyperplasia) - Resume home Flomax   Consultants: Cardiology Procedures performed: Pacemaker placement Disposition: Home Diet recommendation:  Discharge Diet Orders (From admission, onward)     Start     Ordered   08/30/23 0000  Diet - low sodium heart healthy        08/30/23 1326           Cardiac diet DISCHARGE MEDICATION: Allergies as of 08/30/2023       Reactions   Lisinopril    Angioedema   Omacor  [omega-3-acid Ethyl Esters (fish)]    Other reaction(s): Rash   Zocor  [simvastatin]    Muscle Pain        Medication List     TAKE these medications    amLODipine  10 MG tablet Commonly known as: NORVASC  Take 1 tablet (10 mg total) by mouth daily.   atorvastatin  10 MG tablet Commonly known as: LIPITOR Take 1 tablet (10 mg total) by mouth daily.   ibuprofen  400 MG tablet Commonly known as: ADVIL  Take 1 tablet (400 mg total) by mouth every 8 (eight) hours as needed for mild pain (pain score 1-3) or  moderate pain (pain score 4-6).   tamsulosin  0.4 MG Caps capsule Commonly known as: FLOMAX  Take 2 capsules (0.8 mg total) by mouth daily.        Follow-up Information     [place], Caralyn, PA-C. Go in 1 day(s).   Specialty: Cardiology Why: Nurse visit for groin suture removal - scheduled for 08/31/2023 at 10 AM Contact information: [address] [place] [address] [phone]         Blake Keller BROCKS, MD. Go in 1 week(s).   Specialty: Cardiology Contact  information: [address] [place] [address] [phone]         Blake Fend, DO. Schedule an appointment as soon as possible for a visit in 1 week(s).   Specialty: Internal Medicine Contact information: [address] [place] [address] [phone]                Discharge Exam: Blake Rivera   08/29/23 1024  Weight: 97.8 kg   General.  Well-developed elderly man, in no acute distress. Pulmonary.  Lungs clear bilaterally, normal respiratory effort. CV.  Regular rate and rhythm, no JVD, rub or murmur. Abdomen.  Soft, nontender, nondistended, BS positive. CNS.  Alert and oriented .  No focal neurologic deficit. Extremities.  No edema, no cyanosis, pulses intact and symmetrical. Psychiatry.  Judgment and insight appears normal.   Condition at discharge: stable  The results of significant diagnostics from this hospitalization (including imaging, microbiology, ancillary and laboratory) are listed below for reference.   Imaging Studies: EP PPM/ICD IMPLANT Result Date: 08/30/2023 Successful Medtronic Micra AV 2 leadless pacemaker implantation   ECHOCARDIOGRAM COMPLETE Result Date: 08/29/2023    ECHOCARDIOGRAM REPORT   Patient Name:   Blake Rivera Date of Exam: 08/29/2023 Medical Rec #:  [id number]        Height:       72.0 in Accession #:    [id number]       Weight:       215.6 lb Date of Birth:  01/23/1934        BSA:          2.200 m Patient Age:    89 years         BP:           136/64 mmHg Patient Gender: M                HR:           31 bpm. Exam Location:  Inpatient Procedure: 2D Echo (Both Spectral and Color Flow Doppler were utilized during            procedure). Indications:     complete heart block  History:         Patient has prior history of Echocardiogram examinations, most                  recent 01/22/2014. Risk Factors:Hypertension.  Sonographer:     Blake Rivera RDCS Referring Phys:  [id number] Blake Rivera  Diagnosing Phys: Blake Dooms MD IMPRESSIONS  1. Left ventricular ejection fraction, by estimation, is 65 to 70%. The left ventricle has normal function. The left ventricle has no regional wall motion abnormalities. Indeterminate diastolic filling due to E-A fusion.  2. Right ventricular systolic function is normal. The right ventricular size is normal. There is normal pulmonary artery systolic pressure.  3. The mitral valve is normal in structure. Trivial mitral valve regurgitation. No evidence of mitral stenosis.  4. The aortic valve is normal in structure. Aortic valve regurgitation is not visualized. No aortic stenosis is present.  5. The inferior vena cava is normal in size with greater than 50% respiratory variability, suggesting right atrial pressure of 3 mmHg. FINDINGS  Left Ventricle: Left ventricular ejection fraction, by estimation, is 65 to 70%. The left ventricle has normal function. The left ventricle has no regional wall motion abnormalities. Strain was performed and the global longitudinal strain is indeterminate. The left ventricular internal cavity size was normal in size. There is no left ventricular hypertrophy. Indeterminate diastolic filling due to E-A fusion. Right Ventricle: The right ventricular size is normal. No increase in right ventricular wall thickness. Right ventricular systolic function is normal. There is normal pulmonary artery systolic pressure. The tricuspid regurgitant velocity is 2.78 m/s, and  with an assumed right atrial pressure of 3 mmHg, the estimated right ventricular systolic pressure is 33.9 mmHg. Left Atrium: Left atrial size was normal in size. Right Atrium: Right atrial size was normal in size. Pericardium: There is no evidence of pericardial effusion. Mitral Valve: The mitral valve is normal in structure. Trivial mitral valve regurgitation. No evidence of mitral valve stenosis. Tricuspid Valve: The tricuspid valve is normal in structure. Tricuspid valve  regurgitation is trivial. No evidence of tricuspid stenosis. Aortic Valve: The aortic valve is normal in structure. Aortic valve regurgitation is not visualized. No aortic stenosis is present. Pulmonic Valve: The pulmonic valve was normal in structure. Pulmonic valve regurgitation is not visualized. No evidence of pulmonic stenosis. Aorta: The aortic root is normal in size and structure. Venous: The inferior vena cava is normal in size with greater than 50% respiratory variability, suggesting right atrial pressure of 3 mmHg. IAS/Shunts: No atrial level shunt detected by color flow Doppler. Additional Comments: 3D was performed not requiring image post processing on an independent workstation and was indeterminate.  LEFT VENTRICLE PLAX 2D LVIDd:         5.10 cm LVIDs:         2.90 cm LV PW:         1.00 cm LV IVS:        1.00 cm LVOT diam:     2.00 cm LV SV:         126 LV SV Index:   57 LVOT Area:     3.14 cm  RIGHT VENTRICLE             IVC RV Basal diam:  3.20 cm     IVC diam: 1.60 cm RV S prime:     14.70 cm/s TAPSE (M-mode): 2.9 cm LEFT ATRIUM             Index        RIGHT ATRIUM           Index LA diam:        3.80 cm 1.73 cm/m   RA Area:     19.90 cm LA Vol (A2C):   78.8 ml 35.82 ml/m  RA Volume:   52.60 ml  23.91 ml/m LA Vol (A4C):   56.8 ml 25.82 ml/m LA Biplane Vol: 67.7 ml 30.78 ml/m  AORTIC VALVE LVOT Vmax:   145.00 cm/s LVOT Vmean:  78.200 cm/s LVOT VTI:    0.402 m  AORTA Ao Root diam: 3.50 cm Ao Asc diam:  3.60 cm MITRAL VALVE                TRICUSPID VALVE MV Area (PHT): 3.12 cm  TR Peak grad:   30.9 mmHg MV Decel Time: 243 msec     TR Vmax:        278.00 cm/s MV E velocity: 132.00 cm/s                             SHUNTS                             Systemic VTI:  0.40 m                             Systemic Diam: 2.00 cm Blake Dooms MD Electronically signed by Blake Dooms MD Signature Date/Time: 08/29/2023/4:27:14 PM    Final    DG Chest Port 1 View Result Date:  08/29/2023 CLINICAL DATA:  Dizziness EXAM: PORTABLE CHEST 1 VIEW COMPARISON:  01/19/2023 FINDINGS: The lungs are clear without focal pneumonia, edema, pneumothorax or pleural effusion. The cardiopericardial silhouette is within normal limits for size. No acute bony abnormality. Telemetry leads overlie the chest. IMPRESSION: No active disease. Electronically Signed   By: Camellia Candle M.D.   On: 08/29/2023 11:31    Microbiology: Results for orders placed or performed during the hospital encounter of 01/19/23  Resp panel by RT-PCR (RSV, Flu A&B, Covid) Anterior Nasal Swab     Status: None   Collection Time: 01/19/23  2:16 PM   Specimen: Anterior Nasal Swab  Result Value Ref Range Status   SARS Coronavirus 2 by RT PCR NEGATIVE NEGATIVE Final    Comment: (NOTE) SARS-CoV-2 target nucleic acids are NOT DETECTED.  The SARS-CoV-2 RNA is generally detectable in upper respiratory specimens during the acute phase of infection. The lowest concentration of SARS-CoV-2 viral copies this assay can detect is 138 copies/mL. A negative result does not preclude SARS-Cov-2 infection and should not be used as the sole basis for treatment or other patient management decisions. A negative result may occur with  improper specimen collection/handling, submission of specimen other than nasopharyngeal swab, presence of viral mutation(s) within the areas targeted by this assay, and inadequate number of viral copies(<138 copies/mL). A negative result must be combined with clinical observations, patient history, and epidemiological information. The expected result is Negative.  Fact Sheet for Patients:  BloggerCourse.com  Fact Sheet for Healthcare Providers:  SeriousBroker.it  This test is no t yet approved or cleared by the [place]  FDA and  has been authorized for detection and/or diagnosis of SARS-CoV-2 by FDA under an Emergency Use Authorization (EUA).  This EUA will remain  in effect (meaning this test can be used) for the duration of the COVID-19 declaration under Section 564(b)(1) of the Act, 21 U.S.C.section 360bbb-3(b)(1), unless the authorization is terminated  or revoked sooner.       Influenza A by PCR NEGATIVE NEGATIVE Final   Influenza B by PCR NEGATIVE NEGATIVE Final    Comment: (NOTE) The Xpert Xpress SARS-CoV-2/FLU/RSV plus assay is intended as an aid in the diagnosis of influenza from Nasopharyngeal swab specimens and should not be used as a sole basis for treatment. Nasal washings and aspirates are unacceptable for Xpert Xpress SARS-CoV-2/FLU/RSV testing.  Fact Sheet for Patients: BloggerCourse.com  Fact Sheet for Healthcare Providers: SeriousBroker.it  This test is not yet approved or cleared by the [place]  FDA and has been authorized for detection and/or diagnosis of SARS-CoV-2 by FDA under an  Emergency Use Authorization (EUA). This EUA will remain in effect (meaning this test can be used) for the duration of the COVID-19 declaration under Section 564(b)(1) of the Act, 21 U.S.C. section 360bbb-3(b)(1), unless the authorization is terminated or revoked.     Resp Syncytial Virus by PCR NEGATIVE NEGATIVE Final    Comment: (NOTE) Fact Sheet for Patients: BloggerCourse.com  Fact Sheet for Healthcare Providers: SeriousBroker.it  This test is not yet approved or cleared by the [place]  FDA and has been authorized for detection and/or diagnosis of SARS-CoV-2 by FDA under an Emergency Use Authorization (EUA). This EUA will remain in effect (meaning this test can be used) for the duration of the COVID-19 declaration under Section 564(b)(1) of the Act, 21 U.S.C. section 360bbb-3(b)(1), unless the authorization is terminated or revoked.  Performed at [facility], [address]  [place], [place] [address]   Blood culture (single)     Status: Abnormal   Collection Time: 01/19/23  3:24 PM   Specimen: BLOOD  Result Value Ref Range Status   Specimen Description   Final    BLOOD BLOOD RIGHT FOREARM Performed at [facility], [address] [place], [place] [address]    Special Requests   Final    BOTTLES DRAWN AEROBIC AND ANAEROBIC Blood Culture results may not be optimal due to an inadequate volume of blood received in culture bottles Performed at [facility], [address] [place], [place] [address]    Culture  Setup Time   Final    GRAM NEGATIVE RODS IN BOTH AEROBIC AND ANAEROBIC BOTTLES CRITICAL RESULT CALLED TO, READ BACK BY AND VERIFIED WITH: JASON ROBBBINS @ 0458 01/20/23 [facility]    Culture KLEBSIELLA PNEUMONIAE (A)  Final   Report Status 01/22/2023 FINAL  Final   Organism ID, Bacteria KLEBSIELLA PNEUMONIAE  Final   Organism ID, Bacteria KLEBSIELLA PNEUMONIAE  Final      Susceptibility   Klebsiella pneumoniae - KIRBY BAUER*    CEFAZOLIN  SENSITIVE Sensitive    Klebsiella pneumoniae - MIC*    AMPICILLIN RESISTANT Resistant     CEFEPIME <=0.12 SENSITIVE Sensitive     CEFTAZIDIME <=1 SENSITIVE Sensitive     CEFTRIAXONE  <=0.25 SENSITIVE Sensitive     CIPROFLOXACIN <=0.25 SENSITIVE Sensitive     GENTAMICIN  <=1 SENSITIVE Sensitive     IMIPENEM <=0.25 SENSITIVE Sensitive     TRIMETH /SULFA  <=20 SENSITIVE Sensitive     AMPICILLIN/SULBACTAM <=2 SENSITIVE Sensitive     PIP/TAZO <=4 SENSITIVE Sensitive ug/mL    * KLEBSIELLA PNEUMONIAE    KLEBSIELLA PNEUMONIAE  Blood Culture ID Panel (Reflexed)     Status: Abnormal   Collection Time: 01/19/23  3:24 PM  Result Value Ref Range Status   Enterococcus faecalis NOT DETECTED NOT DETECTED Final   Enterococcus Faecium NOT DETECTED NOT DETECTED Final   Listeria monocytogenes NOT DETECTED NOT DETECTED Final   Staphylococcus species NOT DETECTED NOT DETECTED Final   Staphylococcus aureus (BCID) NOT  DETECTED NOT DETECTED Final   Staphylococcus epidermidis NOT DETECTED NOT DETECTED Final   Staphylococcus lugdunensis NOT DETECTED NOT DETECTED Final   Streptococcus species NOT DETECTED NOT DETECTED Final   Streptococcus agalactiae NOT DETECTED NOT DETECTED Final   Streptococcus pneumoniae NOT DETECTED NOT DETECTED Final   Streptococcus pyogenes NOT DETECTED NOT DETECTED Final   A.calcoaceticus-baumannii NOT DETECTED NOT DETECTED Final   Bacteroides fragilis NOT DETECTED NOT DETECTED Final   Enterobacterales DETECTED (A) NOT DETECTED Final    Comment: Enterobacterales represent a  large order of gram negative bacteria, not a single organism. CRITICAL RESULT CALLED TO, READ BACK BY AND VERIFIED WITH: JASON ROBBINS @ 0458 01/20/23 [facility]    Enterobacter cloacae complex NOT DETECTED NOT DETECTED Final   Escherichia coli NOT DETECTED NOT DETECTED Final   Klebsiella aerogenes NOT DETECTED NOT DETECTED Final   Klebsiella oxytoca NOT DETECTED NOT DETECTED Final   Klebsiella pneumoniae DETECTED (A) NOT DETECTED Final    Comment: CRITICAL RESULT CALLED TO, READ BACK BY AND VERIFIED WITH: JASON ROBBINS @ 0458 01/20/23 [facility]    Proteus species NOT DETECTED NOT DETECTED Final   Salmonella species NOT DETECTED NOT DETECTED Final   Serratia marcescens NOT DETECTED NOT DETECTED Final   Haemophilus influenzae NOT DETECTED NOT DETECTED Final   Neisseria meningitidis NOT DETECTED NOT DETECTED Final   Pseudomonas aeruginosa NOT DETECTED NOT DETECTED Final   Stenotrophomonas maltophilia NOT DETECTED NOT DETECTED Final   Candida albicans NOT DETECTED NOT DETECTED Final   Candida auris NOT DETECTED NOT DETECTED Final   Candida glabrata NOT DETECTED NOT DETECTED Final   Candida krusei NOT DETECTED NOT DETECTED Final   Candida parapsilosis NOT DETECTED NOT DETECTED Final   Candida tropicalis NOT DETECTED NOT DETECTED Final   Cryptococcus neoformans/gattii NOT DETECTED NOT DETECTED Final   CTX-M ESBL NOT DETECTED  NOT DETECTED Final   Carbapenem resistance IMP NOT DETECTED NOT DETECTED Final   Carbapenem resistance KPC NOT DETECTED NOT DETECTED Final   Carbapenem resistance NDM NOT DETECTED NOT DETECTED Final   Carbapenem resist OXA 48 LIKE NOT DETECTED NOT DETECTED Final   Carbapenem resistance VIM NOT DETECTED NOT DETECTED Final    Comment: Performed at [facility], [address] [place], [place] [address]  Culture, blood (Routine X 2) w Reflex to ID Panel     Status: None   Collection Time: 01/22/23  3:45 PM   Specimen: BLOOD  Result Value Ref Range Status   Specimen Description BLOOD BLOOD RIGHT HAND  Final   Special Requests   Final    BOTTLES DRAWN AEROBIC AND ANAEROBIC Blood Culture results may not be optimal due to an inadequate volume of blood received in culture bottles   Culture   Final    NO GROWTH 5 DAYS Performed at [facility], [address] [place], [place] [address]    Report Status 01/27/2023 FINAL  Final  Culture, blood (Routine X 2) w Reflex to ID Panel     Status: None   Collection Time: 01/22/23  3:52 PM   Specimen: BLOOD  Result Value Ref Range Status   Specimen Description BLOOD LEFT ANTECUBITAL  Final   Special Requests   Final    BOTTLES DRAWN AEROBIC AND ANAEROBIC Blood Culture adequate volume   Culture   Final    NO GROWTH 5 DAYS Performed at [facility], [address] [place], [place] [address]    Report Status 01/27/2023 FINAL  Final    Labs: CBC: Recent Labs  Lab 08/29/23 1032 08/29/23 1244  WBC 4.8 4.0  HGB 14.8 13.4  HCT 46.9 41.7  MCV 89.0 88.9  PLT 154 147*   Basic Metabolic Panel: Recent Labs  Lab 08/29/23 1032 08/29/23 1244  NA 139  --   K 4.3  --   CL 101  --   CO2 27  --   GLUCOSE 121*  --   BUN 13  --   CREATININE 1.29* 1.15  CALCIUM  9.6  --  Liver Function Tests: No results for input(s): AST, ALT, ALKPHOS, BILITOT, PROT, ALBUMIN in the last 168 hours. CBG: No results for  input(s): GLUCAP in the last 168 hours.  Discharge time spent: greater than 30 minutes.  This record has been created using Conservation officer, historic buildings. Errors have been sought and corrected,but may not always be located. Such creation errors do not reflect on the standard of care.   Signed: Amaryllis Dare, MD [facility] 08/30/2023

## 2023-08-30 NOTE — Hospital Course (Addendum)
 Blake Rivera is a 89 y.o. male with medical history significant of hypertension, BPH and prediabetes presented to ED with complaint of dizziness for the last 1 week, he was feeling lightheadedness which got worse when he tried to stand up or ambulate.  Denies any chest pain shortness of breath.  No upper respiratory symptoms.  No nausea, vomiting or diarrhea.  No recent illness.  No urinary symptoms.   Patient went to see his PCP and found to have heart rate in 40s so he was asked to come to ED.  On presentation patient was ound to have bradycardia with heart rate ranging from mid 30s to mid 40s, rest of the vitals stable.  Labs with creatinine of 1.29 with baseline seems to be around 1.  Rest of the labs stable.  Troponin negative. EKG with concern second-degree AV block, type II.   Cardiology was consulted and they are planning to place a pacemaker tomorrow.  8/14: Vitals Blood pressure in the morning, s/p pacemaker placement.  Patient was able to ambulate without any symptoms of dizziness.  Cardiology cleared him for discharge and they will have a close follow-up as outpatient.  Echocardiogram was normal, without any significant abnormality.  Patient will continue on current medications and need to have a close follow-up with his providers for further assistance.

## 2023-08-30 NOTE — ED Notes (Signed)
 Dorothyann Cedar, RN at bedside to transport pt to cath lab. Pt changed into hospital gown. Belongings placed in belongings bag. Pt's cellphone and wallet placed in backpack brought with pt.

## 2023-08-30 NOTE — Discharge Instructions (Signed)
-  No shower until after stitches are removed. -Refrain from playing golf for ten days.

## 2023-08-30 NOTE — Progress Notes (Signed)
 [facility] CLINIC CARDIOLOGY PROGRESS NOTE       Patient ID: Blake Rivera MRN: [id number] DOB/AGE: 87-Oct-1936 87 y.o.  Admit date: 08/29/2023 Referring Physician Dr. Jacolyn Primary Physician [name], DO Primary Cardiologist none Reason for Consultation second-degree AVB, type II  HPI: Blake Rivera is a 89 y.o. male  with a past medical history of hypertension, hyperlipidemia who presented to the ED on 08/29/2023 for worsening intermittent lightheadedness for the past month.  EKG in ED revealed second-degree AVB with heart rates 30-40s.  Patient without significant cardiac history.  Patient denies history of CAD, MI, HF or AF. Cardiology was consulted for further evaluation.   Interval History: -Patient seen and examined this afternoon and laying comfortably in hospital bed. Patient states he feels great and denies lightheadedness/dizziness, CP, or SOB.  -Patients BP elevated and HR stable this afternoon. Per tele ventricular pacing, rate 70s  -Patient remains on room air with stable SpO2.  -Patient ambulated with no concerns.   -Right groin incision site is clean and dry with no evidence of significant swelling, bruising, or active bleeding. Suture in place, patient has outpatient follow-up tomorrow for suture removal.    Review of systems complete and found to be negative unless listed above    Past Medical History:  Diagnosis Date   Diverticulitis of colon    ED (erectile dysfunction)    Hyperglycemia    Hypertension    Vitamin D  deficiency     Past Surgical History:  Procedure Laterality Date   ERCP N/A 01/22/2023   Procedure: ENDOSCOPIC RETROGRADE CHOLANGIOPANCREATOGRAPHY (ERCP);  Surgeon: Jinny Carmine, MD;  Location: [facility] ENDOSCOPY;  Service: Endoscopy;  Laterality: N/A;   HERNIA REPAIR     HOT HEMOSTASIS  01/22/2023   Procedure: HOT HEMOSTASIS (ARGON PLASMA COAGULATION/BICAP);  Surgeon: Jinny Carmine, MD;  Location: [facility] ENDOSCOPY;  Service: Endoscopy;;    REMOVAL OF STONES  01/22/2023   Procedure: REMOVAL OF STONES;  Surgeon: Jinny Carmine, MD;  Location: [facility] ENDOSCOPY;  Service: Endoscopy;;   ROTATOR CUFF REPAIR  03/17/2018   SPHINCTEROTOMY  01/22/2023   Procedure: ANNETT;  Surgeon: Jinny Carmine, MD;  Location: [facility] ENDOSCOPY;  Service: Endoscopy;;    Medications Prior to Admission  Medication Sig Dispense Refill Last Dose/Taking   amLODipine  (NORVASC ) 10 MG tablet Take 1 tablet (10 mg total) by mouth daily. 90 tablet 1 08/29/2023   atorvastatin  (LIPITOR) 10 MG tablet Take 1 tablet (10 mg total) by mouth daily. 90 tablet 3 08/29/2023   ibuprofen  (ADVIL ) 400 MG tablet Take 1 tablet (400 mg total) by mouth every 8 (eight) hours as needed for mild pain (pain score 1-3) or moderate pain (pain score 4-6). 30 tablet 0 Taking As Needed   tamsulosin  (FLOMAX ) 0.4 MG CAPS capsule Take 2 capsules (0.8 mg total) by mouth daily. 180 capsule 1 08/29/2023   Social History   Socioeconomic History   Marital status: Married    Spouse name: Not on file   Number of children: 2   Years of education: Not on file   Highest education level: Doctorate  Occupational History   Occupation: Retired  Tobacco Use   Smoking status: Former    Current packs/day: 0.00    Average packs/day: 0.3 packs/day for 25.0 years (6.3 ttl pk-yrs)    Types: Cigarettes    Start date: 05/17/1989    Quit date: 05/18/2014    Years since quitting: 9.2   Smokeless tobacco: Never   Tobacco comments:    Former smoker -  no need to provide with smoking cessation materials  Vaping Use   Vaping status: Never Used  Substance and Sexual Activity   Alcohol use: Yes    Alcohol/week: 1.0 standard drink of alcohol    Types: 1 Shots of liquor per week    Comment: moderate - 1 whiskey shot per day   Drug use: No   Sexual activity: Yes  Other Topics Concern   Not on file  Social History Narrative   Not on file   Social Drivers of Health   Financial Resource Strain: Low Risk  (03/15/2023)    Overall Financial Resource Strain (CARDIA)    Difficulty of Paying Living Expenses: Not hard at all  Food Insecurity: No Food Insecurity (03/15/2023)   Hunger Vital Sign    Worried About Running Out of Food in the Last Year: Never true    Ran Out of Food in the Last Year: Never true  Transportation Needs: No Transportation Needs (03/15/2023)   PRAPARE - Administrator, Civil Service (Medical): No    Lack of Transportation (Non-Medical): No  Physical Activity: Sufficiently Active (03/15/2023)   Exercise Vital Sign    Days of Exercise per Week: 3 days    Minutes of Exercise per Session: 60 min  Stress: No Stress Concern Present (03/15/2023)   [employer] of Occupational Health - Occupational Stress Questionnaire    Feeling of Stress : Not at all  Social Connections: Socially Integrated (03/15/2023)   Social Connection and Isolation Panel    Frequency of Communication with Friends and Family: More than three times a week    Frequency of Social Gatherings with Friends and Family: Three times a week    Attends Religious Services: More than 4 times per year    Active Member of Clubs or Organizations: Yes    Attends Banker Meetings: More than 4 times per year    Marital Status: Married  Catering manager Violence: Not At Risk (03/15/2023)   Humiliation, Afraid, Rape, and Kick questionnaire    Fear of Current or Ex-Partner: No    Emotionally Abused: No    Physically Abused: No    Sexually Abused: No    Family History  Problem Relation Age of Onset   Heart disease Mother    Aneurysm Mother    Heart disease Brother    Emphysema Father    Healthy Sister    Healthy Brother    Healthy Brother    Heart disease Sister      Vitals:   08/30/23 1100 08/30/23 1130 08/30/23 1200 08/30/23 1300  BP: (!) 155/84 (!) 174/102 (!) 150/80 (!) 143/82  Pulse: 71 73 70 76  Resp: 17 17 16 19   Temp:      TempSrc:      SpO2: 100% 100% 100% 100%  Weight:        PHYSICAL  EXAM General: Well-appearing elderly male, well nourished, in no acute distress. HEENT: Normocephalic and atraumatic. Neck: No JVD.   Lungs: Normal respiratory effort on room air. Clear bilaterally to auscultation. No wheezes, crackles, rhonchi.  Heart: HRRR. Normal S1 and S2 without gallops or murmurs.  Abdomen: Non-distended appearing.  Msk: Normal strength and tone for age. Extremities: Warm and well perfused. No clubbing, cyanosis, edema.  Neuro: Alert and oriented X 3. Psych: Answers questions appropriately.   Labs: Basic Metabolic Panel: Recent Labs    08/29/23 1032 08/29/23 1244  NA 139  --   K 4.3  --  CL 101  --   CO2 27  --   GLUCOSE 121*  --   BUN 13  --   CREATININE 1.29* 1.15  CALCIUM  9.6  --    Liver Function Tests: No results for input(s): AST, ALT, ALKPHOS, BILITOT, PROT, ALBUMIN in the last 72 hours. No results for input(s): LIPASE, AMYLASE in the last 72 hours. CBC: Recent Labs    08/29/23 1032 08/29/23 1244  WBC 4.8 4.0  HGB 14.8 13.4  HCT 46.9 41.7  MCV 89.0 88.9  PLT 154 147*   Cardiac Enzymes: Recent Labs    08/29/23 1032 08/29/23 1244  TROPONINIHS 12 12   BNP: No results for input(s): BNP in the last 72 hours. D-Dimer: No results for input(s): DDIMER in the last 72 hours. Hemoglobin A1C: No results for input(s): HGBA1C in the last 72 hours. Fasting Lipid Panel: No results for input(s): CHOL, HDL, LDLCALC, TRIG, CHOLHDL, LDLDIRECT in the last 72 hours. Thyroid  Function Tests: Recent Labs    08/29/23 1244  TSH 1.749   Anemia Panel: No results for input(s): VITAMINB12, FOLATE, FERRITIN, TIBC, IRON, RETICCTPCT in the last 72 hours.   Radiology: EP PPM/ICD IMPLANT Result Date: 08/30/2023 Successful Medtronic Micra AV 2 leadless pacemaker implantation   ECHOCARDIOGRAM COMPLETE Result Date: 08/29/2023    ECHOCARDIOGRAM REPORT   Patient Name:   CLIVE PARCEL Date of Exam: 08/29/2023  Medical Rec #:  [id number]        Height:       72.0 in Accession #:    [id number]       Weight:       215.6 lb Date of Birth:  22-Jan-1934        BSA:          2.200 m Patient Age:    89 years         BP:           136/64 mmHg Patient Gender: M                HR:           31 bpm. Exam Location:  Inpatient Procedure: 2D Echo (Both Spectral and Color Flow Doppler were utilized during            procedure). Indications:     complete heart block  History:         Patient has prior history of Echocardiogram examinations, most                  recent 01/22/2014. Risk Factors:Hypertension.  Sonographer:     Tinnie Barefoot RDCS Referring Phys:  [id number] CARALYN HUDSON Diagnosing Phys: Marsa Dooms MD IMPRESSIONS  1. Left ventricular ejection fraction, by estimation, is 65 to 70%. The left ventricle has normal function. The left ventricle has no regional wall motion abnormalities. Indeterminate diastolic filling due to E-A fusion.  2. Right ventricular systolic function is normal. The right ventricular size is normal. There is normal pulmonary artery systolic pressure.  3. The mitral valve is normal in structure. Trivial mitral valve regurgitation. No evidence of mitral stenosis.  4. The aortic valve is normal in structure. Aortic valve regurgitation is not visualized. No aortic stenosis is present.  5. The inferior vena cava is normal in size with greater than 50% respiratory variability, suggesting right atrial pressure of 3 mmHg. FINDINGS  Left Ventricle: Left ventricular ejection fraction, by estimation, is 65 to 70%. The left ventricle has normal function. The left ventricle  has no regional wall motion abnormalities. Strain was performed and the global longitudinal strain is indeterminate. The left ventricular internal cavity size was normal in size. There is no left ventricular hypertrophy. Indeterminate diastolic filling due to E-A fusion. Right Ventricle: The right ventricular size is normal. No increase in  right ventricular wall thickness. Right ventricular systolic function is normal. There is normal pulmonary artery systolic pressure. The tricuspid regurgitant velocity is 2.78 m/s, and  with an assumed right atrial pressure of 3 mmHg, the estimated right ventricular systolic pressure is 33.9 mmHg. Left Atrium: Left atrial size was normal in size. Right Atrium: Right atrial size was normal in size. Pericardium: There is no evidence of pericardial effusion. Mitral Valve: The mitral valve is normal in structure. Trivial mitral valve regurgitation. No evidence of mitral valve stenosis. Tricuspid Valve: The tricuspid valve is normal in structure. Tricuspid valve regurgitation is trivial. No evidence of tricuspid stenosis. Aortic Valve: The aortic valve is normal in structure. Aortic valve regurgitation is not visualized. No aortic stenosis is present. Pulmonic Valve: The pulmonic valve was normal in structure. Pulmonic valve regurgitation is not visualized. No evidence of pulmonic stenosis. Aorta: The aortic root is normal in size and structure. Venous: The inferior vena cava is normal in size with greater than 50% respiratory variability, suggesting right atrial pressure of 3 mmHg. IAS/Shunts: No atrial level shunt detected by color flow Doppler. Additional Comments: 3D was performed not requiring image post processing on an independent workstation and was indeterminate.  LEFT VENTRICLE PLAX 2D LVIDd:         5.10 cm LVIDs:         2.90 cm LV PW:         1.00 cm LV IVS:        1.00 cm LVOT diam:     2.00 cm LV SV:         126 LV SV Index:   57 LVOT Area:     3.14 cm  RIGHT VENTRICLE             IVC RV Basal diam:  3.20 cm     IVC diam: 1.60 cm RV S prime:     14.70 cm/s TAPSE (M-mode): 2.9 cm LEFT ATRIUM             Index        RIGHT ATRIUM           Index LA diam:        3.80 cm 1.73 cm/m   RA Area:     19.90 cm LA Vol (A2C):   78.8 ml 35.82 ml/m  RA Volume:   52.60 ml  23.91 ml/m LA Vol (A4C):   56.8 ml 25.82  ml/m LA Biplane Vol: 67.7 ml 30.78 ml/m  AORTIC VALVE LVOT Vmax:   145.00 cm/s LVOT Vmean:  78.200 cm/s LVOT VTI:    0.402 m  AORTA Ao Root diam: 3.50 cm Ao Asc diam:  3.60 cm MITRAL VALVE                TRICUSPID VALVE MV Area (PHT): 3.12 cm     TR Peak grad:   30.9 mmHg MV Decel Time: 243 msec     TR Vmax:        278.00 cm/s MV E velocity: 132.00 cm/s  SHUNTS                             Systemic VTI:  0.40 m                             Systemic Diam: 2.00 cm Marsa Dooms MD Electronically signed by Marsa Dooms MD Signature Date/Time: 08/29/2023/4:27:14 PM    Final    DG Chest Port 1 View Result Date: 08/29/2023 CLINICAL DATA:  Dizziness EXAM: PORTABLE CHEST 1 VIEW COMPARISON:  01/19/2023 FINDINGS: The lungs are clear without focal pneumonia, edema, pneumothorax or pleural effusion. The cardiopericardial silhouette is within normal limits for size. No acute bony abnormality. Telemetry leads overlie the chest. IMPRESSION: No active disease. Electronically Signed   By: Camellia Candle M.D.   On: 08/29/2023 11:31    ECHO as above.  TELEMETRY reviewed by me 08/30/2023: ventricular pacing, rate 70s  EKG reviewed by me: second degree AVB, type 2 rate   Data reviewed by me 08/30/2023: last 24h vitals tele labs imaging I/O hospitalist progress notes.  Principal Problem:   Symptomatic bradycardia Active Problems:   Hypertension   BPH (benign prostatic hyperplasia)   AKI (acute kidney injury) (HCC)   Lightheadedness    ASSESSMENT AND PLAN:  Blake Rivera is a 89 y.o. male  with a past medical history of hypertension, hyperlipidemia who presented to the ED on 08/29/2023 for worsening intermittent lightheadedness for the past month.  EKG in ED revealed second-degree AVB with heart rates 30-40s.  Patient without significant cardiac history.  Patient denies history of CAD, MI, HF or AF. Cardiology was consulted for further evaluation.   # Second degree AVB, type  2 s/p medtronic micra leadless PPM # Lightheadedness, resolved Per tele ventricular pacing, rate 70s.  -Right groin suture in place, patient has outpatient follow-up tomorrow for suture removal.  The patient was brought to the cardiac cath lab and underwent micra leadless pacemaker placement with Dr. Marsa Dooms on 08/14. The patient tolerated with procedure well without complications. The device was interrogated post procedure and is functioning appropriately with good threshold and lead impedance. The incision site was examined and found to be without significant erythema, tenderness to palpation, or apparent aneurysm. Figure of eight suture remains in patient and will be removed tomorrow at outpatient follow-up.The patient was given care instructions and warning symptoms to look out at the incision site.  # Hypertension # Hyperlipidemia -Continue home amlodipine  10 mg daily. -Continue home atorvastatin  10 mg daily.   This patient's plan of care was discussed and created with Dr. Dooms and he is in agreement.  Signed: Dorene Comfort, PA-C  08/30/2023, 1:44 PM [facility] Cardiology

## 2023-08-30 NOTE — ED Notes (Addendum)
 Pt en route to cath lab at this time via stretcher with Dorothyann Cedar, RN.

## 2023-09-04 DIAGNOSIS — Z950 Presence of cardiac pacemaker: Secondary | ICD-10-CM | POA: Insufficient documentation

## 2023-09-04 DIAGNOSIS — I441 Atrioventricular block, second degree: Secondary | ICD-10-CM | POA: Insufficient documentation

## 2023-10-09 ENCOUNTER — Other Ambulatory Visit: Payer: Self-pay | Admitting: Internal Medicine

## 2023-10-09 DIAGNOSIS — E785 Hyperlipidemia, unspecified: Secondary | ICD-10-CM

## 2023-10-10 NOTE — Telephone Encounter (Signed)
 Too soon for refill, LRF 01/29/23 FOR 90 AND 3 RF.    Requested Prescriptions  Pending Prescriptions Disp Refills   atorvastatin  (LIPITOR) 10 MG tablet [Pharmacy Med Name: ATORVASTATIN  10 MG TABLET] 90 tablet 3    Sig: TAKE 1 TABLET BY MOUTH EVERY DAY     Cardiovascular:  Antilipid - Statins Failed - 10/10/2023  3:03 PM      Failed - Lipid Panel in normal range within the last 12 months    Cholesterol, Total  Date Value Ref Range Status  08/26/2014 195 100 - 199 mg/dL Final   Cholesterol  Date Value Ref Range Status  01/29/2023 134 <200 mg/dL Final   LDL Cholesterol (Calc)  Date Value Ref Range Status  01/29/2023 81 mg/dL (calc) Final    Comment:    Reference range: <100 . Desirable range <100 mg/dL for primary prevention;   <70 mg/dL for patients with CHD or diabetic patients  with > or = 2 CHD risk factors. SABRA LDL-C is now calculated using the Martin-Hopkins  calculation, which is a validated novel method providing  better accuracy than the Friedewald equation in the  estimation of LDL-C.  Gladis APPLETHWAITE et al. SANDREA. 7986;689(80): 2061-2068  (http://education.[facility].com/faq/FAQ164)    HDL  Date Value Ref Range Status  01/29/2023 38 (L) > OR = 40 mg/dL Final  91/89/7983 50 >60 mg/dL Final    Comment:    According to ATP-III Guidelines, HDL-C >59 mg/dL is considered a negative risk factor for CHD.    Triglycerides  Date Value Ref Range Status  01/29/2023 73 <150 mg/dL Final         Passed - Patient is not pregnant      Passed - Valid encounter within last 12 months    Recent Outpatient Visits           1 month ago AV heart block   [facility] [name], DO   2 months ago Type 2 diabetes mellitus with hyperglycemia, without long-term current use of insulin  [facility])   [facility] [name], [place]

## 2023-10-12 ENCOUNTER — Ambulatory Visit: Payer: Self-pay

## 2023-10-12 NOTE — Telephone Encounter (Signed)
 FYI Only or Action Required?: FYI only for provider.  Patient was last seen in primary care on 08/29/2023 by [name], DO.  Called Nurse Triage reporting Pacemaker Problem.  Symptoms began about a month ago.  Interventions attempted: Rest, hydration, or home remedies.  Symptoms are: unchanged.  Triage Disposition: See PCP When Office is Open (Within 3 Days)  Patient/caregiver understands and will follow disposition?: Yes  Copied from CRM [id number]. 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  Lightheadedness.  Reason for Disposition  [1] MILD dizziness (e.g., walking normally) AND [2] has NOT been evaluated by doctor (or NP/PA) for this  (Exception: Dizziness caused by heat exposure, sudden standing, or poor fluid intake.)  Answer Assessment - Initial Assessment Questions Pts daughter Garrison called (listed on DPR) regarding onset of mild lightheadedness with pt since new pacemaker insertion on 8/14. No CP, SOB or other symptoms. No shocks from pacemaker. BP 119/62, HR 70, no changes to medications. Taking Amlodipine . Declined scheduling appt with provider Monday d/t schedule conflict. Appt scheduled for Tuesday.  1. DESCRIPTION: Describe your dizziness.     Doesn't feel like self 2. LIGHTHEADED: Do you feel lightheaded? (e.g., somewhat faint, woozy, weak upon standing)     Happens at random, mild 3. VERTIGO: Do you feel like either you or the room is spinning or tilting? (i.e., vertigo)     No 4. SEVERITY: How bad is it?  Do you feel like you are going to faint? Can you stand and walk?     Mild, can stand and walk ok 5. ONSET:  When did the dizziness begin?     Since pacemaker insertion on 08/30/23 6. AGGRAVATING FACTORS: Does anything make it worse? (e.g., standing, change in  head position)     None 7. HEART RATE: Can you tell me your heart rate? How many beats in 15 seconds?  (Note: Not all patients can do this.)       70 8. CAUSE: What do you think is causing the dizziness? (e.g., decreased fluids or food, diarrhea, emotional distress, heat exposure, new medicine, sudden standing, vomiting; unknown)     Possibly related to new pacemaker insertion 08/30/23 9. RECURRENT SYMPTOM: Have you had dizziness before? If Yes, ask: When was the last time? What happened that time?     Not prior to pacemaker. Had an episode of lightheadedness today. 10. OTHER SYMPTOMS: Do you have any other symptoms? (e.g., fever, chest pain, vomiting, diarrhea, bleeding)       No.  Protocols used: Dizziness - Lightheadedness-A-AH

## 2023-10-16 ENCOUNTER — Ambulatory Visit (INDEPENDENT_AMBULATORY_CARE_PROVIDER_SITE_OTHER): Admitting: Nurse Practitioner

## 2023-10-16 ENCOUNTER — Encounter: Payer: Self-pay | Admitting: Nurse Practitioner

## 2023-10-16 ENCOUNTER — Other Ambulatory Visit: Payer: Self-pay

## 2023-10-16 VITALS — BP 146/78 | HR 72 | Temp 98.1°F | Resp 16 | Ht 72.0 in | Wt 216.5 lb

## 2023-10-16 DIAGNOSIS — I10 Essential (primary) hypertension: Secondary | ICD-10-CM

## 2023-10-16 DIAGNOSIS — R42 Dizziness and giddiness: Secondary | ICD-10-CM

## 2023-10-16 LAB — CBC WITH DIFFERENTIAL/PLATELET
Absolute Lymphocytes: 1982 {cells}/uL (ref 850–3900)
Absolute Monocytes: 485 {cells}/uL (ref 200–950)
Basophils Absolute: 10 {cells}/uL (ref 0–200)
Basophils Relative: 0.2 %
Eosinophils Absolute: 192 {cells}/uL (ref 15–500)
Eosinophils Relative: 4 %
HCT: 45.5 % (ref 38.5–50.0)
Hemoglobin: 14.3 g/dL (ref 13.2–17.1)
MCH: 28.4 pg (ref 27.0–33.0)
MCHC: 31.4 g/dL — ABNORMAL LOW (ref 32.0–36.0)
MCV: 90.5 fL (ref 80.0–100.0)
MPV: 9.4 fL (ref 7.5–12.5)
Monocytes Relative: 10.1 %
Neutro Abs: 2131 {cells}/uL (ref 1500–7800)
Neutrophils Relative %: 44.4 %
Platelets: 181 Thousand/uL (ref 140–400)
RBC: 5.03 Million/uL (ref 4.20–5.80)
RDW: 13.6 % (ref 11.0–15.0)
Total Lymphocyte: 41.3 %
WBC: 4.8 Thousand/uL (ref 3.8–10.8)

## 2023-10-16 LAB — COMPREHENSIVE METABOLIC PANEL WITH GFR
AG Ratio: 1.6 (calc) (ref 1.0–2.5)
ALT: 14 U/L (ref 9–46)
AST: 19 U/L (ref 10–35)
Albumin: 4.3 g/dL (ref 3.6–5.1)
Alkaline phosphatase (APISO): 70 U/L (ref 35–144)
BUN: 14 mg/dL (ref 7–25)
CO2: 28 mmol/L (ref 20–32)
Calcium: 9.6 mg/dL (ref 8.6–10.3)
Chloride: 105 mmol/L (ref 98–110)
Creat: 1.21 mg/dL (ref 0.70–1.22)
Globulin: 2.7 g/dL (ref 1.9–3.7)
Glucose, Bld: 97 mg/dL (ref 65–99)
Potassium: 5.3 mmol/L (ref 3.5–5.3)
Sodium: 141 mmol/L (ref 135–146)
Total Bilirubin: 0.5 mg/dL (ref 0.2–1.2)
Total Protein: 7 g/dL (ref 6.1–8.1)
eGFR: 58 mL/min/1.73m2 — ABNORMAL LOW (ref >=60)

## 2023-10-16 LAB — TSH: TSH: 2.33 m[IU]/L (ref 0.40–4.50)

## 2023-10-16 MED ORDER — AMLODIPINE BESYLATE 5 MG PO TABS: 5.0000 mg | ORAL_TABLET | Freq: Every day | ORAL | Status: AC

## 2023-10-16 NOTE — Progress Notes (Addendum)
 BP (!) 146/78   Pulse 72   Temp 98.1 F (36.7 C) (Oral)   Resp 16   Ht 6' (1.829 m)   Wt 216 lb 8 oz (98.2 kg)   SpO2 99%   BMI 29.36 kg/m    Subjective:    Patient ID: Blake Rivera, male    DOB: Jun 11, 1934, 89 y.o.   MRN: [id number]  HPI: Blake Rivera is a 89 y.o. male  Chief Complaint  Patient presents with   Dizziness    Since pacemaker insertion   Discussed the use of AI scribe software for clinical note transcription with the patient, who gave verbal consent to proceed.  History of Present Illness He is an 89 year old male with Mobitz type II second degree AV block and hypertension who presents with mild dizziness.  Dizziness and lightheadedness - Mild dizziness for the past month, described as lightheadedness - Symptoms occur when ambulating, not present when sitting or lying down - No associated chest pain or shortness of breath -reports it just comes and goes, no other associated symptoms -patient reports dizziness improved when he stopped taking his blood pressure medication  Cardiac conduction abnormality and pacemaker - Mobitz type II second degree AV block with prior symptomatic bradycardia - Pacemaker placement on August 30, 2023 - Cardiology follow-up on September 04, 2023 after pacemaker insertion - Recent EKG demonstrates ventricular paced rhythm at 72 bpm  Hypertension and antihypertensive therapy - History of hypertension - Prescribed amlodipine  10 mg daily - Has not taken amlodipine  for the past 3-4 days due to concern for dizziness - Home blood pressure readings during this period range from 135/71 to 150/80 mmHg          07/31/2023    8:09 AM 03/15/2023    1:19 PM 01/18/2023    8:49 AM  Depression screen PHQ 2/9  Decreased Interest 0 0 0  Down, Depressed, Hopeless 0 0 0  PHQ - 2 Score 0 0 0  Altered sleeping  0   Tired, decreased energy  0   Change in appetite  0   Feeling bad or failure about yourself   0   Trouble concentrating   0   Moving slowly or fidgety/restless  0   Suicidal thoughts  0   PHQ-9 Score  0   Difficult doing work/chores  Not difficult at all     Relevant past medical, surgical, family and social history reviewed and updated as indicated. Interim medical history since our last visit reviewed. Allergies and medications reviewed and updated.  Review of Systems  Ten systems reviewed and is negative except as mentioned in HPI      Objective:      BP (!) 146/78   Pulse 72   Temp 98.1 F (36.7 C) (Oral)   Resp 16   Ht 6' (1.829 m)   Wt 216 lb 8 oz (98.2 kg)   SpO2 99%   BMI 29.36 kg/m    Wt Readings from Last 3 Encounters:  10/16/23 216 lb 8 oz (98.2 kg)  08/29/23 215 lb 9.8 oz (97.8 kg)  08/29/23 215 lb 11.2 oz (97.8 kg)    Physical Exam VITALS: P- 72, BP- 160/80 GENERAL: Alert, cooperative, well developed, no acute distress. HEENT: Normocephalic, normal oropharynx, moist mucous membranes. CHEST: Clear to auscultation bilaterally, no wheezes, rhonchi, or crackles. CARDIOVASCULAR: Normal heart rate and rhythm, S1 and S2 normal without murmurs. Blood pressure 160/80 mmHg. ABDOMEN: Soft, non-tender, non-distended, without organomegaly,  normal bowel sounds. EXTREMITIES: No cyanosis or edema. NEUROLOGICAL: Cranial nerves grossly intact, moves all extremities without gross motor or sensory deficit. Motor function normal, strength 5/5. Proprioception intact, negative Romberg. Sensation symmetric and intact.  Results for orders placed or performed during the hospital encounter of 08/29/23  Basic metabolic panel   Collection Time: 08/29/23 10:32 AM  Result Value Ref Range   Sodium 139 135 - 145 mmol/L   Potassium 4.3 3.5 - 5.1 mmol/L   Chloride 101 98 - 111 mmol/L   CO2 27 22 - 32 mmol/L   Glucose, Bld 121 (H) 70 - 99 mg/dL   BUN 13 8 - 23 mg/dL   Creatinine, Ser 8.70 (H) 0.61 - 1.24 mg/dL   Calcium  9.6 8.9 - 10.3 mg/dL   GFR, Estimated 54 (L) >60 mL/min   Anion gap 11 5 - 15  CBC    Collection Time: 08/29/23 10:32 AM  Result Value Ref Range   WBC 4.8 4.0 - 10.5 K/uL   RBC 5.27 4.22 - 5.81 MIL/uL   Hemoglobin 14.8 13.0 - 17.0 g/dL   HCT 53.0 60.9 - 47.9 %   MCV 89.0 80.0 - 100.0 fL   MCH 28.1 26.0 - 34.0 pg   MCHC 31.6 30.0 - 36.0 g/dL   RDW 85.6 88.4 - 84.4 %   Platelets 154 150 - 400 K/uL   nRBC 0.0 0.0 - 0.2 %  Troponin I (High Sensitivity)   Collection Time: 08/29/23 10:32 AM  Result Value Ref Range   Troponin I (High Sensitivity) 12 <18 ng/L  TSH   Collection Time: 08/29/23 12:44 PM  Result Value Ref Range   TSH 1.749 0.350 - 4.500 uIU/mL  CBC   Collection Time: 08/29/23 12:44 PM  Result Value Ref Range   WBC 4.0 4.0 - 10.5 K/uL   RBC 4.69 4.22 - 5.81 MIL/uL   Hemoglobin 13.4 13.0 - 17.0 g/dL   HCT 58.2 60.9 - 47.9 %   MCV 88.9 80.0 - 100.0 fL   MCH 28.6 26.0 - 34.0 pg   MCHC 32.1 30.0 - 36.0 g/dL   RDW 85.6 88.4 - 84.4 %   Platelets 147 (L) 150 - 400 K/uL   nRBC 0.0 0.0 - 0.2 %  Creatinine, serum   Collection Time: 08/29/23 12:44 PM  Result Value Ref Range   Creatinine, Ser 1.15 0.61 - 1.24 mg/dL   GFR, Estimated >39 >39 mL/min  Troponin I (High Sensitivity)   Collection Time: 08/29/23 12:44 PM  Result Value Ref Range   Troponin I (High Sensitivity) 12 <18 ng/L  ECHOCARDIOGRAM COMPLETE   Collection Time: 08/29/23  2:28 PM  Result Value Ref Range   Weight 3,449.76 oz   Height 72 in   BP 136/64 mmHg   S' Lateral 2.90 cm   Area-P 1/2 3.12 cm2   Est EF 65 - 70%           Assessment & Plan:   Problem List Items Addressed This Visit   None Visit Diagnoses       Dizziness    -  Primary   Relevant Orders   EKG 12-Lead   CBC with Differential/Platelet   Comprehensive metabolic panel with GFR   TSH     Essential hypertension       Relevant Medications   amLODipine  (NORVASC ) 5 MG tablet        Assessment and Plan Assessment & Plan Dizziness possibly related to antihypertensive therapy Mild dizziness ongoing for a  month,  possibly related to antihypertensive therapy with amlodipine . Dizziness improved after stopping amlodipine  for 3-4 days. Differential includes other underlying causes. - Reduce amlodipine  dose to 5 mg daily by taking half a tablet. - Order blood work to rule out other underlying causes. - Instruct him to monitor symptoms and send a message if dizziness persists. - Consider pacemaker interrogation if dizziness persists after medication adjustment.  Hypertension Blood pressure elevated at 160/80 mmHg today, likely due to not taking amlodipine  for the past 3-4 days. Home blood pressure readings have been variable, ranging from 135-140/71 to 150/80 mmHg. - Recheck blood pressure before he leaves. - Instruct him to resume amlodipine  at reduced dose of 5 mg daily. - Monitor blood pressure at home and report readings.  Status post pacemaker placement for Mobitz type II AV block Pacemaker placed on 08/30/2023 for Mobitz type II AV block. EKG shows ventricular paced rhythm at 72 bpm, indicating pacemaker is functioning properly. - Recommend he reach out to cardiology for pacemaker interrogation if dizziness persists after medication adjustment.        Follow up plan: Return if symptoms worsen or fail to improve.

## 2023-10-17 ENCOUNTER — Ambulatory Visit

## 2023-10-17 ENCOUNTER — Ambulatory Visit: Payer: Self-pay | Admitting: Nurse Practitioner

## 2023-10-17 DIAGNOSIS — I10 Essential (primary) hypertension: Secondary | ICD-10-CM

## 2023-10-17 NOTE — Progress Notes (Signed)
 Patient is in office today for a nurse visit for Blood Pressure Check. Patient blood pressure was 150/80, Patient No chest pain, No shortness of breath, No dyspnea on exertion, No orthopnea, No paroxysmal nocturnal dyspnea, No edema, No palpitations, No syncope   Hypertension Blood pressure elevated at 160/80 mmHg today, likely due to not taking amlodipine  for the past 3-4 days. Home blood pressure readings have been variable, ranging from 135-140/71 to 150/80 mmHg. - Recheck blood pressure before he leaves. - Instruct him to resume amlodipine  at reduced dose of 5 mg daily. - Monitor blood pressure at home and report readings.   Pt will keep checking and if not better by Monday will let us  know.

## 2023-10-18 ENCOUNTER — Emergency Department
Admission: EM | Admit: 2023-10-18 | Discharge: 2023-10-18 | Disposition: A | Discharge status: Home / Self Care | Attending: Emergency Medicine | Admitting: Emergency Medicine

## 2023-10-18 ENCOUNTER — Other Ambulatory Visit: Payer: Self-pay

## 2023-10-18 DIAGNOSIS — R42 Dizziness and giddiness: Secondary | ICD-10-CM | POA: Diagnosis present

## 2023-10-18 DIAGNOSIS — Z951 Presence of aortocoronary bypass graft: Secondary | ICD-10-CM | POA: Insufficient documentation

## 2023-10-18 DIAGNOSIS — I10 Essential (primary) hypertension: Secondary | ICD-10-CM | POA: Insufficient documentation

## 2023-10-18 LAB — BASIC METABOLIC PANEL WITH GFR
Anion gap: 8 (ref 5–15)
BUN: 16 mg/dL (ref 8–23)
CO2: 27 mmol/L (ref 22–32)
Calcium: 9.3 mg/dL (ref 8.9–10.3)
Chloride: 105 mmol/L (ref 98–111)
Creatinine, Ser: 1.1 mg/dL (ref 0.61–1.24)
GFR, Estimated: >60 mL/min (ref >60)
Glucose, Bld: 138 mg/dL — ABNORMAL HIGH (ref 70–99)
Potassium: 3.8 mmol/L (ref 3.5–5.1)
Sodium: 140 mmol/L (ref 135–145)

## 2023-10-18 LAB — URINALYSIS, ROUTINE W REFLEX MICROSCOPIC
Bilirubin Urine: NEGATIVE
Glucose, UA: NEGATIVE mg/dL
Hgb urine dipstick: NEGATIVE
Ketones, ur: NEGATIVE mg/dL
Leukocytes,Ua: NEGATIVE
Nitrite: NEGATIVE
Protein, ur: NEGATIVE mg/dL
Specific Gravity, Urine: 1.017 (ref 1.005–1.030)
pH: 5 (ref 5.0–8.0)

## 2023-10-18 LAB — CBC
HCT: 46.1 % (ref 39.0–52.0)
Hemoglobin: 14.6 g/dL (ref 13.0–17.0)
MCH: 28.6 pg (ref 26.0–34.0)
MCHC: 31.7 g/dL (ref 30.0–36.0)
MCV: 90.2 fL (ref 80.0–100.0)
Platelets: 165 K/uL (ref 150–400)
RBC: 5.11 MIL/uL (ref 4.22–5.81)
RDW: 14.6 % (ref 11.5–15.5)
WBC: 4.9 K/uL (ref 4.0–10.5)
nRBC: 0 % (ref 0.0–0.2)

## 2023-10-18 LAB — TROPONIN I (HIGH SENSITIVITY): Troponin I (High Sensitivity): 20 ng/L — ABNORMAL HIGH (ref <18)

## 2023-10-18 NOTE — Discharge Instructions (Addendum)
 Please follow-up with your cardiologist for recheck/reevaluation.  Return to the emergency department for any episodes of weakness/dizziness, chest pain, trouble breathing or any other symptom concerning to yourself.

## 2023-10-18 NOTE — ED Triage Notes (Signed)
 Pt to ED for intermittent dizziness x1 week . Denies fall or emesis

## 2023-10-18 NOTE — ED Provider Notes (Signed)
 [facility] Provider Note    Event Date/Time   First MD Initiated Contact with Patient 10/18/23 1052     (approximate)  History   Chief Complaint: Dizziness  HPI  Blake Rivera is a 89 y.o. male with a past medical history of hypertension, recent pacemaker placement who presents to the emergency department for 1 week of intermittent dizziness/lightheadedness.  According to the patient for the past 1 week he has been intermittently experiencing a sensation of lightheadedness.  He states he was having that sensation this morning.  He checked his blood pressure at that time and it was 180 or so which concerned the patient.  He took his morning blood pressure medication and then came to the emergency department for evaluation.  Blood pressure currently 123/86, vital signs are reassuring.  Patient denies any chest pain or shortness of breath.  Patient had a pacemaker placed approximately 1 month ago for symptomatic bradycardia.  Follows up with Dr. Ammon.   Physical Exam   Triage Vital Signs: ED Triage Vitals  Encounter Vitals Group     BP 10/18/23 1043 123/86     Girls Systolic BP Percentile --      Girls Diastolic BP Percentile --      Boys Systolic BP Percentile --      Boys Diastolic BP Percentile --      Pulse Rate 10/18/23 1045 79     Resp 10/18/23 1043 18     Temp 10/18/23 1043 97.6 F (36.4 C)     Temp Source 10/18/23 1043 Oral     SpO2 10/18/23 1045 100 %     Weight 10/18/23 1044 216 lb (98 kg)     Height 10/18/23 1044 6' (1.829 m)     Head Circumference --      Peak Flow --      Pain Score 10/18/23 1044 0     Pain Loc --      Pain Education --      Exclude from Growth Chart --     Most recent vital signs: Vitals:   10/18/23 1043 10/18/23 1045  BP: 123/86   Pulse:  79  Resp: 18   Temp: 97.6 F (36.4 C)   SpO2:  100%    General: Awake, no distress.  CV:  Good peripheral perfusion.  Regular rate and rhythm  Resp:  Normal effort.   Equal breath sounds bilaterally.  Abd:  No distention.  Soft, nontender.  No rebound or guarding.  ED Results / Procedures / Treatments   EKG  EKG viewed and interpreted by myself shows a ventricular paced rhythm at 78 bpm with a widened QRS, normal axis, largely normal intervals with slight QTc prolongation, nonspecific ST changes.  MEDICATIONS ORDERED IN ED: Medications - No data to display   IMPRESSION / MDM / ASSESSMENT AND PLAN / ED COURSE  I reviewed the triage vital signs and the nursing notes.  Patient's presentation is most consistent with acute presentation with potential threat to life or bodily function.  Patient presents to the emergency department for lightheadedness intermittent over the last 1 week.  Patient had a pacemaker placed approximate 1 month ago for symptomatic bradycardia.  This appears to be a [employer] pacemaker.  Will have the pacemaker interrogated.  We will check labs including a CBC chemistry and troponin.  Will obtain a urine sample and continue to closely monitor.  Patient states currently he feels well he has no symptoms denies any  dizziness.  Patient's workup is overall reassuring.  CBC is normal, chemistry is normal.  Urinalysis is just resulted normal as well.  Troponin slightly elevated at 20 but not significantly changed from historical values.  He has no chest pain or other symptoms.  Patient remains symptom-free in the emergency department.  Pacemaker has been interrogated and appears to be functioning properly.  Has the patient feels well I believe the patient will be safe for discharge home with outpatient follow-up with his cardiologist.  Patient agreeable to plan.  FINAL CLINICAL IMPRESSION(S) / ED DIAGNOSES   Dizziness   Note:  This document was prepared using Dragon voice recognition software and may include unintentional dictation errors.   Dorothyann Drivers, MD 10/18/23 1428

## 2023-10-18 NOTE — ED Notes (Signed)
Pt's pacemaker interrogated successfully.

## 2023-11-08 ENCOUNTER — Encounter: Payer: Self-pay | Admitting: Podiatry

## 2023-11-08 ENCOUNTER — Ambulatory Visit: Admitting: Podiatry

## 2023-11-08 DIAGNOSIS — E1165 Type 2 diabetes mellitus with hyperglycemia: Secondary | ICD-10-CM | POA: Diagnosis not present

## 2023-11-08 DIAGNOSIS — M79675 Pain in left toe(s): Secondary | ICD-10-CM

## 2023-11-08 DIAGNOSIS — M79674 Pain in right toe(s): Secondary | ICD-10-CM | POA: Diagnosis not present

## 2023-11-08 DIAGNOSIS — L84 Corns and callosities: Secondary | ICD-10-CM | POA: Diagnosis not present

## 2023-11-08 DIAGNOSIS — B351 Tinea unguium: Secondary | ICD-10-CM

## 2023-11-08 NOTE — Progress Notes (Signed)
 Subjective:  Patient ID: Blake Rivera, male    DOB: 16-Jun-1934,  MRN: [id number]  89 y.o. male presents preventative diabetic foot care and corn(s) of both feet and painful thick toenails that are difficult to trim. Painful toenails interfere with ambulation. Aggravating factors include wearing enclosed shoe gear. Pain is relieved with periodic professional debridement. Painful corns are aggravated when weightbearing when wearing enclosed shoe gear. Pain is relieved with periodic professional debridement. Chief Complaint  Patient presents with   Toe Pain    Dr. Bernardo is his PCP. Denies being diabetic. Last visit was in August   New problem(s): None   PCP is Blake Fend, DO.  Allergies  Allergen Reactions   Lisinopril     Angioedema   Omacor  [Omega-3-Acid Ethyl Esters (Fish)]     Other reaction(s): Rash   Zocor  [Simvastatin]     Muscle Pain    Review of Systems: Negative except as noted in the HPI.   Objective:  Blake Rivera is a pleasant 89 y.o. male WD, WN in NAD. AAO x 3.  Vascular Examination: Vascular status intact b/l with palpable pedal pulses. CFT immediate b/l. Pedal hair present. No edema. No pain with calf compression b/l. Skin temperature gradient WNL b/l. No varicosities noted. No cyanosis or clubbing noted.  Neurological Examination: Sensation grossly intact b/l with 10 gram monofilament. Vibratory sensation intact b/l.  Dermatological Examination: Pedal skin with normal turgor, texture and tone b/l. No open wounds nor interdigital macerations noted. Toenails 1-5 b/l thick, discolored, elongated with subungual debris and pain on dorsal palpation. Hyperkeratotic lesion(s) dorsal PIPJ of bilateral 5th toes.  No erythema, no edema, no drainage, no fluctuance.  Musculoskeletal Examination: Muscle strength 5/5 to b/l LE.  No pain, crepitus noted b/l. HAV with bunion deformity noted b/l LE. Hammertoe(s) left second digit, left fifth digit, right second  digit, and right fifth digit. Patient ambulates independent of any assistive aids.  Radiographs: None  Last A1c:      Latest Ref Rng & Units 07/31/2023    8:19 AM 01/29/2023    9:23 AM  Hemoglobin A1C  Hemoglobin-A1c 4.0 - 5.6 % 6.2  6.4    Assessment:   1. Pain due to onychomycosis of toenails of both feet   2. Corns   3. Type 2 diabetes mellitus with hyperglycemia, without long-term current use of insulin  [facility])     Plan:  Consent given for treatment. Patient examined. All patient's and/or POA's questions/concerns addressed on today's visit. Mycotic toenails 1-5 b/l  debrided in length and girth without incident. Discussed topical application of Vick's Vapor Rub to nails daily. Corns dorsal PIPJ of bilateral 5th toes debrided with dremel tool without incident. Continue foot and shoe inspections daily. Monitor blood glucose per PCP/Endocrinologist's recommendations.Continue soft, supportive shoe gear daily. Report any pedal injuries to medical professional. Call office if there are any quesitons/concerns.  Return in about 9 weeks (around 01/10/2024).  Blake Rivera, DPM      [place] LOCATION: [address], [place] [address]                   Office [phone]   [place] LOCATION: [address] [place],  [place] [address] Office [phone]

## 2024-01-03 ENCOUNTER — Other Ambulatory Visit: Payer: Self-pay | Admitting: Internal Medicine

## 2024-01-03 DIAGNOSIS — E785 Hyperlipidemia, unspecified: Secondary | ICD-10-CM

## 2024-01-05 NOTE — Telephone Encounter (Signed)
 Requested Prescriptions  Pending Prescriptions Disp Refills   atorvastatin  (LIPITOR) 10 MG tablet [Pharmacy Med Name: ATORVASTATIN  10 MG TABLET] 90 tablet 0    Sig: TAKE 1 TABLET BY MOUTH EVERY DAY     Cardiovascular:  Antilipid - Statins Failed - 01/05/2024  9:22 AM      Failed - Lipid Panel in normal range within the last 12 months    Cholesterol, Total  Date Value Ref Range Status  08/26/2014 195 100 - 199 mg/dL Final   Cholesterol  Date Value Ref Range Status  01/29/2023 134 <200 mg/dL Final   LDL Cholesterol (Calc)  Date Value Ref Range Status  01/29/2023 81 mg/dL (calc) Final    Comment:    Reference range: <100 . Desirable range <100 mg/dL for primary prevention;   <70 mg/dL for patients with CHD or diabetic patients  with > or = 2 CHD risk factors. SABRA LDL-C is now calculated using the Martin-Hopkins  calculation, which is a validated novel method providing  better accuracy than the Friedewald equation in the  estimation of LDL-C.  Gladis APPLETHWAITE et al. SANDREA. 7986;689(80): 2061-2068  (http://education.[facility].com/faq/FAQ164)    HDL  Date Value Ref Range Status  01/29/2023 38 (L) > OR = 40 mg/dL Final  91/89/7983 50 >60 mg/dL Final    Comment:    According to ATP-III Guidelines, HDL-C >59 mg/dL is considered a negative risk factor for CHD.    Triglycerides  Date Value Ref Range Status  01/29/2023 73 <150 mg/dL Final         Passed - Patient is not pregnant      Passed - Valid encounter within last 12 months    Recent Outpatient Visits           2 months ago Dizziness   [facility] Gareth Mliss FALCON, FNP   4 months ago AV heart block   [facility] [name], DO   5 months ago Type 2 diabetes mellitus with hyperglycemia, without long-term current use of insulin  [facility])   [facility] [name], [place]

## 2024-01-21 ENCOUNTER — Ambulatory Visit: Admitting: Podiatry

## 2024-01-21 ENCOUNTER — Encounter: Payer: Self-pay | Admitting: Podiatry

## 2024-01-21 DIAGNOSIS — L84 Corns and callosities: Secondary | ICD-10-CM

## 2024-01-21 DIAGNOSIS — E1165 Type 2 diabetes mellitus with hyperglycemia: Secondary | ICD-10-CM | POA: Diagnosis not present

## 2024-01-21 DIAGNOSIS — M79674 Pain in right toe(s): Secondary | ICD-10-CM | POA: Diagnosis not present

## 2024-01-21 DIAGNOSIS — M79675 Pain in left toe(s): Secondary | ICD-10-CM

## 2024-01-21 DIAGNOSIS — B351 Tinea unguium: Secondary | ICD-10-CM

## 2024-01-21 NOTE — Progress Notes (Signed)
"  °  Subjective:  Patient ID: Blake Rivera, male    DOB: February 13, 1934,  MRN: [id number]  Blake Rivera presents to clinic today for preventative diabetic foot care and corn(s) b/l feet and painful mycotic toenails that are difficult to trim. Painful toenails interfere with ambulation. Aggravating factors include wearing enclosed shoe gear. Pain is relieved with periodic professional debridement. Painful corns are aggravated when weightbearing when wearing enclosed shoe gear. Pain is relieved with periodic professional debridement.  Chief Complaint  Patient presents with   Nail Problem    RFC. He denies being diabetic. He saw Dr. Bernardo in Oct.   New problem(s): None.   PCP is [name], DO.  Allergies[1]  Review of Systems: Negative except as noted in the HPI.  Objective: No changes noted in today's physical examination. There were no vitals filed for this visit. Blake Rivera is a pleasant 89 y.o. male obese in NAD. AAO x 3.  Vascular Examination: Vascular status intact b/l with palpable pedal pulses. CFT immediate b/l. Pedal hair present. No edema. No pain with calf compression b/l. Skin temperature gradient WNL b/l. No varicosities noted. No cyanosis or clubbing noted.  Neurological Examination: Sensation grossly intact b/l with 10 gram monofilament. Vibratory sensation intact b/l.  Dermatological Examination: Pedal skin with normal turgor, texture and tone b/l. No open wounds nor interdigital macerations noted. Toenails 1-5 b/l thick, discolored, elongated with subungual debris and pain on dorsal palpation. Hyperkeratotic lesion(s) dorsal PIPJ of bilateral 5th toes.  No erythema, no edema, no drainage, no fluctuance.  Musculoskeletal Examination: Muscle strength 5/5 to b/l LE.  No pain, crepitus noted b/l. HAV with bunion deformity noted b/l LE. Hammertoe(s) left second digit, left fifth digit, right second digit, and right fifth digit. Patient ambulates independent  of any assistive aids.  Radiographs: None  Assessment/Plan: 1. Pain due to onychomycosis of toenails of both feet   2. Corns   3. Type 2 diabetes mellitus with hyperglycemia, without long-term current use of insulin  [facility])   Patient was evaluated and treated. All patient's and/or POA's questions/concerns addressed on today's visit. Toenails 1-5 b/l debrided in length and girth without incident. Corn(s) dorsal PIPJ bilateral 5th digits pared with sharp debridement without incident. Continue foot and shoe inspections daily. Monitor blood glucose per PCP/Endocrinologist's recommendations. Continue soft, supportive shoe gear daily. Report any pedal injuries to medical professional. Call office if there are any questions/concerns. Return in about 9 weeks (around 03/24/2024).  Blake Rivera, DPM      [place] LOCATION: [address], [place] [address]                   Office [phone]   [facility] LOCATION: [address] [place], [place] [address] Office [phone]     [1]  Allergies Allergen Reactions   Lisinopril     Angioedema   Omacor  [Omega-3-Acid Ethyl Esters (Fish)]     Other reaction(s): Rash   Zocor  [Simvastatin]     Muscle Pain   "

## 2024-03-20 ENCOUNTER — Ambulatory Visit: Payer: Medicare Other

## 2024-03-24 ENCOUNTER — Ambulatory Visit: Admitting: Podiatry
